# Patient Record
Sex: Male | Born: 1935 | Race: White | Hispanic: No | Smoking: Never smoker
Health system: Southern US, Community
[De-identification: ages and names within clinical notes are randomized; demographics above are authoritative.]

## PROBLEM LIST (undated history)

## (undated) DIAGNOSIS — F419 Anxiety disorder, unspecified: Secondary | ICD-10-CM

## (undated) DIAGNOSIS — K219 Gastro-esophageal reflux disease without esophagitis: Secondary | ICD-10-CM

## (undated) DIAGNOSIS — Z860100 Personal history of colon polyps, unspecified: Secondary | ICD-10-CM

## (undated) DIAGNOSIS — E079 Disorder of thyroid, unspecified: Secondary | ICD-10-CM

## (undated) DIAGNOSIS — L821 Other seborrheic keratosis: Secondary | ICD-10-CM

## (undated) DIAGNOSIS — H269 Unspecified cataract: Secondary | ICD-10-CM

## (undated) DIAGNOSIS — S329XXA Fracture of unspecified parts of lumbosacral spine and pelvis, initial encounter for closed fracture: Secondary | ICD-10-CM

## (undated) DIAGNOSIS — E039 Hypothyroidism, unspecified: Secondary | ICD-10-CM

## (undated) DIAGNOSIS — H9190 Unspecified hearing loss, unspecified ear: Secondary | ICD-10-CM

## (undated) DIAGNOSIS — E785 Hyperlipidemia, unspecified: Secondary | ICD-10-CM

## (undated) DIAGNOSIS — M1990 Unspecified osteoarthritis, unspecified site: Secondary | ICD-10-CM

## (undated) DIAGNOSIS — N400 Enlarged prostate without lower urinary tract symptoms: Secondary | ICD-10-CM

## (undated) DIAGNOSIS — H16139 Photokeratitis, unspecified eye: Secondary | ICD-10-CM

## (undated) HISTORY — DX: Photokeratitis, unspecified eye: H16.139

## (undated) HISTORY — DX: Hyperlipidemia, unspecified: E78.5

## (undated) HISTORY — DX: Personal history of colonic polyps: Z86.010

## (undated) HISTORY — DX: Disorder of thyroid, unspecified: E07.9

## (undated) HISTORY — PX: ANKLE SURGERY: SHX546

## (undated) HISTORY — PX: SHOULDER SURGERY: SHX246

## (undated) HISTORY — DX: Personal history of colon polyps, unspecified: Z86.0100

## (undated) HISTORY — DX: Unspecified cataract: H26.9

## (undated) HISTORY — DX: Other seborrheic keratosis: L82.1

## (undated) HISTORY — PX: ROTATOR CUFF REPAIR: SHX139

## (undated) HISTORY — PX: WRIST SURGERY: SHX841

## (undated) HISTORY — DX: Unspecified osteoarthritis, unspecified site: M19.90

## (undated) HISTORY — DX: Fracture of unspecified parts of lumbosacral spine and pelvis, initial encounter for closed fracture: S32.9XXA

## (undated) HISTORY — PX: BACK SURGERY: SHX140

## (undated) HISTORY — DX: Gastro-esophageal reflux disease without esophagitis: K21.9

## (undated) HISTORY — PX: CARPAL TUNNEL RELEASE: SHX101

---

## 2000-07-21 ENCOUNTER — Emergency Department (HOSPITAL_COMMUNITY): Admission: EM | Admit: 2000-07-21 | Discharge: 2000-07-22 | Payer: Self-pay | Admitting: Emergency Medicine

## 2000-07-22 ENCOUNTER — Encounter: Payer: Self-pay | Admitting: Emergency Medicine

## 2001-07-22 ENCOUNTER — Encounter: Payer: Self-pay | Admitting: Emergency Medicine

## 2001-07-22 ENCOUNTER — Emergency Department (HOSPITAL_COMMUNITY): Admission: EM | Admit: 2001-07-22 | Discharge: 2001-07-22 | Payer: Self-pay | Admitting: Emergency Medicine

## 2003-05-31 ENCOUNTER — Encounter: Admission: RE | Admit: 2003-05-31 | Discharge: 2003-06-08 | Payer: Self-pay | Admitting: Orthopedic Surgery

## 2003-10-10 ENCOUNTER — Ambulatory Visit (HOSPITAL_COMMUNITY): Admission: RE | Admit: 2003-10-10 | Discharge: 2003-10-10 | Payer: Self-pay | Admitting: Orthopedic Surgery

## 2003-10-10 ENCOUNTER — Ambulatory Visit (HOSPITAL_BASED_OUTPATIENT_CLINIC_OR_DEPARTMENT_OTHER): Admission: RE | Admit: 2003-10-10 | Discharge: 2003-10-10 | Payer: Self-pay | Admitting: Orthopedic Surgery

## 2003-10-13 ENCOUNTER — Ambulatory Visit (HOSPITAL_COMMUNITY): Admission: RE | Admit: 2003-10-13 | Discharge: 2003-10-13 | Payer: Self-pay | Admitting: Neurosurgery

## 2003-11-23 ENCOUNTER — Ambulatory Visit (HOSPITAL_BASED_OUTPATIENT_CLINIC_OR_DEPARTMENT_OTHER): Admission: RE | Admit: 2003-11-23 | Discharge: 2003-11-23 | Payer: Self-pay | Admitting: Orthopedic Surgery

## 2003-12-03 ENCOUNTER — Encounter: Admission: RE | Admit: 2003-12-03 | Discharge: 2004-02-20 | Payer: Self-pay | Admitting: Orthopedic Surgery

## 2004-06-10 ENCOUNTER — Encounter: Admission: RE | Admit: 2004-06-10 | Discharge: 2004-06-10 | Payer: Self-pay | Admitting: Neurosurgery

## 2004-06-26 ENCOUNTER — Encounter: Admission: RE | Admit: 2004-06-26 | Discharge: 2004-06-26 | Payer: Self-pay | Admitting: Neurosurgery

## 2004-07-09 ENCOUNTER — Encounter: Admission: RE | Admit: 2004-07-09 | Discharge: 2004-07-09 | Payer: Self-pay | Admitting: Neurosurgery

## 2004-09-21 ENCOUNTER — Ambulatory Visit (HOSPITAL_COMMUNITY): Admission: RE | Admit: 2004-09-21 | Discharge: 2004-09-21 | Payer: Self-pay | Admitting: Family Medicine

## 2004-10-22 ENCOUNTER — Ambulatory Visit: Payer: Self-pay | Admitting: Family Medicine

## 2004-11-04 ENCOUNTER — Encounter: Admission: RE | Admit: 2004-11-04 | Discharge: 2004-11-04 | Payer: Self-pay | Admitting: Orthopaedic Surgery

## 2004-11-04 ENCOUNTER — Ambulatory Visit (HOSPITAL_BASED_OUTPATIENT_CLINIC_OR_DEPARTMENT_OTHER): Admission: RE | Admit: 2004-11-04 | Discharge: 2004-11-04 | Payer: Self-pay | Admitting: Orthopaedic Surgery

## 2004-11-04 ENCOUNTER — Ambulatory Visit (HOSPITAL_COMMUNITY): Admission: RE | Admit: 2004-11-04 | Discharge: 2004-11-04 | Payer: Self-pay | Admitting: Orthopaedic Surgery

## 2004-11-17 ENCOUNTER — Encounter: Admission: RE | Admit: 2004-11-17 | Discharge: 2005-01-16 | Payer: Self-pay | Admitting: Orthopaedic Surgery

## 2005-01-22 ENCOUNTER — Ambulatory Visit: Payer: Self-pay | Admitting: Family Medicine

## 2005-01-23 ENCOUNTER — Ambulatory Visit (HOSPITAL_COMMUNITY): Admission: RE | Admit: 2005-01-23 | Discharge: 2005-01-23 | Payer: Self-pay | Admitting: Urology

## 2005-07-01 ENCOUNTER — Inpatient Hospital Stay (HOSPITAL_COMMUNITY): Admission: RE | Admit: 2005-07-01 | Discharge: 2005-07-02 | Payer: Self-pay | Admitting: Neurosurgery

## 2005-11-17 ENCOUNTER — Ambulatory Visit (HOSPITAL_COMMUNITY): Admission: RE | Admit: 2005-11-17 | Discharge: 2005-11-17 | Payer: Self-pay | Admitting: Orthopaedic Surgery

## 2005-11-17 ENCOUNTER — Ambulatory Visit (HOSPITAL_BASED_OUTPATIENT_CLINIC_OR_DEPARTMENT_OTHER): Admission: RE | Admit: 2005-11-17 | Discharge: 2005-11-18 | Payer: Self-pay | Admitting: Orthopaedic Surgery

## 2005-12-08 ENCOUNTER — Encounter: Admission: RE | Admit: 2005-12-08 | Discharge: 2006-01-06 | Payer: Self-pay | Admitting: Orthopaedic Surgery

## 2006-01-07 ENCOUNTER — Encounter: Admission: RE | Admit: 2006-01-07 | Discharge: 2006-02-04 | Payer: Self-pay | Admitting: Orthopaedic Surgery

## 2006-02-05 ENCOUNTER — Encounter: Admission: RE | Admit: 2006-02-05 | Discharge: 2006-02-26 | Payer: Self-pay | Admitting: Orthopaedic Surgery

## 2006-03-19 ENCOUNTER — Encounter: Admission: RE | Admit: 2006-03-19 | Discharge: 2006-03-19 | Payer: Self-pay | Admitting: Neurosurgery

## 2006-03-27 ENCOUNTER — Emergency Department (HOSPITAL_COMMUNITY): Admission: EM | Admit: 2006-03-27 | Discharge: 2006-03-27 | Payer: Self-pay | Admitting: Emergency Medicine

## 2006-04-01 ENCOUNTER — Encounter: Admission: RE | Admit: 2006-04-01 | Discharge: 2006-04-01 | Payer: Self-pay | Admitting: Neurosurgery

## 2006-04-06 ENCOUNTER — Ambulatory Visit (HOSPITAL_COMMUNITY): Admission: RE | Admit: 2006-04-06 | Discharge: 2006-04-07 | Payer: Self-pay | Admitting: Ophthalmology

## 2006-07-24 ENCOUNTER — Emergency Department (HOSPITAL_COMMUNITY): Admission: EM | Admit: 2006-07-24 | Discharge: 2006-07-24 | Payer: Self-pay | Admitting: Emergency Medicine

## 2006-10-21 ENCOUNTER — Emergency Department (HOSPITAL_COMMUNITY): Admission: EM | Admit: 2006-10-21 | Discharge: 2006-10-21 | Payer: Self-pay | Admitting: Emergency Medicine

## 2006-11-15 ENCOUNTER — Observation Stay (HOSPITAL_COMMUNITY): Admission: EM | Admit: 2006-11-15 | Discharge: 2006-11-16 | Payer: Self-pay | Admitting: Emergency Medicine

## 2006-11-15 ENCOUNTER — Ambulatory Visit: Payer: Self-pay | Admitting: Cardiology

## 2006-11-17 ENCOUNTER — Ambulatory Visit: Payer: Self-pay

## 2006-11-22 ENCOUNTER — Inpatient Hospital Stay (HOSPITAL_COMMUNITY): Admission: EM | Admit: 2006-11-22 | Discharge: 2006-11-24 | Payer: Self-pay | Admitting: Emergency Medicine

## 2006-11-22 ENCOUNTER — Ambulatory Visit: Payer: Self-pay | Admitting: Cardiology

## 2006-12-06 ENCOUNTER — Ambulatory Visit (HOSPITAL_COMMUNITY): Admission: RE | Admit: 2006-12-06 | Discharge: 2006-12-06 | Payer: Self-pay | Admitting: Neurosurgery

## 2007-01-13 ENCOUNTER — Ambulatory Visit (HOSPITAL_COMMUNITY): Admission: RE | Admit: 2007-01-13 | Discharge: 2007-01-13 | Payer: Self-pay | Admitting: Family Medicine

## 2007-01-27 ENCOUNTER — Ambulatory Visit (HOSPITAL_COMMUNITY): Admission: RE | Admit: 2007-01-27 | Discharge: 2007-01-27 | Payer: Self-pay | Admitting: Family Medicine

## 2007-01-31 ENCOUNTER — Encounter: Admission: RE | Admit: 2007-01-31 | Discharge: 2007-02-28 | Payer: Self-pay | Admitting: Neurosurgery

## 2007-02-09 ENCOUNTER — Ambulatory Visit: Payer: Self-pay | Admitting: Cardiology

## 2007-05-23 ENCOUNTER — Ambulatory Visit: Payer: Self-pay | Admitting: Cardiology

## 2007-06-06 ENCOUNTER — Ambulatory Visit: Payer: Self-pay

## 2007-06-06 ENCOUNTER — Encounter: Payer: Self-pay | Admitting: Cardiology

## 2007-08-01 ENCOUNTER — Ambulatory Visit (HOSPITAL_COMMUNITY): Admission: RE | Admit: 2007-08-01 | Discharge: 2007-08-01 | Payer: Self-pay | Admitting: Family Medicine

## 2007-08-15 ENCOUNTER — Ambulatory Visit: Payer: Self-pay | Admitting: Gastroenterology

## 2007-10-05 ENCOUNTER — Ambulatory Visit: Payer: Self-pay | Admitting: Gastroenterology

## 2008-01-26 ENCOUNTER — Encounter: Admission: RE | Admit: 2008-01-26 | Discharge: 2008-02-16 | Payer: Self-pay | Admitting: Orthopaedic Surgery

## 2008-12-06 DIAGNOSIS — Z8669 Personal history of other diseases of the nervous system and sense organs: Secondary | ICD-10-CM | POA: Insufficient documentation

## 2008-12-06 DIAGNOSIS — R109 Unspecified abdominal pain: Secondary | ICD-10-CM | POA: Insufficient documentation

## 2008-12-06 DIAGNOSIS — M1990 Unspecified osteoarthritis, unspecified site: Secondary | ICD-10-CM

## 2008-12-06 DIAGNOSIS — M549 Dorsalgia, unspecified: Secondary | ICD-10-CM | POA: Insufficient documentation

## 2008-12-06 HISTORY — DX: Personal history of other diseases of the nervous system and sense organs: Z86.69

## 2009-04-05 ENCOUNTER — Ambulatory Visit: Payer: Self-pay | Admitting: Vascular Surgery

## 2009-04-24 ENCOUNTER — Encounter: Admission: RE | Admit: 2009-04-24 | Discharge: 2009-04-24 | Payer: Self-pay | Admitting: Neurosurgery

## 2010-12-27 ENCOUNTER — Encounter: Payer: Self-pay | Admitting: Family Medicine

## 2011-03-19 ENCOUNTER — Encounter: Payer: Self-pay | Admitting: Physician Assistant

## 2011-03-19 DIAGNOSIS — N400 Enlarged prostate without lower urinary tract symptoms: Secondary | ICD-10-CM

## 2011-03-19 DIAGNOSIS — E785 Hyperlipidemia, unspecified: Secondary | ICD-10-CM | POA: Insufficient documentation

## 2011-03-19 DIAGNOSIS — K219 Gastro-esophageal reflux disease without esophagitis: Secondary | ICD-10-CM

## 2011-03-19 DIAGNOSIS — N319 Neuromuscular dysfunction of bladder, unspecified: Secondary | ICD-10-CM

## 2011-04-21 NOTE — Procedures (Signed)
LOWER EXTREMITY ARTERIAL EVALUATION-EXERCISE WORKSHEET   INDICATION:  Bilateral lower extremity claudication.   HISTORY:  Diabetes:  No.  Cardiac:  No.  Hypertension:  No.  Smoking:  No.  Previous Surgery:  No.   RESTING SYSTOLIC PRESSURES: (ABI)                                RIGHT       LEFT  Brachial:                     126         122  Thigh:  Anterior tibial at ankle:     148 (>1.0)  140 (>1.0)  Posterior tibial at ankle:    148         140  Peroneal at ankle:  DOPPLER WAVEFORM ANALYSIS:  Common femoral:  Popliteal:  Anterior tibial:              Triphasic   Triphasic  Posterior tibial:             Triphasic   Triphasic  Peroneal:   EXERCISE  Walking Time:  Five minutes  Speed:  Incline:  Ambulated In Hallway:  X   SYMPTOMS:  Bilateral calf tingling with 1 minute of exercise.  IMPRESSION:  1.  Resting ABI suggests no significant arterial occlusive  disease.  2.  Post exercise indices also were >1.0 bilaterally suggestive of no  significant lower extremity arterial occlusive disease.   POST-EXERCISE  RIGHT                      LEFT  128        156             immediately 164  152        134             2 min.      144             4 min.             6 min.             8 min.             10 min.       ___________________________________________  Quita Skye. Hart Rochester, M.D.   MC/MEDQ  D:  04/05/2009  T:  04/05/2009  Job:  [id number]

## 2011-04-21 NOTE — Assessment & Plan Note (Signed)
[facility]                            CARDIOLOGY OFFICE NOTE   JUBAL, RADEMAKER                     MRN:          [id number]  DATE:05/23/2007                            DOB:          [birth year]    PRIMARY CARE PHYSICIAN:  Dr. Molly Maduro Day.   REASON FOR PRESENTATION:  Evaluate patient with chest pain and mitral  regurgitation.   HISTORY OF PRESENT ILLNESS:  The patient presents for followup prior to  going back to [place].  He wanted to see me one last time prior to  going there for several months.  He has coronary disease,  nonobstructive, as described below.  He also has a vague history of  mitral regurgitation or other valvular abnormalities.  He has been  taking endocarditis prophylaxis for quite a while for this.  Currently,  he denies any symptoms.  He gets along very well.  He denies any chest  discomfort, neck discomfort, arm discomfort, activity-induced nausea,  vomiting, or excessive diaphoresis.  He has had no palpitations, no  presyncope or syncope.  He denies any PND or orthopnea.   PAST MEDICAL HISTORY:  1. Coronary artery disease, nonobstructive (catheterization, December      2007, LAD no obstructive disease, but 38%-48% proximal stenosis and      38% mid stenosis.  Circumflex had 20% stenosis.  The right coronary      artery 30% stenosis.  The EF was 55%-60%).  2. Dyslipidemia.  3. Peptic ulcer disease.  4. Degenerative joint disease.  5. Osteoarthritis.  6. Low back pain.  7. Rotator cuff repair.  8. Laminectomy.  9. Carpal tunnel surgery.   ALLERGIES:  None.   MEDICATIONS:  1. Aspirin 81 mg daily.  2. Gabapentin 600 mg b.i.d.  3. Vytorin 10/40 three times per week.  4. Protonix 40 mg daily.  5. Lisinopril 5 mg daily.  6. Celebrex 200 mg b.i.d.  7. Fish oil.  8. Flax seed oil.  9. Super B complex.   REVIEW OF SYSTEMS:  As stated in the HPI and otherwise negative for  other systems.   PHYSICAL  EXAMINATION:  Patient is in no distress.  Blood pressure 106/58, heart rate 80 and regular, weight 183 pounds.  HEENT:  Eyelids unremarkable, pupils are equal, round, and reactive to  light, fundi not visualized.  Oral mucosa unremarkable.  NECK:  No jugular venous distension at 45 degrees, carotid upstroke  brisk and symmetrical.  No bruit.  No thyromegaly.  LYMPHATICS:  No adenopathy.  LUNGS:  Clear to auscultation bilaterally.  BACK:  No costovertebral angle tenderness.  CHEST:  Unremarkable.  HEART:  PMI not displaced or sustained.  S1 and S2 are within normal  limits.  No S3, no S4.  No clicks, no rubs, no murmurs.  ABDOMEN:  Flat, positive bowel sounds, normal in frequency and pitch.  No bruits, no rebound, no guarding.  No midline pulsatile mass.  No  hepatomegaly, no splenomegaly.  SKIN:  No rashes, no nodules.  EXTREMITIES:  2+ pulses throughout, no edema.  No cyanosis, no clubbing.  NEURO:  Oriented  to person, place, and time.  Cranial nerves II-XII  grossly intact, motor grossly intact.   ASSESSMENT AND PLAN:  1. Coronary disease.  The patient is having no symptoms.  He will      continue with primary risk reduction.  2. Mitral regurgitation.  I do not hear any murmur.  He was to get an      echo, but this never happened after the last visit.  I am going to      arrange for this to happen to clarify whether he has this or not.      He would not need endocarditis prophylaxis based on current data      and guidelines.  3. Followup could be back in this clinic as needed, and based on the      results of the echo.     Rollene Rotunda, MD, [facility]  Electronically Signed    JH/MedQ  DD: 05/23/2007  DT: 05/24/2007  Job #: [id number]   cc:   Alfredia Client, MD

## 2011-04-21 NOTE — Assessment & Plan Note (Signed)
NAME:  Michael Mcclure, Michael Mcclure               CHART#:  [id number]   DATE:  08/15/2007                       DOB:  06-26-1932   PRIMARY PHYSICIAN:  Monica Becton, M.D.   REASON FOR VISIT:  Abdominal pain.   HISTORY OF PRESENT ILLNESS:  Michael Mcclure is a 77 year old male who has  been having pain in his right upper and right lower quadrant since the  beginning of the year.  He describes it as a burning sensation.  It is  not really related to eating.  It does not move anywhere.  He did have  some increase in pain with pizza and only pizza.  He has a bowel  movement once a day, and half the time he has to strain to push the  stool out.  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  He is active even in the wintertime.  He denies any blood in  his urine, and he attributes any discomfort with urination to his  pinched nerve in his back.  Sometimes, the pain keeps him awake, but it  is not a bad pain.  He denies it being knife-like, twisting, or  crampy.  He has had no nausea, vomiting, or blood in his stool.  Sometimes he is sitting around, and he feels the pain.  It can be worse  with movement.  He walks 1-1/2 to 2 miles daily.  The pain in always in  the same place.  He denies any diarrhea or loose stool.  He reports  having a colonoscopy at [facility] in [place], [place]  [place], and they told him everything was alright.   PAST MEDICAL HISTORY:  1. Back pain.  2. Arthritis.   PAST SURGICAL HISTORY:  1. Rotator cuff surgery.  2. Back surgery last year.  3. Carpal tunnel syndrome surgery x2.   ALLERGIES:  No known drug allergies.   MEDICATIONS:  1. Neurontin 600 mg twice daily.  2. Protonix 40 mg daily.  3. Tylenol Arthritis twice daily.  4. Aspirin 81 mg q.h.s.  5. Flax oil.  6. Fish oil.  7. Vytorin 10/40.   FAMILY HISTORY:  His mother had colon cancer after the age of 21  and  lived to be 64.   SOCIAL HISTORY:  He is separated for the last 8-9 years.  He is visiting  his son and grandchildren from [place].  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  He denies any tobacco or alcohol  use.   REVIEW OF SYSTEMS:  Per the HPI.  Otherwise, all systems are negative.   PHYSICAL EXAM:  Weight 185 pounds, height 5 feet 11 inches, temperature  97.7, blood pressure 120/68, pulse 72.  GENERAL:  He is in no apparent distress, alert and oriented x4.  HEENT:  Atraumatic, normocephalic, pupils equal and reactive to light.  Mouth no oral lesions.  Posterior pharynx without erythema or exudate.  NECK:  Full range of motion, no lymphadenopathy.  LUNGS:  Clear to auscultation bilaterally.  CARDIOVASCULAR:  Regular rhythm, no murmur, normal S1 and S2.  ABDOMEN:  Bowel sounds are present, soft, nondistended.  He has  tenderness to palpation in the right periumbilical region which  reproduces his complaint.  It is unchanged with Carnett's maneuvers.  He  has a visible bulge in his abdomen, which decreases with relaxation of  his Valsalva maneuvers.  The bulge is nontender.  EXTREMITIES:  Without cyanosis, clubbing, or edema.  NEURO:  He has no focal neurologic deficits.   RADIOGRAPHIC STUDIES:  1. Abdominal ultrasound shows a normal gallbladder without stones or      wall thickening.  Bile duct is 3 mm.  His pancreas is normal.  He      had no sonographic abnormalities identified in his right lower      quadrant to suggest a hernia.  I discussed with the reviewing      physician Dr. Ulyses Southward.  2. CT scan of the abdomen and pelvis with contrast.  It was negative      for any mass, adenopathy, bowel abnormalities, or abnormalities of      the appendix.  He did have atherosclerotic changes in a nondilated      abdominal aorta.   ASSESSMENT:  Michael Mcclure is a 77 year old male with chronic right  periumbilical abdominal pain associated with a  midline bulge likely  secondary to separation of his rectus sheath muscles due to a lifetime  history of manual labor.  The differential diagnosis includes abdominal  wall pain and a low likelihood of mesenteric ischemia.  He has a low  likelihood as well of having a biliary source for his periumbilical  pain.  I will also make recommendations to help him with his mild  constipation. Thanks for allowing me to see Michael Mcclure in  consultation.  My recommendations follow.   RECOMMENDATIONS:  1. He is given his instructions for his abdominal pain and hard bowel      movements in writing.  2. He is asked to add ibuprofen 1 twice a day x10 days.  3. He is asked to apply heat 3 times a day to his abdominal wall.  He      may use Tylenol Arthritis for additional pain if needed.  4. For his hard bowel movements, he is asked to drink at least 6-8      cups of coffee, water, or juice per day.  He should follow a high      fiber diet.  He is given a handout on a high fiber diet.      He should also add stool softeners 2 times a day to achieve 1 soft      bowel movement daily.  5. He has a return patient visit to see me in 1 month.       Kassie Mends, M.D.  Electronically Signed     SM/MEDQ  D:  08/15/2007  T:  08/16/2007  Job:  [id number]   cc:   Ernestina Penna, M.D.

## 2011-04-21 NOTE — Assessment & Plan Note (Signed)
NAME:  Michael Mcclure, Michael Mcclure               CHART#:  [id number]   DATE:  10/05/2007                       DOB:  13-May-1932   REFERRING PHYSICIAN:  Monica Becton.   PROBLEM LIST:  1. Chronic abdominal pain.  2. Back pain.  3. Arthritis.  4. Carpal tunnel surgery.   SUBJECTIVE:  Michael Mcclure is a 77 year old male who presents with  continued abdominal pain.  Michael Mcclure has had several episodes of burning in his  right periumbilical area, more up into the right upper quadrant.  Michael Mcclure  said the ibuprofen did not work.  It is a pinpoint area and the pain has  not moved anywhere.  Is a burning sensation.  It is not associated with  fever, nausea or vomiting.  Did have an episode of indigestion on  Saturday with pizza and spaghetti.  His bowel movements have become more  regular with FiberOne, prunes and raisins.  The pain is random in onset.  Sometimes it hurts more than others.  Sometimes Michael Mcclure does not have the  pain at all.  Michael Mcclure has not had any weight loss.  His appetite is good.  Eating does not really bring on the pain.  Walking around can start the  pain.  Michael Mcclure does several repetitive activities during the daytime such as  cutting grass, cutting out tree stumps and garden work.  Michael Mcclure has also  recently picked a half a bushel of beans.  Michael Mcclure notices it more when Michael Mcclure is  sitting than when Michael Mcclure is moving around.  Michael Mcclure states his bowel problem is  fixed but his pain is the same.   MEDICATIONS:  1. Neurontin 600 mg b.i.d.  2. Protonix 40 mg daily.  3. Tylenol twice a day.  4. Aspirin 81 mg at nighttime.  5. Flax seed oil.  6. Fish oil.  7. Vytorin 10/40 daily.   OBJECTIVE:  Weight 187 pounds (up 2 pounds since September 2008), height  5 foot 11, BMI 21.1 (slightly overweight), temperature 98.5, blood  pressure 136/76, pulse 60.  GENERAL:  Michael Mcclure is in no apparent distress, alert and oriented x4.  LUNGS:  Clear to auscultation bilaterally.  CARDIOVASCULAR:  Regular rhythm, no murmur, normal S1 and S2.  ABDOMEN:  Bowel sounds are present, soft, pinpoint tenderness in the  right upper quadrant just lateral to his umbilicus.  This is the pain  that initiated at visit.  Michael Mcclure has a midline bulge with raising his legs  off the exam table.  Bulge reduces once Michael Mcclure no longer Valsalva's.  Michael Mcclure has  no rebound or guarding.  The remaining abdominal exam is benign.  NEURO:  Michael Mcclure has no focal neurologic deficits.   ASSESSMENT:  Mr. Rodas is a 77 year old male who continues to  complain of persistent burning in his right upper quadrant.  Michael Mcclure had an  ileocolonoscopy in [place] in April of 2008 and lab evaluation to  include CBC and comprehensive metabolic panel which was absolutely  normal.  Michael Mcclure had an ultrasound in August 2008 which showed a normal  gallbladder and common bile duct.  Michael Mcclure had a CT scan in February 2008  performed with intravenous contrast for abdominal pain and again his  liver, spleen, gallbladder and pancreas were normal.  His colon was  normal.  The differential diagnosis for his abdominal pain includes  musculoskeletal abdominal wall pain, and a low likelihood of abdominal  pain secondary to a hernia in this location, or referred pain from disk  disease.  Thank you for allowing me to see Mr. Scribner in consultation.  My recommendations follow.   RECOMMENDATIONS:  1. Have given him a referral to physical therapy so that Michael Mcclure can have      iontophoresis with 0.40% dexamethasone.  If his abdominal pain      continues then could consider MRI of the spine to look for disk      disease.  2. A follow up appointment to see me in 8 weeks.       Kassie Mends, M.D.  Electronically Signed     SM/MEDQ  D:  10/05/2007  T:  10/05/2007  Job:  [id number]   cc:   Ernestina Penna, M.D.

## 2011-04-24 NOTE — H&P (Signed)
NAME:  Michael Mcclure, Michael Mcclure NO.:  [id number]   MEDICAL RECORD NO.:  [id number]          PATIENT TYPE:  OBV   LOCATION:  1823                         FACILITY:  [facility]   PHYSICIAN:  Lowella Bandy, MD      DATE OF BIRTH:  Aug 19, 1932   DATE OF ADMISSION:  11/15/2006  DATE OF DISCHARGE:                              HISTORY & PHYSICAL   PRIMARY CARE PHYSICIAN:  Magnus Sinning. Rice, M.D. locally; Dr. Kalman Jewels  (question spelling) in [place] at [phone].   PRIMARY CARDIOLOGIST:  Dr. Antony Blackbird in [place] at [phone].   CHIEF COMPLAINT:  Shortness of breath.   HISTORY OF PRESENT ILLNESS:  Michael Mcclure is a 77 year old male with  hyperlipidemia but no known history of obstructive coronary artery  disease who has been having periods of shortness of breath for the past  few months.  These episodes last just a few seconds at a time and occur  randomly, and are not related in any way that he can determine to  exertion.  They often happen while he is sitting watching television.  He also occasionally has sharp left-sided chest pain.  The chest pain is  not exertional and also lasts just a few seconds before resolving.  He  has been in the [place] area visiting his son, and he has had a few  of these episodes of shortness of breath while here. He called Dr.  Montey Hora and was instructed to present to the emergency department  for further evaluation by [facility].   PAST MEDICAL HISTORY:  1. Hyperlipidemia.  2. Peptic ulcer disease.  3. DJD.  4. Osteoarthritis.  5. Low back pain due to pinched nerve.  6. Status post right rotator cuff repair.  7. Status post laminectomy in July 2006.  8. Carpal tunnel syndrome.   PAST CARDIAC STUDIES:  Reports that he had cardiac catheterization in  [place], [place] approximately 15 years ago.  He had an  exercise stress echocardiogram in [place] earlier this year, he  believes in May or June  2007.   MEDICATIONS:  1. Aspirin 81 mg p.o. once a day.  2. Vitorin 10/40 once a day.  3. Gabapentin 600 mg b.i.d.  4. Clarinex as needed.  5. Protonix 40 mg p.o. once a day.  6. Lisinopril 5 mg p.o. once a day.  7. Sulindac 200 mg p.o. once a day.  8. Fish oil.  9. Nitroglycerin 0.4 mg sublingually p.r.n. prescribed by Dr.      Antony Blackbird, although no known history of obstructive coronary artery      disease.   ALLERGIES:  No known drug allergies.   SOCIAL HISTORY:  He lives in [place].  He is a retired Forensic scientist.  He denies any history of tobacco use.  He also denies alcohol or illicit  drug use.  He exercises frequently and walks up to a mile without  difficulty.   FAMILY HISTORY:  Mother died at age 98 from cancer.  Father died at age  55 from complications of congestive heart failure.  He had his  first MI  in his 53s.   REVIEW OF SYSTEMS:  Positive for hearing loss for which he wears hearing  aids. The patient also has dentures.  Positive for occasional cough,  dysuria, arthralgias, and back pain.  Otherwise, 10 systems reviewed and  negative other than as noted above in the HPI.   PHYSICAL EXAMINATION:  VITAL SIGNS: Blood pressure 117/63, pulse 80,  respirations 18, oxygen saturation 96% on room air. Temperature 97.5.  GENERAL:  The patient is well appearing, breathing in no apparent  distress.  HEENT:  Normocephalic and atraumatic.  Sclerae anicteric.  Oropharynx  clear.  Dentures in place.  NECK: Supple.  No masses appreciated.  No JVD or carotid bruits.  CARDIOVASCULAR:  Regular rate and rhythm.  Normal S1 and S2. No murmurs,  rubs, or gallops appreciated.  CHEST: Clear to auscultation bilaterally.  ABDOMEN: Soft, nontender, nondistended.  Normal bowel sounds.  EXTREMITIES:  Warm, no edema.  SKIN:  No rashes.  MUSCULOSKELETAL: No joint effusions or erythema.  NEUROLOGIC: Alert and oriented x3.  Cranial nerves grossly intact.  Moving all extremities well.    EKG, which I personally reviewed, shows sinus rhythm at 80 beats per  minute, normal EKG.   Laboratory values pending.   Chest x-ray pending.   ASSESSMENT AND PLAN:  [name] male with cardiac risk factors of  hyperlipidemia presenting with atypical chest pain and shortness of  breath, with a normal electrocardiogram. Cardiac enzymes are still  pending at this point.  Likelihood of acute coronary syndrome appears  low at this point.   1. We will admit him for observation to rule out myocardial      infarction. We will place him on 325 mg of aspirin and continue his      statin therapy. We will not heparinize him given the low      probability of acute coronary syndrome unless we see EKG changes or      elevation in cardiac enzymes.   1. We will attempt to obtain records from his prior stress test      earlier this year in [place] and, if possible, his prior      catheterization records as well.   1. If he rules out for MI, he likely could be discharged tomorrow      morning, either with outpatient followup here or back at home in      [place].      Lowella Bandy, MD  Electronically Signed     JJC/MEDQ  D:  11/15/2006  T:  11/15/2006  Job:  [id number]

## 2011-04-24 NOTE — Op Note (Signed)
NAME:  Michael Mcclure, Michael Mcclure              ACCOUNT NO.:  [id number]   MEDICAL RECORD NO.:  [id number]          PATIENT TYPE:  OIB   LOCATION:  5707                         FACILITY:  [facility]   PHYSICIAN:  Beulah Gandy. Ashley Royalty, M.D. DATE OF BIRTH:  1932/06/26   DATE OF PROCEDURE:  04/06/2006  DATE OF DISCHARGE:                                 OPERATIVE REPORT   PREOPERATIVE DIAGNOSIS:  Retinal break, vitreous hemorrhage, and posterior  retinal membranes.   POSTOPERATIVE DIAGNOSIS:  Retinal break, vitreous hemorrhage, and posterior  retinal membranes.   PROCEDURES:  Pars plana vitrectomy with membrane peel, gas fluid exchange,  retinal photocoagulation, left eye.   SURGEON:  Beulah Gandy. Ashley Royalty, M.D.   ASSISTANT:  Rosalie Doctor, MA   ANESTHESIA:  General.   DETAILS:  Usual prep and drape, 25 gauge trocars at 10, 2, and 4 o'clock.  Infusion port at 4 o'clock.  Provisc placed on the corneal surface.  The  pars plana vitrectomy was begun just behind the pseudophakos.  Blood was  encountered and this was carefully removed under low suction and rapid  cutting.  Blood was vacuumed from the retinal surface.  The vitrectomy was  carried out to the periphery where membranes were seen near the vitreous  base.  These were trimmed and peeled with the lighted vitreous pick and the  cutter. The retinal break was seen at 11 o'clock and the vitreous membranes  were attached to the edge of the break.  These membranes were freed with the  vitreous cutter and the pick.  Once this was accomplished, all blood was  again vacuumed from the surface of the retina.  The endolaser was positioned  in the eye, 394 burns were placed around the retinal break and around the  retinal periphery.  The power was 1000 milliwatts, 1000 microns each, and  0.1 seconds each.  The gas fluid exchange was carried out with room air.  The instruments were removed from the eye and wounds were held until tight.  Polymyxin and gentamicin were  irrigated into tenon's space.  Marcaine was  injected around the globe for postop pain.  TobraDex ophthalmic ointment was  placed as well as a patch and shield.  Closing pressure was 10 mm with a  Banker.  Complications none.  Duration 30 minutes.      Beulah Gandy. Ashley Royalty, M.D.  Electronically Signed     JDM/MEDQ  D:  04/06/2006  T:  04/06/2006  Job:  [id number]

## 2011-04-24 NOTE — Cardiovascular Report (Signed)
NAME:  Michael Mcclure, Michael Mcclure NO.:  [id number]   MEDICAL RECORD NO.:  [id number]          PATIENT TYPE:  INP   LOCATION:  3729                         FACILITY:  [facility]   PHYSICIAN:  Jonelle Sidle, MD DATE OF BIRTH:  [birth year]   DATE OF PROCEDURE:  DATE OF DISCHARGE:                            CARDIAC CATHETERIZATION   CARDIOLOGIST:  Dr. Shawnie Pons.   PRIMARY CARE PHYSICIAN:  Dr. Montey Hora.   INDICATIONS:  Mr. Aldaco is a 77 year old male with a history of  hyperlipidemia and recent admission to the hospital for observation,  having presented with chest discomfort.  He ruled out for myocardial  infarction and was scheduled for an outpatient followup Myoview,  which  was performed on the 12th of December.  This study report an ejection  fraction of 70% with a reversible inferior wall defect suggesting  ischemia.  In the interim, he re-presented to the hospital with  recurrent chest pain and is referred now for diagnostic cardiac  catheterization for clear definition of the coronary anatomy.  His  cardiac markers have been normal.  The potential risks and benefits were  explained to the patient in advance and informed consent was obtained.   PROCEDURES PERFORMED:  1. Left heart catheterization.  2. Selective coronary angiography.  3. Left ventriculography.   ACCESS AND EQUIPMENT:  The area about the right femoral artery was  anesthetized with 1% lidocaine and a 6-French sheath was placed in the  right femoral artery via the modified Seldinger technique.  Standard  preformed 6-French JL-4 and JR-4 catheters were used for selective  coronary angiography and an angled pigtail catheters was used for left  heart catheterization and left ventriculography.  A total of 100 mL of  Omnipaque were used.  All exchanges were made over a wire and the  patient tolerated the procedure well without immediate complications.   HEMODYNAMIC RESULTS:  Aorta 127/53 mmHg.   Left ventricle 125/17 mmHg.   ANGIOGRAPHIC FINDINGS:  1. Left main coronary artery is relatively long and gives rise to the      left anterior descending and circumflex coronary arteries.  There      are no significant flow-limiting stenoses noted.  2. The left anterior descending is a medium-caliber vessel with      essentially 1 large proximal bifurcating diagonal.  There are      luminal irregularities noted throughout the vessel, although no      obstructive stenoses are noted.  There is approximately 30% to 40%      stenosis in the proximal vessel and 30% diffuse stenosis in the mid-      vessel.  3. The circumflex coronary artery has a relatively small AV groove      branch with larger branching obtuse marginal.  There are minor      luminal irregularities including a 20% stenosis in the proximal      vessel.  4. The right coronary artery is a medium-caliber dominant vessel with      relatively small posterior descending branch.  There are 2 right  ventricular marginal branches also noted.  Fairly diffuse mild      plaquing is noted including a 30% proximal stenosis that is      eccentric and 20% stenoses in the mid and distal vessel.   LEFT VENTRICULOGRAPHY:  Performed in the RAO projection and revealed an  ejection fraction of approximately 55% to 60% with no significant mitral  regurgitation.   DIAGNOSES:  1. Mild coronary atherosclerosis as outlined above with no major flow-      limiting stenoses.  2. Left ventricular ejection fraction of approximately 55% to 60% with      no significant mitral regurgitation and a left ventricle end-      diastolic pressure of 17 mmHg.   DISCUSSION:  I reviewed the results with Dr. Riley Kill by phone as well as  with the patient and his son.  At this point, it would seem that the  patient's symptoms are not related to coronary insufficiency, based on  the present anatomy.  Our plan will be for a CT scan of the chest later  today to  exclude other possible intrathoracic causes, although I doubt  pulmonary embolus or dissection at this point.  The patient will also  ambulate and if he does well, potentially be discharged to have close  outpatient followup with Dr. Riley Kill.      Jonelle Sidle, MD     SGM/MEDQ  D:  11/23/2006  T:  11/23/2006  Job:  [id number]   cc:   Arturo Morton. Riley Kill, MD, [facility]  Magnus Sinning. Rice, M.D.

## 2011-04-24 NOTE — Op Note (Signed)
NAME:  Michael Mcclure, Michael Mcclure NO.:  [id number]   MEDICAL RECORD NO.:  [id number]          PATIENT TYPE:  INP   LOCATION:  2899                         FACILITY:  [facility]   PHYSICIAN:  Payton Doughty, M.D.      DATE OF BIRTH:  1932-06-21   DATE OF PROCEDURE:  07/01/2005  DATE OF DISCHARGE:                                 OPERATIVE REPORT   PREOPERATIVE DIAGNOSIS:  Left L4-5 synovial cyst.   POSTOPERATIVE DIAGNOSIS:  Left L4-5 spondylosis.   OPERATION PERFORMED:  Left L4-5 laminectomy, foraminotomy.   SURGEON:  Payton Doughty, M.D.   ANESTHESIA:  General.   COMPLICATIONS:  None.   INDICATIONS FOR PROCEDURE:  The patient is a 77 year old gentleman with  severe left leg pain.   DESCRIPTION OF PROCEDURE:  The patient was taken to the operating room,  smoothly anesthetized, intubated, and placed prone on the operating table.  Following shave, prep and drape in the usual sterile fashion, the skin was  infiltrated with 1% lidocaine, 1:400,000 epinephrine.  Localizing x-ray was  made and an incision was carried out over the L4 lamina on the left side.  The lamina was dissected free.  Second x-ray confirmed correctness of the  level.  Using the high speed drill, Kerrison and Leksell instrumentation,  hemisemilaminectomy was carried out to the top of the ligamentum flavum.  There was a large amount of redundant tissue extruded from the 4-5 joint  that was encumbering the 5 root as it traversed this area and extended out  into the neural foramen.  This was carefully dissected free from the dura  and removed.  It did not appear to be disk but rather, contiguous with the  joint space although there was no frank cyst identified.  Following  decompression of the 5 root, it was carefully explored all the way out the  neural foramen and found to be free.  The incision was irrigated and  hemostasis assured.  A Depo-Medrol soak fat was placed in the laminectomy  defect.  The fascia and  subcutaneous tissues were reapproximated with 0  Vicryl in interrupted fashion.  Subcuticular tissues were reapproximated  with 3-0 Vicryl in interrupted fashion.  Skin was closed with 3-0 nylon in  running locked fashion.  Betadine Telfa dressing was applied and made  occlusive with OpSite and the patient returned to recovery room in good  condition.       MWR/MEDQ  D:  07/01/2005  T:  07/01/2005  Job:  [id number]

## 2011-04-24 NOTE — Op Note (Signed)
   NAME:  Michael Mcclure, Michael Mcclure                          ACCOUNT NO.:  [id number]   MEDICAL RECORD NO.:  [id number]                   PATIENT TYPE:  AMB   LOCATION:  DSC                                  FACILITY:  [facility]   PHYSICIAN:  Artist Pais. Mina Marble, M.D.           DATE OF BIRTH:  1932-06-25   DATE OF PROCEDURE:  10/10/2003  DATE OF DISCHARGE:                                 OPERATIVE REPORT   PREOPERATIVE DIAGNOSIS:  Left thumb stenosing tenosynovitis.   POSTOPERATIVE DIAGNOSIS:  Left thumb stenosing tenosynovitis.   PROCEDURE:  Release of left thumb A-1 pulley.   SURGEON:  Artist Pais. Mina Marble, M.D.   ASSISTANT:  Aura Fey. Bobbe Medico.   ANESTHESIA:  General.   TOURNIQUET TIME:  Eleven minutes.   COMPLICATIONS:  No complications.   DRAINS:  No drains.   OPERATIVE REPORT:  The patient was taken to the operating room and after the  induction of adequate general anesthesia, the left upper extremity was  prepped and draped in the usual sterile fashion.  An Esmarch was used to  exsanguinate the limb.  The tourniquet was then inflated to 250 mmHg.  At  this point in time, a transverse incision was made in the MP flexion crease  of the left thumb, 2 cm in length.  Incision was taken down through the skin  and subcutaneous tissues.  Neurovascular bundles were identified and  retracted.  The A-1 pulley was cut with a 15 blade, thus freeing up the FPL  tendon. The FPL tendon was lysed of all adhesions.  The wound was then  thoroughly irrigated and loosely closed with 5-0 nylon in horizontal  mattress suture.  A sterile dressing of Xeroform, 4 x 4's and a compressive  Coban wrap were applied.  The patient tolerated the procedure well and went  to recovery room in stable fashion.                                               Artist Pais Mina Marble, M.D.    MAW/MEDQ  D:  10/10/2003  T:  10/10/2003  Job:  [id number]

## 2011-04-24 NOTE — Op Note (Signed)
NAME:  Michael Mcclure, Michael Mcclure                          ACCOUNT NO.:  [id number]   MEDICAL RECORD NO.:  [id number]                   PATIENT TYPE:  AMB   LOCATION:  DSC                                  FACILITY:  [facility]   PHYSICIAN:  Thera Flake., M.D.             DATE OF BIRTH:  [birth year]   DATE OF PROCEDURE:  11/23/2003  DATE OF DISCHARGE:  11/23/2003                                 OPERATIVE REPORT   PREOPERATIVE DIAGNOSES:  1. Partial rotator cuff tear of left shoulder.  2. Impingement.  3. Acromioclavicular arthritis.  4. Degenerative tear in anterior superior labrum.   POSTOPERATIVE DIAGNOSES:  1. Partial rotator cuff tear of left shoulder.  2. Impingement.  3. Acromioclavicular arthritis.  4. Degenerative tear in anterior superior  labrum.   OPERATION:  1. Arthroscopic acromioplasty.  2. Arthroscopic debridement torn labrum.  3. Arthroscopic debridement rotator cuff.  4. Arthroscopic distal clavicle excision.   SURGEON:  Dyke Brackett, M.D.   ASSISTANT:  Madilyn Fireman, P.A.-C.   INDICATIONS FOR PROCEDURE:  The patient is a 77 year old male with MRI  suggesting complete cuff  tear, thought to be amenable to  overnight  hospitalization.   DESCRIPTION OF PROCEDURE:  Exam under anesthesia showed no loss of motion  and no instability. He was arthroscoped through a posterolateral and  anterior portal. Systematic inspection of the glenohumeral joint showed  degenerative tearing of the anterior labrum without any instability. This  was aggressively debrided. There was no glenohumeral arthritis. The biceps  tendon anchor was intact. Extensive partial tear of the supraspinatus, again  no full thickness tear was seen. This was probably about a 50% tear of the  whole thickness of the supraspinatus over its entire diameter. In view of  the absence of the full thickness  tear, the shoulder was not opened.   There was moderate impingement in the subacromial space with bursitis.  Complete bursectomy was carried out both in the lateral and anterior  posterior  portals. The leading edge of the acromion was resected of about 8  to 9 mm of bone, tapering the resection line posteriorly. The distal  centimeter of the clavicle itself was severely arthritic and hypertrophied.  There was significant impingement from the [facility] joint which was debrided as  well and excised. The superior surface of the cuff did not show any full  thickness lesion. The resection line was checked both from the anterior and  lateral approach. A complete bursectomy was carried out.   The shoulder was drained free of fluid. The portals were closed with nylon  and then Marcaine was used in light of the fact that the patient had had a  preoperative block. Lightly compressive dressing and a sling were applied.  The patient was taken to the recovery room in stable condition.  Thera Flake., M.D.    WDC/MEDQ  D:  11/23/2003  T:  11/25/2003  Job:  [id number]

## 2011-04-24 NOTE — Discharge Summary (Signed)
NAME:  Michael Mcclure, Michael Mcclure              ACCOUNT NO.:  [id number]   MEDICAL RECORD NO.:  [id number]          PATIENT TYPE:  INP   LOCATION:  3729                         FACILITY:  [facility]   PHYSICIAN:  Arturo Morton. Michael Kill, MD, FACCDATE OF BIRTH:  [birth year]   DATE OF ADMISSION:  11/22/2006  DATE OF DISCHARGE:  11/24/2006                               DISCHARGE SUMMARY   PRIMARY CARE PHYSICIAN:  Dr. Montey Hora, in [place].   PRIMARY CARDIOLOGIST:  Dr. Riley Mcclure.  Primary cardiologist in [place] is a Dr. Antony Mcclure, phone number [phone].   PRINCIPAL DIAGNOSIS:  Chest pain.   SECONDARY DIAGNOSES:  1. Nonobstructive coronary artery disease.  2. Hyperlipidemia.  3. Peptic ulcer disease.  4. Degenerative joint disease.  5. Osteoarthritis.  6. Lower back pain secondary to pinched nerve.  7. Status post right rotator cuff repair.  8. Status post laminectomy in July of 2006.  9. Carpal tunnel syndrome, status post surgical repair.   ALLERGIES:  NO KNOWN DRUG ALLERGIES.   PROCEDURES:  Left heart cardiac catheterization and CT of the chest to  rule out PE and dissection.   HISTORY OF PRESENT ILLNESS:  [age]-year-old white male with no  prior history of coronary artery disease.  He was recently admitted to  [facility] from December 10 to November 16, 2006 with  complaints of shortness of breath and chest pain.  He was subsequently  ruled out for MI and was discharged home and followed up with an  exercise Myoview in the office on November 17, 2006 revealing inferior  ischemia with an EF of 70%.  He was suppose to see Dr. Riley Mcclure on the  afternoon of December 17; however, that morning awoke with 2-3/10 left  chest aching without associated symptoms, which resolved spontaneously  and then later in the morning had recurrent exertional chest discomfort  while walking with associated jaw pain, tongue thickness and shortness  of breath, eventually relieved with sublingual  nitroglycerin.  Because  of his recurrent symptoms, he presented to the ER for evaluation.  In  the ED, his EKG was without any acute changes and he was admitted for  further evaluation.   HOSPITAL COURSE:  Mr. [name] ruled out for MI and was scheduled for a  left heart cardiac catheterization in light of his symptoms, along with  abnormal Myoview.  Cardiac catheterization was carried out on November 23, 2006, revealing diffuse nonobstructive coronary disease throughout  both the left and right coronary tree with an EF of 55%.  He continued  to complain of mild discomfort and, therefore, a CT of the chest was  ordered and revealed no evidence of aortic dissection or pulmonary  embolism.  He has been ambulating this afternoon without recurrent pain.  He is being discharged home today in satisfactory condition.   DISCHARGE LABS:  Hemoglobin 14.1, hematocrit 40.7, WBC 5.2, platelets  189, sodium 139, potassium 4.4, chloride 107, CO2 29, BUN 22, creatinine  1.3, glucose 104, total bilirubin 0.8, alkaline phosphatase 58, AST 24,  ALT 19, total protein 5.8, albumin 3.4, calcium 9.3.  CK  104, MB 1.6,  troponin-I 0.01.   DISPOSITION:  The patient is being discharged home today in good  condition.   FOLLOWUP PLANS AND APPOINTMENTS:  He is asked to follow up with Dr.  Rosalyn Charters PA on December 14, 2006 at 10:15 a.m.  He is asked to follow up  with Dr. Dimple Mcclure as previously scheduled.   DISCHARGE MEDICATIONS:  1. Aspirin 81 mg daily.  2. Gabapentin 600 mg b.i.d.  3. Vytorin 10/40 mg daily.  4. Clarinex 10 mg daily.  5. Protonix 40 mg daily.  6. Lisinopril 5 mg daily.  7. Sulindac 200 mg daily.   OUTSTANDING LAB STUDIES:  None.   DURATION DISCHARGE ENCOUNTER:  Forty-five minutes including physician  time.      Michael Mcclure, ANP      Arturo Morton. Michael Kill, MD, [facility]  Electronically Signed    CB/MEDQ  D:  11/24/2006  T:  11/25/2006  Job:  [id number]   cc:   Magnus Sinning. Rice, M.D.

## 2011-04-24 NOTE — Op Note (Signed)
NAME:  Michael Mcclure, Michael Mcclure              ACCOUNT NO.:  [id number]   MEDICAL RECORD NO.:  [id number]          PATIENT TYPE:  AMB   LOCATION:  DSC                          FACILITY:  [facility]   PHYSICIAN:  Claude Manges. Whitfield, M.D.DATE OF BIRTH:  Apr 25, 1932   DATE OF PROCEDURE:  11/04/2004  DATE OF DISCHARGE:                                 OPERATIVE REPORT   PREOPERATIVE DIAGNOSES:  1.  Retracted rotator cuff tear, right shoulder, with impingement.  2.  Degenerative joint disease, acromioclavicular joint.   POSTOPERATIVE DIAGNOSES:  1.  Retracted rotator cuff tear, right shoulder, with impingement.  2.  Degenerative joint disease, acromioclavicular joint.  3.  Partial tearing of the anterior glenoid labrum.   PROCEDURES:  1.  Arthroscopic debridement, right shoulder.  2.  Arthroscopic subacromial decompression.  3.  Arthroscopic distal clavicle resection.  4.  Mini-open rotator cuff tear repair.   SURGEON:  Claude Manges. Cleophas Dunker, M.D.   ANESTHESIA:  General orotracheal with supplemental interscalene nerve block.   COMPLICATIONS:  None.   HISTORY:  [name] gentleman who has had a previous left shoulder  arthroscopy in December 2004 and has had some continued discomfort, but he  has developed more and more pain in his right shoulder to the point of  compromise.  He has had an MRI scan of the right shoulder revealing a large  complete rotator cuff tear, specifically of the supraspinatus tendon.  There  is a type 2 acromion with hypertrophy of the [facility] joint and spurring on the  anterior and inferior margins of the clavicle, exposing him to impingement.  As he is having more and more difficulty with the right shoulder, he is to  have an arthroscopic evaluation and open rotator cuff tear repair.   PROCEDURE:  With the patient comfortable on the operating table and under  general orotracheal anesthesia with a supplemental interscalene nerve block,  the patient was placed in a semisitting  position with the shoulder frame.  The right shoulder was then prepped with Duraprep from the base of the neck  circumferentially below the elbow, and sterile draping was performed.   A marking pen was used to outline the acromion, the [facility] joint, and the  coracoid.  At a point a fingerbreadth posterior and medial to the posterior  angle of the acromion, a small stab wound was made.  The arthroscope was  then easily placed into the shoulder joint.  Diagnostic arthroscopy revealed  intact biceps tendon, humeral head, and the glenoid.  There were no loose  bodies.  There was some fraying of the anterior glenoid labrum without a  distinct tear.  This was then debrided with the Cuda shaver through a second  portal established anteriorly with the cannula.  There was an obvious  rotator cuff tear of the supraspinatus with retraction.  The edges were  rounded.  I could easily visualize the subacromial space through the  shoulder joint.  After debridement of the anterior glenoid labrum, the  arthroscope was then placed in the subacromial space posteriorly and the  cannula in the subacromial space anteriorly.  An  arthroscopic subacromial  decompression was performed with the Cuda shaver and the 6 mm bur.  There  was obvious overhang.  We had an excellent decompression.  There was obvious  degenerative change at the [facility] joint with inferior spurring and with  impingement on the cuff.  A distal clavicle resection was performed with the  6 mm bur, and we had a very nice resection.   The instruments were then removed.  A mini-open rotator cuff tear repair was  then performed.  About an inch incision was made along the anterolateral  aspect of the shoulder, beginning at the acromion, extending distally.  Via  sharp dissection, incision was carried down to the subcutaneous tissue,  gross bleeders were Bovie coagulated.  A self-retaining retractor was  inserted.  The fibers of the deltoid fascia were  identified, incised along  the lines of the fascia, and via blunt dissection the deltoid muscle was  separated.  Subacromial fluid was evacuated.  The arm was then externally  rotated.  The supraspinatus tendon was obviously torn in a V fashion with  rounded edges.  The biceps tendon was intact.  There was some spurring along  the humeral head.  These were removed so that I had nice rounded taper of  the humeral head and a good bleeding bone surface.  The edges of the rotator  cuff were sharply debrided and then repaired form the V starting at the V  portion, then posteriorly along to the anterior portion using 0 Ethibond  suture.  One single Mitek anchor was then inserted to secure the repair.  As  I placed the arm through a full range of motion, there was no impingement.  A nice repair without pressure on the supraspinatus or infraspinatus  tendons.   The joint was then copiously irrigated with saline solution.  The deltoid  fascia closed with a running 0 Vicryl, the subcu with a running 2-0 Vicryl,  skin closed with skin clips.  A sterile bulky dressing was applied, followed  by a sling.   PLAN:  Recovery care.  Discharge in the morning.  Percocet for pain.  Office  one week.      Cindee Lame   PWW/MEDQ  D:  11/04/2004  T:  11/04/2004  Job:  [id number]

## 2011-04-24 NOTE — Assessment & Plan Note (Signed)
[facility]                            CARDIOLOGY OFFICE NOTE   CLIDE, REMMERS                     MRN:          [id number]  DATE:02/09/2007                            DOB:          11-30-1932    REFERRING PHYSICIAN:  Alfredia Client, MD   REASON FOR PRESENTATION:  Patient with chest pain.   HISTORY OF PRESENT ILLNESS:  Patient was hospitalized on December 17th  with chest pain felt to be unstable angina.  However, he had a cardiac  catheterization, which demonstrated left main normal.  The LAD had no  obstructive disease.  There was 30% to 40% proximal stenosis and 30% mid  stenosis.  The circumflex had little irregularities with a 20% proximal  stenosis.  The right coronary artery had 30% proximal stenosis.  The EF  was 55% to 60%.  The patient returns for followup.  He has had some  fleeting chest discomfort.  He has also had some occasional aching under  his left breast.  He has had none of the chest discomfort that was  described on admission.  In particular, there has been no substernal  chest pressure, neck or arm discomfort.  He has no palpitations,  presyncope or syncope.  He denies any PND or orthopnea.   PAST MEDICAL HISTORY:  1. Hyperlipidemia.  2. Peptic ulcer disease.  3. Degenerative joint disease.  4. Osteoarthritis.  5. Low back pain.  6. Rotator cuff repair.  7. Laminectomy.  8. Carpal tunnel syndrome, status post surgery.   ALLERGIES:  NONE.   MEDICATIONS:  1. Aspirin 81 mg daily.  2. Gabapentin.  3. Vytorin 10/40 three times a week.  4. Protonix 40 mg daily.  5. Lisinopril 5 mg daily.  6. Celebrex 200 mg b.i.d.  7. Fish oil 1000 mg b.i.d.  8. Flax seed oil.  9. Super B complex.   REVIEW OF SYSTEMS:  As stated in the HPI, and otherwise negative for  other systems.   PHYSICAL EXAMINATION:  The patient is in no distress.  Blood pressure 108/64.  Heart rate 72 and regular.  Weight 191 pounds.  Body mass index  26.  HEENT:  Eyelids unremarkable.  Pupils equal, round and reactive to  light.  Fundi not visualized.  Oral mucosa unremarkable.  NECK:  No jugular venous distention.  Wave form within normal limits.  Carotid upstroke brisk and symmetric.  No bruits.  No thyromegaly.  LYMPHATICS:  No cervical, axillary or inguinal adenopathy.  LUNGS:  Clear to auscultation bilaterally.  BACK:  No costovertebral angle tenderness.  CHEST:  Unremarkable.  HEART:  PMI not displaced or sustained.  S1 and S2 within normal limits.  No S3.  No S4.  No clicks.  No rubs.  No murmurs.  ABDOMEN:  Flat.  Positive bowel sounds.  Normal in frequency and pitch.  No bruits. No rebound.  No guarding.  No midline pulsatile mass.  No  organomegaly.  SKIN:  No rashes.  No nodules.  EXTREMITIES:  Two plus pulses throughout.  No cyanosis.  No clubbing.  No edema.  NEUROLOGIC:  Oriented  to person, place and time.  Cranial nerves 2  through 12 are grossly intact.  Motor grossly intact.   EKG:  Sinus rhythm.  Rate 72.  Axis within normal limits.  Intervals  within normal limits.  No acute ST wave changes.   ASSESSMENT AND PLAN:  1. Chest discomfort.  The patient is no longer having the chest      discomfort that sounded like angina.  He has some atypical fleeting      discomfort.  No further cardiovascular testing is suggested at this      point.  2. Mitral insufficiency.  The patient reports a history of mitral      insufficiency and 2 other valve problems.  He does have a card that      suggests endocarditis prophylaxis.  I do not appreciate any      murmurs.  There was no mitral regurgitation on his CAT.  He is      going to get an echocardiogram to determine whether he does indeed      have any valve abnormalities.  It is unlikely that he will need      endocarditis prophylaxis based on new guidelines.  3. Followup.  He can come back to this clinic as needed.     Rollene Rotunda, MD, [facility]  Electronically  Signed    JH/MedQ  DD: 02/09/2007  DT: 02/09/2007  Job #: [id number]

## 2011-04-24 NOTE — H&P (Signed)
NAME:  Michael Mcclure, Michael Mcclure NO.:  [id number]   MEDICAL RECORD NO.:  [id number]          PATIENT TYPE:  INP   LOCATION:  NA                           FACILITY:  [facility]   PHYSICIAN:  Payton Doughty, M.D.      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   BODY OF TEXT:  A very nice 77 year old right-handed white gentleman who had  been seen for numerous years for median neuropathy after carpal tunnel  release by another doctor. He had been doing well. He has had some back pain  with occasional pain into his left hip. He actually had epidural steroids a  year ago and did well. However, he has had marked increase in his left back  and hip pain and MRI demonstrates a synovial cyst on the left side at L4-5  and he is admitted for laminectomy and resection of this disk.   PAST MEDICAL HISTORY:  Very benign.   MEDICATIONS:  He takes Protonix 40 mg daily, Neurontin 600 mg b.i.d.,  Ecotrin once a day, Celebrex b.i.d., and Vytorin.   PAST SURGICAL HISTORY:  Carpal tunnel surgery.   SOCIAL HISTORY:  He does not smoke and does not drink. 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   FAMILY HISTORY:  Mother is in fair health, age 3, with cancer and heart  disease. Father died of COPD and black lung at 29 years of age.   REVIEW OF SYSTEMS:  Remarkable for swelling of his hands and arthritis.   PHYSICAL EXAMINATION:  HEENT: Within normal limits.  NECK: He has good range of motion of his neck.  CHEST: Clear.  CARDIAC: Regular rate and rhythm.  ABDOMEN: Nontender with no hepatosplenomegaly.  EXTREMITIES: No clubbing, cyanosis, or edema.  GU: Deferred.  PERIPHERAL PULSES: Good.  NEUROLOGIC: He is awake, alert, and oriented. His cranial nerves are intact.  Motor exam shows 5/5 strength throughout the upper extremities. Strength of  the  adductor pollicus on the right was 4/5. His lower extremity strength is  full, but he has very positive straight leg raise on the left and can barely  put his left foot on the floor to walk. Reflexes are 1 at the right knee,  absent at the left, 1 at the ankles.   MRI results have been reviewed above.   CLINICAL IMPRESSION:  Left L4-5 radiculopathy related to synovial cyst.   PLAN:  Laminectomy for cyst resection. The risks and benefits of this  approach  have been discussed with him and he wishes to proceed.       MWR/MEDQ  D:  07/01/2005  T:  07/01/2005  Job:  [id number]

## 2011-04-24 NOTE — Op Note (Signed)
NAME:  DAMAREE, SARGENT              ACCOUNT NO.:  [id number]   MEDICAL RECORD NO.:  [id number]          PATIENT TYPE:  AMB   LOCATION:  DSC                          FACILITY:  [facility]   PHYSICIAN:  Claude Manges. Whitfield, M.D.DATE OF BIRTH:  March 06, 1932   DATE OF PROCEDURE:  11/17/2005  DATE OF DISCHARGE:                                 OPERATIVE REPORT   PREOPERATIVE DIAGNOSIS:  Recurrent rotator cuff tear, right shoulder.   POSTOPERATIVE DIAGNOSIS:  Recurrent rotator cuff tear, right shoulder.   OPERATION/PROCEDURE:  Repair of rotator cuff tear, right shoulder, with  supplemental Restore patch.   SURGEON:  Claude Manges. Cleophas Dunker, M.D.   ASSISTANT:  Legrand Pitts. Duffy, P.A.-C.   ANESTHESIA:  General oral tracheal and supplemental interscalene nerve  block.   COMPLICATIONS:  None.   HISTORY:  [name] gentleman is just over a year status post evaluation  in his right shoulder arthroscopically with a subacromial decompression,  distal clavicle resection and a mini open rotator cuff tear.  He did well  until this past summer when he was doing some work around the house until  something happened to my shoulder.  The pain slowly resolved and he was  able to raise his arm over his head but over the past or so has had  increasing pain.  He has had a repeat MRI scan revealing a recurrent  supraspinatus tendon tear with about 3-4 cm of retraction and atrophy and  some subacromial subdeltoid fluid.  He wishes to proceed with exploration.   DESCRIPTION OF PROCEDURE:  With the patient comfortable on the operating  table and under general orotracheal anesthesia with an excellent  interscalene nerve block, the patient was placed in semi-sitting position  with his shoulder frame.  The right shoulder was then prepped  circumferentially from the base of the neck to below the elbow with  DuraPrep.  Sterile draping was performed.  The previous longitudinal  incision at the junction of the anterolateral  aspect of the shoulder was  utilized and by sharp dissection carried into the subcutaneous tissue.  Gross bleeders were Bovie coagulated.  Deltoid fascia was identified and  incised on the length of the 1-1/2 inch skin incision.  Via blunt  dissection, the deltoid fibers were elevated from the subacromial space.  A  self-retaining retractor was inserted.  There was minimal subbursal and  subacromial fluid.  There was recurrent bursa formation.  This was carefully  excised with the Bovie.  The rotator cuff was identified and there was  obvious recurrent tear in a V fashion with the apex located anteriorly and  opened up at the humeral head.  The old suture was identified and removed.  There was some delamination of the cuff tear.  The edges were sharply  debrided.  Starting at the apex, the rotator cuff tear was repaired with 0  Ethibond suture.  A single Mitek anchor was inserted to stabilize the edge  of the humeral head after debriding bone.  Because of the recurrent nature  and because of the atrophy, I elected to use a DePuy Restore patch.  The  patch was reconstituted in saline for approximately 15 minutes.  It was  fashioned to correspond to the rectangular nature of the tear and with a  good border on either side, it was sutured in place under tension with 0  Ethibond suture and a very nice repair. There was no evidence of an  impingement.  The wound was then copiously irrigated with saline solution.  The deltoid fascia closed with a running 0 Vicryl, the subcutaneous tissue  with 2-0 Vicryl.  Skin closed with skin clips.  Sterile bulky dressing was  applied.  Marcaine was injected.  Sling was applied and the patient returned  to the post anesthesia recovery room in satisfactory condition.   He will spend the night in recovery care and discharged in the morning on  Percocet for pain.  We will check him in the office in a week to 10 days.      Claude Manges. Cleophas Dunker, M.D.   Electronically Signed     PWW/MEDQ  D:  11/17/2005  T:  11/18/2005  Job:  [id number]

## 2011-04-24 NOTE — Discharge Summary (Signed)
NAME:  Michael Mcclure, Michael Mcclure              ACCOUNT NO.:  [id number]   MEDICAL RECORD NO.:  [id number]          PATIENT TYPE:  OBV   LOCATION:  2034                         FACILITY:  [facility]   PHYSICIAN:  Arturo Morton. Riley Kill, MD, FACCDATE OF BIRTH:  [birth year]   DATE OF ADMISSION:  11/15/2006  DATE OF DISCHARGE:  11/16/2006                               DISCHARGE SUMMARY   PROCEDURES:  None.   </DISCHARGE DIAGNOSIS>   Chest pain.   SECONDARY DIAGNOSIS:  1. Degenerative joint disease.  2. Hyperlipidemia with a total cholesterol of 179, triglycerides 151,      HDL 46, LDL 103 this admission.  3. History of a pinched nerve.  4. Remote history of chest pain with a cardiac catheterization 15      years ago.  5. More recent history of chest pain with a stress echo in June 2006.  6. Status post rotator cuff repair of carpal tunnel surgery,      laminectomy and left thumb surgery.  7. Status post cystogram at [facility]  8. Family history of coronary artery disease in his father.   TIME AT DISCHARGE:  Thirty-three minutes.   HOSPITAL COURSE:  Michael Mcclure is a 77 year old male with no previous  history of coronary artery disease.  He went to see Dr. Montey Hora  because of episodes of left-sided chest pain and shortness of breath.  The shortness of breath was brief, lasting only a few seconds.  Dr. Dimple Casey  treated him for this with an unknown medication which he says did not  help very much.  He also complains of some left-sided grabbing chest  pain that also lasted only a brief period of time.  The symptoms occur  at rest and with exertion.  They are not related to food intake.  He had  some of these episodes and Dr. Dimple Casey felt he needed further evaluation.  He was admitted overnight.   His cardiac enzymes were normal.  He had a chest x-ray which showed no  acute disease.  He was continued on his home medications including a  proton pump inhibitor.   On November 16, 2006, Michael Mcclure was ambulating without chest pain or  shortness of breath.  His oxygen saturation was 99% on room air.  His  repeat EKG is pending but if this is unchanged he is tentatively  considered stable for discharge on November 16, 2006 with outpatient  stress testing arranged.   DISCHARGE INSTRUCTIONS:  1. His activity level is to be increased gradually.  2. He is to stick to a low-fat diet.  3. He is to follow up with a stress test at the [facility] office on      November 17, 2006 at 1:30 p.m.  4. He is to follow up with Dr. Antony Blackbird and with Dr. Dimple Casey and with Dr.      Riley Kill on a p.r.n. basis.   DISCHARGE MEDICATIONS:  1. Coated aspirin 81 mg daily  2. Gabapentin 600 mg b.i.d.  3. Vytorin 10/40 daily.  4. Clarinex daily.  5. Protonix 40 mg a day.  6. Lisinopril 5 mg a day.  7. Sulindac 200 mg daily.  8. Fish oil as prior to admission.  9. Nitroglycerin sublingual p.r.n.      Theodore Demark, PA-C      Arturo Morton. Riley Kill, MD, [facility]  Electronically Signed    RB/MEDQ  D:  11/16/2006  T:  11/16/2006  Job:  [id number]   cc:   Everette Rank, MD  Magnus Sinning. Rice, M.D.

## 2011-04-24 NOTE — H&P (Signed)
NAME:  Michael, Mcclure              ACCOUNT NO.:  [id number]   MEDICAL RECORD NO.:  [id number]          PATIENT TYPE:  INP   LOCATION:  3729                         FACILITY:  [facility]   PHYSICIAN:  Madolyn Frieze. Jens Som, MD, FACCDATE OF BIRTH:  1932-01-09   DATE OF ADMISSION:  11/22/2006  DATE OF DISCHARGE:                              HISTORY & PHYSICAL   PRIMARY CARDIOLOGIST:  Dr. Shawnie Pons   CARDIOLOGIST IN [place]:  Michael Dr. Antony Blackbird, phone number [phone]   PRIMARY CARE PHYSICIAN LOCALLY:  Dr. Montey Hora in [place]   PATIENT PROFILE:  Michael Mcclure with no prior history of CAD  who presents with exertional angina.   PROBLEM LIST:  1. Unstable angina.      Michael.     November 17, 2006, exercise Myoview:  Exercised x5 minutes,       maximum heart rate 131 beats per minute, EF was 70%, inferior       reversible ischemia was noted.      b.     Cardiac catheterization in [place] approximately 15       years ago reportedly normal per patient.      c.     Stress echocardiogram in June of 2007 in [place]       reportedly normal per patient.  2. Hyperlipidemia.  3. Peptic ulcer disease.  4. DJD.  5. Osteoarthritis.  6. Low back pain secondary to pinched nerve.  7. Status post right rotator cuff repair.  8. Status post laminectomy in July of 2006.  9. Carpal tunnel syndrome status post surgery.   HISTORY OF PRESENT ILLNESS:  Michael Mcclure with no prior  history of CAD.  He was recently admitted December 10 to December 11  with shortness of breath and atypical chest pain, he ruled out and was  discharged home.  He followed up in our office with an exercise Myoview  on December 12 and walked for five minutes with nuclear imaging  revealing reversible inferior ischemia with an EF of 70%.  He was  supposed to follow up with Dr. Riley Kill this afternoon.  This morning  after waking up, he developed 2-to-3/10 left chest aching without  associated  symptoms lasting less than 15 minutes and resolved  spontaneously.  Later in the morning, he had recurrent exertional chest  pain while walking in [place] and again early this afternoon while  walking around his neighborhood.  It was worse while walking around his  neighborhood, described as Michael 5/10 left chest tightness and pressure with  radiation to the jaw and sensation that his tongue was thick.  This was  associated with shortness of breath.  He took one sublingual  nitroglycerin prior to returning home without relief and then once  returning home, he laid down and took Michael second sublingual nitroglycerin  with relief of symptoms approximately 30 minutes after that.  At that  point, he called 911 and upon EMS arrival he was more or less pain free.  He was taken here to [facility] ED where he complains of Michael  1/10 left  chest ache, but otherwise is comfortable.  He denies any PND, orthopnea,  dizziness, syncope, edema or early satiety.   ALLERGIES:  No known drug allergies.   HOME MEDICATIONS:  1. Aspirin 81 mg q.d.  2. Gabapentin 600 mg b.i.d.  3. Vytorin 10/40 mg q.d.  4. Clarinex q.d.  5. Protonix 20 mg q.d.  6. Lisinopril 5 mg q.d.  7. Sulindac 200 mg q.d.  8. Fish oil q.d.  9. Nitroglycerin p.r.n.   FAMILY HISTORY:  Mother died of cancer at age 93, father died of  complications of CHF at age 74.  His father had his first MI in his  22's.   SOCIAL HISTORY:  Lives in [name] with his son, daughter-in-law and two  grandchildren approximately nine months out of the year and at his home  in [place] two to three months out of the year.  He is Michael retired  Forensic scientist.  He is separated from his wife.  He denies any tobacco,  alcohol or drug use.  He walks 1 to 1 1/2 miles on Michael fairly regular  basis and just walked 2 miles on Saturday.   REVIEW OF SYSTEMS:  Positive for chest pain, shortness of breath as  described in the HPI.  All other systems reviewed and are negative.    PHYSICAL EXAMINATION:  VITAL SIGNS:  Temperature 97.0, heart rate 70,  respirations 20, blood pressure 107/57, pulse ox 99% on two liters.  GENERAL:  Pleasant white Mcclure in no acute distress, awake, alert and  oriented x3.  HEENT:  Atraumatic, normocephalic.  NEURO:  Grossly intact, nonfocal.  SKIN:  Warm and dry with discoloration and slight excoriation noted of  the right earlobe.  Patient says this has been evaluated by Dr. Dimple Casey and  he is pending outpatient consult.  NECK:  No bruits or JVD.  LUNGS:  Respirations regular, unlabored, clear to auscultation.  CARDIAC:  Regular S1-S2, no S3-S4 murmurs.  ABDOMEN:  Round, soft, nontender, nondistended, bowel sounds present x4.  EXTREMITIES:  Warm, dry and pink, no clubbing, cyanosis or edema.  Dorsalis pedis, posterior tibial pulses 2+ and equal bilaterally.  No  femoral bruits noted.   Chest x-ray is pending.   EKG shows sinus rhythm with Michael rate of 70 and no acute ST-T changes.   Lab work is pending.   ASSESSMENT/PLAN:  1. Unstable angina:  His exertional symptoms this morning and      afternoon concerning for angina.  He had an abnormal Myoview on      December 12 with inferior reversible defect and normal LV function.      Plan to admit, cycle enzymes and add heparin and NitroPaste.      Continue aspirin, statin and ACE inhibitor.  Will add low-dose beta-      blocker as blood pressure tolerates. Catheterization in the Michael.m.  2. Hyperlipidemia:  LDL 103 earlier this month.  Continue Vytorin.  3. Hypertension:  Stable on ACE inhibitor.  4. Peptic ulcer disease:  Continue proton pump inhibitor.  5. Skin lesion of the right earlobe:  This will be followed by      dermatology as an outpatient as planned by Dr. Montey Hora.      Nicolasa Ducking, ANP      Madolyn Frieze. Jens Som, MD, [facility]  Electronically Signed    CB/MEDQ  D:  11/22/2006  T:  11/23/2006  Job:  [id number]

## 2012-02-24 ENCOUNTER — Ambulatory Visit: Payer: Medicare Other | Attending: Physician Assistant | Admitting: Physical Therapy

## 2012-02-24 DIAGNOSIS — R5381 Other malaise: Secondary | ICD-10-CM | POA: Insufficient documentation

## 2012-02-24 DIAGNOSIS — M2560 Stiffness of unspecified joint, not elsewhere classified: Secondary | ICD-10-CM | POA: Insufficient documentation

## 2012-02-24 DIAGNOSIS — IMO0001 Reserved for inherently not codable concepts without codable children: Secondary | ICD-10-CM | POA: Insufficient documentation

## 2012-02-29 ENCOUNTER — Ambulatory Visit: Payer: Medicare Other | Admitting: Physical Therapy

## 2012-03-02 ENCOUNTER — Ambulatory Visit: Payer: Medicare Other | Admitting: Physical Therapy

## 2012-03-07 ENCOUNTER — Ambulatory Visit: Payer: Medicare Other | Attending: Physician Assistant | Admitting: Physical Therapy

## 2012-03-07 DIAGNOSIS — R5381 Other malaise: Secondary | ICD-10-CM | POA: Insufficient documentation

## 2012-03-07 DIAGNOSIS — M2560 Stiffness of unspecified joint, not elsewhere classified: Secondary | ICD-10-CM | POA: Insufficient documentation

## 2012-03-07 DIAGNOSIS — IMO0001 Reserved for inherently not codable concepts without codable children: Secondary | ICD-10-CM | POA: Insufficient documentation

## 2012-03-09 ENCOUNTER — Ambulatory Visit: Payer: Medicare Other | Admitting: Physical Therapy

## 2012-03-11 ENCOUNTER — Ambulatory Visit: Payer: Medicare Other | Admitting: Physical Therapy

## 2012-03-14 ENCOUNTER — Ambulatory Visit: Payer: Medicare Other | Admitting: Physical Therapy

## 2012-03-16 ENCOUNTER — Encounter: Payer: Self-pay | Admitting: Internal Medicine

## 2012-03-16 ENCOUNTER — Ambulatory Visit: Payer: Medicare Other | Admitting: Physical Therapy

## 2012-03-18 ENCOUNTER — Ambulatory Visit: Payer: Medicare Other | Admitting: Physical Therapy

## 2012-03-21 ENCOUNTER — Ambulatory Visit: Payer: Medicare Other | Admitting: Physical Therapy

## 2012-03-23 ENCOUNTER — Ambulatory Visit: Payer: Medicare Other | Admitting: Physical Therapy

## 2012-03-25 ENCOUNTER — Ambulatory Visit: Payer: Medicare Other | Admitting: *Deleted

## 2012-03-28 ENCOUNTER — Ambulatory Visit: Payer: Medicare Other | Admitting: Physical Therapy

## 2012-03-30 ENCOUNTER — Ambulatory Visit: Payer: Medicare Other | Admitting: Physical Therapy

## 2012-04-01 ENCOUNTER — Encounter: Payer: PRIVATE HEALTH INSURANCE | Admitting: Physical Therapy

## 2012-04-04 ENCOUNTER — Encounter: Payer: Self-pay | Admitting: Internal Medicine

## 2012-04-05 ENCOUNTER — Ambulatory Visit (INDEPENDENT_AMBULATORY_CARE_PROVIDER_SITE_OTHER): Payer: Medicare Other | Admitting: Internal Medicine

## 2012-04-05 ENCOUNTER — Encounter: Payer: Self-pay | Admitting: Internal Medicine

## 2012-04-05 VITALS — BP 110/60 | HR 80 | Ht 71.0 in | Wt 187.0 lb

## 2012-04-05 DIAGNOSIS — K644 Residual hemorrhoidal skin tags: Secondary | ICD-10-CM

## 2012-04-05 DIAGNOSIS — Z86010 Personal history of colonic polyps: Secondary | ICD-10-CM

## 2012-04-05 DIAGNOSIS — K219 Gastro-esophageal reflux disease without esophagitis: Secondary | ICD-10-CM

## 2012-04-05 MED ORDER — PEG-KCL-NACL-NASULF-NA ASC-C 100 G PO SOLR: 1.0000 | Freq: Once | ORAL | Status: DC

## 2012-04-05 MED ORDER — HYDROCORTISONE ACE-PRAMOXINE 2.5-1 % RE CREA: TOPICAL_CREAM | Freq: Three times a day (TID) | RECTAL | Status: DC

## 2012-04-05 NOTE — Patient Instructions (Signed)
You have been scheduled for a colonoscopy/Endoscopy with propofol. Please follow written instructions given to you at your visit today.  Please pick up your prep kit at the pharmacy within the next 1-3 days.  We have sent the following medications to your pharmacy for you to pick up at your convenience: [email or web address]

## 2012-04-05 NOTE — Progress Notes (Addendum)
Subjective:    Patient ID: Michael Mcclure, male    DOB: [birth year], 78 y.o.   MRN: [id number]  HPI Michael Mcclure is a 78 yo male with PMH of adenomatous colon polyps with last colonoscopy in 2011, recommended repeat interval 2 years, GERD, back pain, hyperlipidemia, BPH who seen in consultation at the request of Dr. Christell Constant for evaluation of surveillance colonoscopy. The patient reports that overall he is doing rare well. He's had some issues with hemorrhoids over the last several weeks. He reports occasional constipation and the need to strain. He does occasionally see red blood with wiping. He has some associated perianal itching and burning. He has been using over-the-counter Preparation H with some response, though this has been in complete. He's had no diarrhea. No melena. No abdominal pain. Appetite is good and weight is stable. He does have a long-standing history of gastroesophageal reflux disease, and he is currently taking pantoprazole 40 mg daily. He has good response and good GERD controlled with Protonix. No fevers or chills.  Review of Systems As per history of present illness, otherwise neg  Patient Active Problem List  Diagnoses  . ARTHRITIS  . BACK PAIN  . ABDOMINAL PAIN, CHRONIC  . CARPAL TUNNEL SYNDROME, HX OF  . GERD (gastroesophageal reflux disease)  . Hyperlipidemia  . Neurogenic bladder  . BPH (benign prostatic hypertrophy)   Past Surgical History  Procedure Date  . Back surgery   . Wrist surgery   . Shoulder surgery     x 2    Current Outpatient Prescriptions  Medication Sig Dispense Refill  . aspirin 81 MG tablet Take 81 mg by mouth daily.      . celecoxib (CELEBREX) 100 MG capsule Take 200 mg by mouth daily.       . clopidogrel (PLAVIX) 75 MG tablet Take 75 mg by mouth daily.      Marland Kitchen ezetimibe-simvastatin (VYTORIN) 10-40 MG per tablet Take 1 tablet by mouth at bedtime.      . gabapentin (NEURONTIN) 600 MG tablet Take 600 mg by mouth 2 (two) times daily.         Marland Kitchen levothyroxine (SYNTHROID, LEVOTHROID) 25 MCG tablet Take 25 mcg by mouth daily.        . pantoprazole (PROTONIX) 40 MG tablet Take 40 mg by mouth daily.        . hydrocortisone-pramoxine (ANALPRAM-HC) 2.5-1 % rectal cream Place rectally 3 (three) times daily.  30 g  0  . peg 3350 powder (MOVIPREP) 100 G SOLR Take 1 kit (100 g total) by mouth once.  1 kit  0   No Known Allergies  Family History  Problem Relation Age of Onset  . Colon cancer Mother   . Colon polyps Mother     ?  . Diabetes Daughter   . Heart disease Father    History  Substance Use Topics  . Smoking status: Never Smoker   . Smokeless tobacco: Never Used  . Alcohol Use: No     Objective:   Physical Exam BP 110/60  Pulse 80  Ht 5\' 11"  (1.803 m)  Wt 187 lb (84.823 kg)  BMI 26.08 kg/m2 Constitutional: Well-developed and well-nourished. No distress. HEENT: Normocephalic and atraumatic. Oropharynx is clear and moist. No oropharyngeal exudate. Conjunctivae are normal. Pupils are equal round and reactive to light. No scleral icterus. Neck: Neck supple. Trachea midline. Cardiovascular: Normal rate, regular rhythm and intact distal pulses. No M/R/G Pulmonary/chest: Effort normal and breath sounds normal. No wheezing,  rales or rhonchi. Abdominal: Soft, nontender, nondistended. Bowel sounds active throughout. There are no masses palpable. No hepatosplenomegaly. Extremities: no clubbing, cyanosis, or edema Lymphadenopathy: No cervical adenopathy noted. Neurological: Alert and oriented to person place and time. Skin: Skin is warm and dry. No rashes noted. Psychiatric: Normal mood and affect. Behavior is normal.     Assessment & Plan:  Michael Mcclure is a 78 yo male with PMH of adenomatous colon polyps with last colonoscopy in 2011, recommended repeat interval 2 years, GERD, back pain, hyperlipidemia, BPH who seen in consultation at the request of Dr. Christell Constant for evaluation of surveillance colonoscopy.   1. Hx of  adenomatous colon polyps -- I do not have a copy of the actual colonoscopy report or pathology, but I do have a letter from his gastroenterologist and [place] stating the recommendation to repeat the test around June of 2013. Given his history of prior colon polyps, and the recommendation of his prior GI M.D., we will proceed with colonoscopy. We discussed the risks and benefits and he is agreeable to proceed.  2. GERD -- patient has long-standing gastroesophageal reflux disease. He reports a very remote upper endoscopy. EGD will be performed at the same time as his screening colonoscopy given his long-standing history of GERD and rule out Barrett's esophagus. He is worried about his long-standing reflux disease, and is agreeable to the upper endoscopy.  3. External hemorrhoids -- the patient's symptoms are consistent with external hemorrhoids. I will prescribe Analpram twice a day for one week. We can better evaluate these at colonoscopy.  Addendum: Colonoscopy report received from [facility] --Date 06/27/2010, M.D. Dr. Hilda Lias, MD Exam to the cecum with suboptimal prep. 2 diminutive polyps in the descending and sigmoid. Internal hemorrhoids. Pathology = hyperplastic polyps  --Given the suboptimal prep we'll proceed with colonoscopy as discussed in clinic.

## 2012-04-19 ENCOUNTER — Other Ambulatory Visit: Payer: Self-pay | Admitting: Internal Medicine

## 2012-04-19 ENCOUNTER — Other Ambulatory Visit: Payer: Self-pay | Admitting: Gastroenterology

## 2012-04-19 DIAGNOSIS — K219 Gastro-esophageal reflux disease without esophagitis: Secondary | ICD-10-CM

## 2012-04-19 DIAGNOSIS — Z86010 Personal history of colonic polyps: Secondary | ICD-10-CM

## 2012-04-19 MED ORDER — HYDROCORTISONE ACE-PRAMOXINE 2.5-1 % RE CREA: TOPICAL_CREAM | Freq: Three times a day (TID) | RECTAL | Status: DC

## 2012-04-25 ENCOUNTER — Other Ambulatory Visit: Payer: Self-pay | Admitting: Gastroenterology

## 2012-04-25 ENCOUNTER — Telehealth: Payer: Self-pay | Admitting: Internal Medicine

## 2012-04-25 ENCOUNTER — Telehealth: Payer: Self-pay | Admitting: Gastroenterology

## 2012-04-25 NOTE — Telephone Encounter (Signed)
completed

## 2012-04-25 NOTE — Telephone Encounter (Signed)
Called Dr. Kathi Der office regarding Pt's plavix. Dr. Christell Constant said for the pt to stay on Plavix until Wednesday 04/27/2012 do not take wednesdays or Thursdays dose.  I called the pt and left a voicemail stating his plavix schedule and to call us back to make sure he understood.

## 2012-04-25 NOTE — Telephone Encounter (Signed)
Sent letter to Dr. Christell Constant asking about holding pt's Plavix.  Pt stated he has been holding his Plavix since Satruday.

## 2012-04-25 NOTE — Telephone Encounter (Signed)
Sent anticoagulation letter to Dr. Christell Constant.

## 2012-04-28 ENCOUNTER — Ambulatory Visit (AMBULATORY_SURGERY_CENTER): Payer: Medicare Other | Admitting: Internal Medicine

## 2012-04-28 ENCOUNTER — Encounter: Payer: Self-pay | Admitting: Internal Medicine

## 2012-04-28 VITALS — BP 147/76 | HR 66 | Temp 97.7°F | Resp 24 | Ht 71.0 in | Wt 187.0 lb

## 2012-04-28 DIAGNOSIS — Z1211 Encounter for screening for malignant neoplasm of colon: Secondary | ICD-10-CM

## 2012-04-28 DIAGNOSIS — Z860100 Personal history of colon polyps, unspecified: Secondary | ICD-10-CM

## 2012-04-28 DIAGNOSIS — K219 Gastro-esophageal reflux disease without esophagitis: Secondary | ICD-10-CM

## 2012-04-28 MED ORDER — SODIUM CHLORIDE 0.9 % IV SOLN: 500.0000 mL | INTRAVENOUS | Status: DC

## 2012-04-28 NOTE — Op Note (Signed)
[facility] [address] [place], [place]  [address]  ENDOSCOPY PROCEDURE REPORT  PATIENT:  Michael, Mcclure  MR#:  [id number] BIRTHDATE:  October 30, 1932, 79 yrs. old  GENDER:  male ENDOSCOPIST:  Carie Caddy. Zuriel Yeaman, MD Referred by:  Wardell Heath, N.P. PROCEDURE DATE:  04/28/2012 PROCEDURE:  EGD, diagnostic 43235 ASA CLASS:  Class III INDICATIONS:  GERD MEDICATIONS:   MAC sedation, administered by CRNA, propofol (Diprivan) 100 mg IV TOPICAL ANESTHETIC:  Cetacaine Spray  DESCRIPTION OF PROCEDURE:   After the risks benefits and alternatives of the procedure were thoroughly explained, informed consent was obtained.  The LB [id number]-H180 [id number] endoscope was introduced through the mouth and advanced to the second portion of the duodenum, without limitations.  The instrument was slowly withdrawn as the mucosa was fully examined. <<PROCEDUREIMAGES>>  The esophagus and gastroesophageal junction were completely normal in appearance.  A small hiatal hernia was found.  A single erosion was found in the fundus without bleeding.  A < 5 mm sessile polyp, likely fundic gland in nature, was found in the proximal body of the stomach.  Normal distal stomach.  The duodenal bulb was normal in appearance, as was the postbulbar duodenum.    Retroflexed views revealed findings as previously described.    The scope was then withdrawn from the patient and the procedure completed.  COMPLICATIONS:  None  ENDOSCOPIC IMPRESSION: 1) Normal esophagus 2) Small hiatal hernia 3) Small erosion in the fundus 4) Small sessile polyp in the proximal body of the stomach, likely fundic gland (benign-appearing) 5) Normal examined duodenum  RECOMMENDATIONS: 1) Continue current medications  Tobi Leinweber M. Rhea Belton, MD  CC:  The Patient Michael Pierini, NP  n. Rosalie DoctorCarie Caddy. Josanne Boerema at 04/28/2012 01:58 PM  Lynnell Grain, [phone]

## 2012-04-28 NOTE — Progress Notes (Signed)
Patient did not experience any of the following events: a burn prior to discharge; a fall within the facility; wrong site/side/patient/procedure/implant event; or a hospital transfer or hospital admission upon discharge from the facility. (G8907) Patient did not have preoperative order for IV antibiotic SSI prophylaxis. (G8918)  

## 2012-04-28 NOTE — Patient Instructions (Signed)
Discharge instructions given with verbal understanding. Handouts on diverticulosis,hemorrhoids and a hiatal hernia given. Resume previous medications.YOU HAD AN ENDOSCOPIC PROCEDURE TODAY AT THE [facility]: Refer to the procedure report that was given to you for any specific questions about what was found during the examination.  If the procedure report does not answer your questions, please call your gastroenterologist to clarify.  If you requested that your care partner not be given the details of your procedure findings, then the procedure report has been included in a sealed envelope for you to review at your convenience later.  YOU SHOULD EXPECT: Some feelings of bloating in the abdomen. Passage of more gas than usual.  Walking can help get rid of the air that was put into your GI tract during the procedure and reduce the bloating. If you had a lower endoscopy (such as a colonoscopy or flexible sigmoidoscopy) you may notice spotting of blood in your stool or on the toilet paper. If you underwent a bowel prep for your procedure, then you may not have a normal bowel movement for a few days.  DIET: Your first meal following the procedure should be a light meal and then it is ok to progress to your normal diet.  A half-sandwich or bowl of soup is an example of a good first meal.  Heavy or fried foods are harder to digest and may make you feel nauseous or bloated.  Likewise meals heavy in dairy and vegetables can cause extra gas to form and this can also increase the bloating.  Drink plenty of fluids but you should avoid alcoholic beverages for 24 hours.  ACTIVITY: Your care partner should take you home directly after the procedure.  You should plan to take it easy, moving slowly for the rest of the day.  You can resume normal activity the day after the procedure however you should NOT DRIVE or use heavy machinery for 24 hours (because of the sedation medicines used during the test).     SYMPTOMS TO REPORT IMMEDIATELY: A gastroenterologist can be reached at any hour.  During normal business hours, 8:30 AM to 5:00 PM Monday through Friday, call [phone].  After hours and on weekends, please call the GI answering service at [phone] who will take a message and have the physician on call contact you.   Following lower endoscopy (colonoscopy or flexible sigmoidoscopy):  Excessive amounts of blood in the stool  Significant tenderness or worsening of abdominal pains  Swelling of the abdomen that is new, acute  Fever of 100F or higher  Following upper endoscopy (EGD)  Vomiting of blood or coffee ground material  New chest pain or pain under the shoulder blades  Painful or persistently difficult swallowing  New shortness of breath  Fever of 100F or higher  Black, tarry-looking stools  FOLLOW UP: If any biopsies were taken you will be contacted by phone or by letter within the next 1-3 weeks.  Call your gastroenterologist if you have not heard about the biopsies in 3 weeks.  Our staff will call the home number listed on your records the next business day following your procedure to check on you and address any questions or concerns that you may have at that time regarding the information given to you following your procedure. This is a courtesy call and so if there is no answer at the home number and we have not heard from you through the emergency physician on call, we will assume that  you have returned to your regular daily activities without incident.  SIGNATURES/CONFIDENTIALITY: You and/or your care partner have signed paperwork which will be entered into your electronic medical record.  These signatures attest to the fact that that the information above on your After Visit Summary has been reviewed and is understood.  Full responsibility of the confidentiality of this discharge information lies with you and/or your care-partner.

## 2012-04-28 NOTE — Op Note (Signed)
[facility] [address] [place], [place]  [address]  COLONOSCOPY PROCEDURE REPORT  PATIENT:  Michael Mcclure, Michael Mcclure  MR#:  [id number] BIRTHDATE:  Oct 10, 1932, 79 yrs. old  GENDER:  male ENDOSCOPIST:  Carie Caddy. Abrar Bilton, MD REF. BY:  Wardell Heath, N.P. PROCEDURE DATE:  04/28/2012 PROCEDURE:  Colonoscopy 02725 ASA CLASS:  Class III INDICATIONS:  surveillance and high-risk screening, history of adenomatous polyps, last colonoscopy 2011 with sub-optimal prep MEDICATIONS:   MAC sedation, administered by CRNA, propofol (Diprivan) 60 mg IV  DESCRIPTION OF PROCEDURE:   After the risks benefits and alternatives of the procedure were thoroughly explained, informed consent was obtained.  Digital rectal exam was performed and revealed no rectal masses.   The LB [id number] endoscope was introduced through the anus and advanced to the terminal ileum which was intubated for a short distance, without limitations. The quality of the prep was Suprep good.  The instrument was then slowly withdrawn as the colon was fully examined. <<PROCEDUREIMAGES>>  FINDINGS:  The terminal ileum appeared normal.  Mild diverticulosis was found in the sigmoid colon.  This was otherwise a normal examination of the colon.   Retroflexed views in the rectum revealed internal hemorrhoids.  The scope was then withdrawn from the cecum and the procedure completed.  COMPLICATIONS:  None  ENDOSCOPIC IMPRESSION: 1) Normal terminal ileum 2) Mild diverticulosis in the sigmoid colon 3) Otherwise normal examination 4) Internal hemorrhoids  RECOMMENDATIONS: 1) High fiber diet. 2) Given your age, you will not need another colonoscopy for colon cancer screening or polyp surveillance. These types of tests usually stop around the age 41.  Carie Caddy. Rhea Belton, MD  CC:  The Patient Bennie Pierini, NP  n. Rosalie DoctorCarie Caddy. Mandi Mattioli at 04/28/2012 02:06 PM  Lynnell Grain, [phone]

## 2012-04-29 ENCOUNTER — Telehealth: Payer: Self-pay | Admitting: *Deleted

## 2012-04-29 NOTE — Telephone Encounter (Signed)
  Follow up Call-  Call back number 04/28/2012  Post procedure Call Back phone  # [phone]  Permission to leave phone message Yes     Patient questions:  Do you have a fever, pain , or abdominal swelling? no Pain Score  0 *  Have you tolerated food without any problems? yes  Have you been able to return to your normal activities? yes  Do you have any questions about your discharge instructions: Diet   no Medications  no Follow up visit  no  Do you have questions or concerns about your Care? no  Actions: * If pain score is 4 or above: No action needed, pain <4.

## 2012-06-06 ENCOUNTER — Other Ambulatory Visit: Payer: Self-pay | Admitting: Internal Medicine

## 2012-12-29 NOTE — Telephone Encounter (Deleted)
Disregard opened on accident

## 2013-03-09 ENCOUNTER — Telehealth: Payer: Self-pay | Admitting: Physician Assistant

## 2013-03-09 NOTE — Telephone Encounter (Signed)
Pt scheduled with [facility] for med refills.

## 2013-03-10 ENCOUNTER — Telehealth: Payer: Self-pay | Admitting: Physician Assistant

## 2013-03-10 ENCOUNTER — Other Ambulatory Visit: Payer: Self-pay | Admitting: Physician Assistant

## 2013-03-10 ENCOUNTER — Encounter: Payer: Self-pay | Admitting: Physician Assistant

## 2013-03-10 ENCOUNTER — Ambulatory Visit (INDEPENDENT_AMBULATORY_CARE_PROVIDER_SITE_OTHER): Payer: Medicare Other | Admitting: Physician Assistant

## 2013-03-10 VITALS — BP 146/69 | HR 76 | Temp 97.2°F | Ht 71.0 in | Wt 191.4 lb

## 2013-03-10 DIAGNOSIS — K219 Gastro-esophageal reflux disease without esophagitis: Secondary | ICD-10-CM

## 2013-03-10 DIAGNOSIS — E785 Hyperlipidemia, unspecified: Secondary | ICD-10-CM

## 2013-03-10 DIAGNOSIS — E039 Hypothyroidism, unspecified: Secondary | ICD-10-CM

## 2013-03-10 DIAGNOSIS — I10 Essential (primary) hypertension: Secondary | ICD-10-CM

## 2013-03-10 DIAGNOSIS — N400 Enlarged prostate without lower urinary tract symptoms: Secondary | ICD-10-CM

## 2013-03-10 DIAGNOSIS — N184 Chronic kidney disease, stage 4 (severe): Secondary | ICD-10-CM

## 2013-03-10 MED ORDER — CLOPIDOGREL BISULFATE 75 MG PO TABS: 75.0000 mg | ORAL_TABLET | Freq: Every day | ORAL | Status: DC

## 2013-03-10 MED ORDER — PANTOPRAZOLE SODIUM 40 MG PO TBEC: 40.0000 mg | DELAYED_RELEASE_TABLET | Freq: Every day | ORAL | Status: DC

## 2013-03-10 MED ORDER — EZETIMIBE-SIMVASTATIN 10-40 MG PO TABS: 1.0000 | ORAL_TABLET | Freq: Every day | ORAL | Status: DC

## 2013-03-10 MED ORDER — CELECOXIB 100 MG PO CAPS: 200.0000 mg | ORAL_CAPSULE | Freq: Every day | ORAL | Status: DC

## 2013-03-10 MED ORDER — LEVOTHYROXINE SODIUM 25 MCG PO TABS: 25.0000 ug | ORAL_TABLET | Freq: Every day | ORAL | Status: DC

## 2013-03-10 MED ORDER — GABAPENTIN 600 MG PO TABS: 600.0000 mg | ORAL_TABLET | Freq: Two times a day (BID) | ORAL | Status: DC

## 2013-03-10 NOTE — Progress Notes (Signed)
  Subjective:    Patient ID: Michael Mcclure, male    DOB: [birth year], 79 y.o.   MRN: [id number]  HPI Recheck of chronic health issues; meds to be refilled   Review of Systems  HENT: Positive for ear pain, neck stiffness and tinnitus.   Genitourinary: Positive for decreased urine volume and difficulty urinating.  All other systems reviewed and are negative.       Objective:   Physical Exam  Vitals reviewed. Constitutional: He is oriented to person, place, and time. He appears well-developed and well-nourished.  HENT:  Head: Normocephalic and atraumatic.  Right Ear: External ear normal.  Left Ear: External ear normal.  Nose: Nose normal.  Mouth/Throat: Oropharynx is clear and moist.  Dentures Left TM retracted; decreased hearing left ear  Eyes: Conjunctivae and EOM are normal.  Neck: Normal range of motion. Neck supple. No tracheal deviation present. No thyromegaly present.  No carotid bruits noted  Cardiovascular: Normal rate, regular rhythm, normal heart sounds and intact distal pulses.   No murmur heard. Pulmonary/Chest:  Decreased breath sounds (never smoked)  Abdominal: Soft. Bowel sounds are normal.  Musculoskeletal: He exhibits tenderness. He exhibits no edema.  Right trapezial muscle tightness, decreased right rotation c-spine  Neurological: He is alert and oriented to person, place, and time.  Skin: Skin is warm and dry.  Psychiatric: He has a normal mood and affect. His behavior is normal. Judgment and thought content normal.          Assessment & Plan:  Other and unspecified hyperlipidemia  HTN (hypertension)  Chronic kidney disease (CKD), stage IV (severe)  GERD (gastroesophageal reflux disease)  BPH (benign prostatic hyperplasia)  Unspecified hypothyroidism  Meds ordered this encounter  Medications  . celecoxib (CELEBREX) 100 MG capsule    Sig: Take 2 capsules (200 mg total) by mouth daily.    Dispense:  30 capsule    Refill:  11    Order  Specific Question:  Supervising Provider    Answer:  Ernestina Penna [[id number]]  . clopidogrel (PLAVIX) 75 MG tablet    Sig: Take 1 tablet (75 mg total) by mouth daily.    Dispense:  30 tablet    Refill:  11    Order Specific Question:  Supervising Provider    Answer:  Ernestina Penna [[id number]]  . ezetimibe-simvastatin (VYTORIN) 10-40 MG per tablet    Sig: Take 1 tablet by mouth at bedtime.    Dispense:  30 tablet    Refill:  11    Order Specific Question:  Supervising Provider    Answer:  Ernestina Penna [[id number]]  . gabapentin (NEURONTIN) 600 MG tablet    Sig: Take 1 tablet (600 mg total) by mouth 2 (two) times daily.    Dispense:  60 tablet    Refill:  11    Order Specific Question:  Supervising Provider    Answer:  Ernestina Penna [[id number]]  . pantoprazole (PROTONIX) 40 MG tablet    Sig: Take 1 tablet (40 mg total) by mouth daily.    Dispense:  30 tablet    Refill:  11    Order Specific Question:  Supervising Provider    Answer:  Ernestina Penna [[id number]]   Patient has eaten today - labs are set up for patient to return for fasting labs

## 2013-03-10 NOTE — Telephone Encounter (Signed)
PT MAY CALL BACK AT HIS CONVINCE.

## 2013-03-10 NOTE — Telephone Encounter (Signed)
Spoke with Michael Mcclure and Michael Mcclure is sending in script for med and has samples of Celebrex 200 mg (#15) at window for pt to pick up.  Pt notified and discussed the Celebrex should be 200 mg once daily or 100 mg twice daily.  But pt is not to take more than 200 mg per day.

## 2013-03-10 NOTE — Telephone Encounter (Signed)
rx filled

## 2013-03-13 ENCOUNTER — Other Ambulatory Visit (INDEPENDENT_AMBULATORY_CARE_PROVIDER_SITE_OTHER): Payer: Medicare Other

## 2013-03-13 DIAGNOSIS — I10 Essential (primary) hypertension: Secondary | ICD-10-CM

## 2013-03-13 DIAGNOSIS — N184 Chronic kidney disease, stage 4 (severe): Secondary | ICD-10-CM

## 2013-03-13 DIAGNOSIS — E785 Hyperlipidemia, unspecified: Secondary | ICD-10-CM

## 2013-03-13 LAB — COMPREHENSIVE METABOLIC PANEL
ALT: 17 U/L (ref 0–53)
AST: 24 U/L (ref 0–37)
Alkaline Phosphatase: 49 U/L (ref 39–117)
Sodium: 142 mEq/L (ref 135–145)
Total Bilirubin: 0.7 mg/dL (ref 0.3–1.2)
Total Protein: 6.5 g/dL (ref 6.0–8.3)

## 2013-03-13 LAB — POCT CBC
Granulocyte percent: 63.9 %G (ref 37–80)
Lymph, poc: 1.6 (ref 0.6–3.4)
MCHC: 33.2 g/dL (ref 31.8–35.4)
MPV: 6.7 fL (ref 0–99.8)
POC Granulocyte: 3.3 (ref 2–6.9)
POC LYMPH PERCENT: 30.5 %L (ref 10–50)
RDW, POC: 13.2 %

## 2013-03-14 LAB — NMR LIPOPROFILE WITH LIPIDS
Cholesterol, Total: 142 mg/dL (ref <200)
HDL Particle Number: 33.6 umol/L (ref >=30.5)
LDL (calc): 79 mg/dL (ref <100)
LP-IR Score: 49 — ABNORMAL HIGH (ref <=45)
Large HDL-P: 4.1 umol/L — ABNORMAL LOW (ref >=4.8)
Large VLDL-P: 1.3 nmol/L (ref <=2.7)
Small LDL Particle Number: 425 nmol/L (ref <=527)

## 2013-03-15 NOTE — Progress Notes (Signed)
Patient came in for labs only.

## 2013-04-19 ENCOUNTER — Telehealth: Payer: Self-pay | Admitting: Family Medicine

## 2013-04-19 NOTE — Telephone Encounter (Signed)
appt given  

## 2013-04-20 ENCOUNTER — Ambulatory Visit (INDEPENDENT_AMBULATORY_CARE_PROVIDER_SITE_OTHER): Payer: Medicare Other | Admitting: Nurse Practitioner

## 2013-04-20 ENCOUNTER — Encounter: Payer: Self-pay | Admitting: Nurse Practitioner

## 2013-04-20 VITALS — BP 138/88 | HR 66 | Temp 97.6°F | Ht 71.0 in | Wt 188.5 lb

## 2013-04-20 DIAGNOSIS — Z125 Encounter for screening for malignant neoplasm of prostate: Secondary | ICD-10-CM

## 2013-04-20 DIAGNOSIS — F329 Major depressive disorder, single episode, unspecified: Secondary | ICD-10-CM

## 2013-04-20 DIAGNOSIS — R339 Retention of urine, unspecified: Secondary | ICD-10-CM

## 2013-04-20 MED ORDER — CITALOPRAM HYDROBROMIDE 20 MG PO TABS: 20.0000 mg | ORAL_TABLET | Freq: Every day | ORAL | Status: DC

## 2013-04-20 NOTE — Progress Notes (Signed)
  Subjective:    Patient ID: Michael Mcclure, male    DOB: 12-May-1932, 79 y.o.   MRN: [id number]  HPI patient in c/o Depression- Says he doesn't feel like doing things anymore- Doesn't want to talk to others, just wants to be left alone. Patient actually lives in [place] and comes and stays with his son here for several months at a time. Getting ready to go back to [facility] first part of June and son is concerned about his depression.  * Patient neds PSA repeated- Patient c/o urinary retention.  Review of Systems  All other systems reviewed and are negative.       Objective:   Physical Exam  Constitutional: He is oriented to person, place, and time. He appears well-developed and well-nourished.  Cardiovascular: Normal rate and normal heart sounds.   Pulmonary/Chest: Effort normal and breath sounds normal.  Neurological: He is alert and oriented to person, place, and time.  Skin: Skin is warm.  Psychiatric: His behavior is normal. Judgment and thought content normal. He exhibits a depressed mood.   BP 145/71  Pulse 66  Temp(Src) 97.6 F (36.4 C) (Oral)  Ht 5\' 11"  (1.803 m)  Wt 188 lb 8 oz (85.503 kg)  BMI 26.3 kg/m2        Assessment & Plan:  1. Depression Stress management Exercise Side effects of meds discussed - citalopram (CELEXA) 20 MG tablet; Take 1 tablet (20 mg total) by mouth daily.  Dispense: 30 tablet; Refill: 3   Mary-Margaret Daphine Deutscher, FNP

## 2013-04-20 NOTE — Patient Instructions (Signed)

## 2013-04-26 ENCOUNTER — Telehealth: Payer: Self-pay | Admitting: *Deleted

## 2013-04-26 MED ORDER — CELECOXIB 200 MG PO CAPS: 200.0000 mg | ORAL_CAPSULE | Freq: Two times a day (BID) | ORAL | Status: DC

## 2013-04-26 NOTE — Telephone Encounter (Signed)
[facility] had entered celebrex rx wrong it was in for celebrex 100 mg bid It should be 200 mg bid- I changed it in epic but please send or "ok" the new/correct at [facility]

## 2013-04-26 NOTE — Telephone Encounter (Signed)
RX sent to pharmacy  

## 2013-04-26 NOTE — Telephone Encounter (Signed)
Pt aware of rx at [facility]

## 2013-05-17 ENCOUNTER — Telehealth: Payer: Self-pay | Admitting: *Deleted

## 2013-05-17 ENCOUNTER — Telehealth: Payer: Self-pay | Admitting: Nurse Practitioner

## 2013-05-17 MED ORDER — DICLOFENAC EPOLAMINE 1.3 % TD PTCH: 1.0000 | MEDICATED_PATCH | Freq: Two times a day (BID) | TRANSDERMAL | Status: DC

## 2013-05-17 MED ORDER — METHOCARBAMOL 750 MG PO TABS: 750.0000 mg | ORAL_TABLET | Freq: Four times a day (QID) | ORAL | Status: DC

## 2013-05-17 NOTE — Telephone Encounter (Signed)
Wants a refill on flector patches and something andrew gave him for cramps unsure of name.

## 2013-05-17 NOTE — Telephone Encounter (Signed)
Need chart I do not know what andrew gave him

## 2013-05-17 NOTE — Telephone Encounter (Signed)
Chart on desk

## 2013-05-17 NOTE — Telephone Encounter (Signed)
reordered

## 2013-05-17 NOTE — Telephone Encounter (Signed)
Pt aware we sent rx to  pharmacy

## 2013-05-17 NOTE — Telephone Encounter (Signed)
Sent in rx for flector patches an drobaxin

## 2013-05-18 NOTE — Telephone Encounter (Signed)
DETAILED MESSAGE LEFT THAT MEDS WERE SENT IN YEST ELECTRONICALLY

## 2013-06-05 ENCOUNTER — Ambulatory Visit: Payer: Medicare Other | Admitting: Nurse Practitioner

## 2014-01-05 ENCOUNTER — Telehealth: Payer: Self-pay | Admitting: *Deleted

## 2014-01-05 MED ORDER — TRIAMCINOLONE 0.1 % CREAM:EUCERIN CREAM 1:1: 1.0000 "application " | TOPICAL_CREAM | Freq: Two times a day (BID) | CUTANEOUS | Status: DC | PRN

## 2014-01-05 NOTE — Telephone Encounter (Signed)
Received fax from pharmacy in [place] requesting refill on triamcinolone cream 0.1% apply to affected area twice a day. If approved [facility] [place] at [phone]

## 2014-01-05 NOTE — Telephone Encounter (Signed)
Rx sent electronically.  

## 2014-02-14 ENCOUNTER — Ambulatory Visit (INDEPENDENT_AMBULATORY_CARE_PROVIDER_SITE_OTHER): Payer: PRIVATE HEALTH INSURANCE

## 2014-02-14 ENCOUNTER — Ambulatory Visit (INDEPENDENT_AMBULATORY_CARE_PROVIDER_SITE_OTHER): Payer: PRIVATE HEALTH INSURANCE | Admitting: Family Medicine

## 2014-02-14 VITALS — BP 122/74 | HR 72 | Temp 96.5°F | Ht 71.0 in | Wt 190.0 lb

## 2014-02-14 DIAGNOSIS — R52 Pain, unspecified: Secondary | ICD-10-CM

## 2014-02-14 DIAGNOSIS — R109 Unspecified abdominal pain: Secondary | ICD-10-CM

## 2014-02-14 DIAGNOSIS — M419 Scoliosis, unspecified: Secondary | ICD-10-CM

## 2014-02-14 DIAGNOSIS — G629 Polyneuropathy, unspecified: Secondary | ICD-10-CM | POA: Insufficient documentation

## 2014-02-14 DIAGNOSIS — M4120 Other idiopathic scoliosis, site unspecified: Secondary | ICD-10-CM

## 2014-02-14 DIAGNOSIS — M25552 Pain in left hip: Secondary | ICD-10-CM

## 2014-02-14 DIAGNOSIS — Q762 Congenital spondylolisthesis: Secondary | ICD-10-CM

## 2014-02-14 DIAGNOSIS — M25559 Pain in unspecified hip: Secondary | ICD-10-CM

## 2014-02-14 DIAGNOSIS — R1032 Left lower quadrant pain: Secondary | ICD-10-CM

## 2014-02-14 DIAGNOSIS — M4310 Spondylolisthesis, site unspecified: Secondary | ICD-10-CM | POA: Insufficient documentation

## 2014-02-14 DIAGNOSIS — G589 Mononeuropathy, unspecified: Secondary | ICD-10-CM

## 2014-02-14 NOTE — Patient Instructions (Addendum)
Continue Celebrex one twice daily after breakfast and supper for the time being Use warm wet compresses to hip and groin area Do range of motion exercises Try to keep from falling again We will await the results of the radiologist interpretation of the films and may consider giving you to see an orthopedic surgeon for possible injection If problems continue we will ask Dr.Roy to check your back also

## 2014-02-14 NOTE — Progress Notes (Signed)
Subjective:    Patient ID: Michael Mcclure, male    DOB: May 16, 1932, 80 y.o.   MRN: [id number]  HPI Patient here today for left hip pain. He fell approximately 1 month ago on ice. The patient backward murmur on his left hip then his back and he has been hurting ever since. The pain is worse when he arises from a sitting position and when he walks. It also hurts not only in the left lateral hip but also in the left groin. He has been taking Celebrex twice daily and this has not relieved his discomfort.      Patient Active Problem List   Diagnosis Date Noted  . GERD (gastroesophageal reflux disease) 03/19/2011  . Hyperlipidemia 03/19/2011  . Neurogenic bladder 03/19/2011  . BPH (benign prostatic hypertrophy) 03/19/2011  . ARTHRITIS 12/06/2008  . BACK PAIN 12/06/2008  . ABDOMINAL PAIN, CHRONIC 12/06/2008  . CARPAL TUNNEL SYNDROME, HX OF 12/06/2008   Outpatient Encounter Prescriptions as of 02/14/2014  Medication Sig  . celecoxib (CELEBREX) 200 MG capsule Take 1 capsule (200 mg total) by mouth 2 (two) times daily.  . clopidogrel (PLAVIX) 75 MG tablet Take 1 tablet (75 mg total) by mouth daily.  . diclofenac (FLECTOR) 1.3 % PTCH Place 1 patch onto the skin 2 (two) times daily.  Marland Kitchen ezetimibe-simvastatin (VYTORIN) 10-40 MG per tablet Take 1 tablet by mouth at bedtime.  . gabapentin (NEURONTIN) 600 MG tablet Take 1 tablet (600 mg total) by mouth 2 (two) times daily.  . hydrocortisone-pramoxine (ANALPRAM-HC) 2.5-1 % rectal cream USE AS DIRECTED 3 TIMES A DAY RECTALLY  . levothyroxine (SYNTHROID, LEVOTHROID) 25 MCG tablet Take 1 tablet (25 mcg total) by mouth daily.  . pantoprazole (PROTONIX) 40 MG tablet Take 1 tablet (40 mg total) by mouth daily.  . Triamcinolone Acetonide (TRIAMCINOLONE 0.1 % CREAM : EUCERIN) CREA Apply 1 application topically 2 (two) times daily as needed.  Marland Kitchen aspirin 81 MG tablet Take 81 mg by mouth daily.  . [DISCONTINUED] citalopram (CELEXA) 20 MG tablet Take 1 tablet  (20 mg total) by mouth daily.  . [DISCONTINUED] methocarbamol (ROBAXIN-750) 750 MG tablet Take 1 tablet (750 mg total) by mouth 4 (four) times daily.    Review of Systems  Constitutional: Negative.   HENT: Negative.   Eyes: Negative.   Respiratory: Negative.   Cardiovascular: Negative.   Gastrointestinal: Negative.   Endocrine: Negative.   Genitourinary: Negative.   Musculoskeletal: Positive for arthralgias (left hip pain).  Skin: Negative.   Allergic/Immunologic: Negative.   Neurological: Negative.   Hematological: Negative.   Psychiatric/Behavioral: Negative.        Objective:   Physical Exam  Nursing note and vitals reviewed. Constitutional: He appears well-developed and well-nourished. No distress.  For his age  HENT:  Head: Normocephalic.  Eyes: Conjunctivae and EOM are normal. Pupils are equal, round, and reactive to light. Right eye exhibits no discharge. Left eye exhibits no discharge. No scleral icterus.  Neck: Normal range of motion.  Genitourinary:  Both testicles were normal and there was no inguinal hernia palpated. There were no groin masses  Musculoskeletal: Normal range of motion. He exhibits tenderness. He exhibits no edema.  Leg raising and hip abduction are good bilaterally except there is increased upper medial thigh pain on the left with leg raising and abduction of the left hip. There is tenderness to palpation in the left lateral hip. There are no lumps or masses.  Neurological: He is alert. He has normal reflexes.  Skin: Skin is warm and dry. No rash noted.  Psychiatric: He has a normal mood and affect. His behavior is normal. Judgment and thought content normal.   BP 122/74  Pulse 72  Temp(Src) 96.5 F (35.8 C) (Oral)  Ht 5\' 11"  (1.803 m)  Wt 190 lb (86.183 kg)  BMI 26.51 kg/m2  [facility] reading (PRIMARY) by  Dr. Moore-degenerative changes left hip                                                                    LS spine-- scoliosis,  degenerative changes and anterior listhesis                                      Assessment & Plan:  1. Pain - DG Hip Complete Left - DG Lumbar Spine 2-3 Views; Future  2. Left hip pain  3. Left groin pain  4. Neuropathy  5. Lumbar scoliosis  6. Anterolisthesis  Patient Instructions  Continue Celebrex one twice daily after breakfast and supper for the time being Use warm wet compresses to hip and groin area Do range of motion exercises Try to keep from falling again We will await the results of the radiologist interpretation of the films and may consider giving you to see an orthopedic surgeon for possible injection If problems continue we will ask Dr.Roy to check your back also   Nyra Capeson W. Moore MD

## 2014-02-20 ENCOUNTER — Encounter: Payer: PRIVATE HEALTH INSURANCE | Admitting: Family Medicine

## 2014-02-20 ENCOUNTER — Other Ambulatory Visit: Payer: Self-pay | Admitting: *Deleted

## 2014-02-20 DIAGNOSIS — S32599A Other specified fracture of unspecified pubis, initial encounter for closed fracture: Secondary | ICD-10-CM

## 2014-02-20 NOTE — Progress Notes (Signed)
xrays discussed with pt today

## 2014-02-20 NOTE — Progress Notes (Signed)
Patient ID: Michael Mcclure, male   DOB: 18-Jun-1932, 80 y.o.   MRN: [id number] Error in charting. Not a full visit.

## 2014-03-01 ENCOUNTER — Telehealth: Payer: Self-pay | Admitting: Family Medicine

## 2014-03-01 ENCOUNTER — Encounter: Payer: Self-pay | Admitting: *Deleted

## 2014-03-01 NOTE — Telephone Encounter (Signed)
Patient wanted to know what activities he can participate in with his hip/back pain. He normally walks 2 miles a day. He hasn't seen ortho yet. Advised him to participate as tolerated. If something hurts then stop.

## 2014-03-02 ENCOUNTER — Encounter: Payer: Self-pay | Admitting: Physician Assistant

## 2014-03-02 ENCOUNTER — Ambulatory Visit (INDEPENDENT_AMBULATORY_CARE_PROVIDER_SITE_OTHER): Payer: PRIVATE HEALTH INSURANCE | Admitting: Physician Assistant

## 2014-03-02 VITALS — BP 143/73 | HR 76 | Temp 97.3°F | Ht 71.0 in | Wt 190.0 lb

## 2014-03-02 DIAGNOSIS — Z760 Encounter for issue of repeat prescription: Secondary | ICD-10-CM

## 2014-03-02 DIAGNOSIS — E039 Hypothyroidism, unspecified: Secondary | ICD-10-CM

## 2014-03-02 DIAGNOSIS — M549 Dorsalgia, unspecified: Secondary | ICD-10-CM

## 2014-03-02 LAB — POCT URINALYSIS DIPSTICK
BILIRUBIN UA: NEGATIVE
Blood, UA: NEGATIVE
Glucose, UA: NEGATIVE
Ketones, UA: NEGATIVE
Leukocytes, UA: NEGATIVE
Nitrite, UA: NEGATIVE
Protein, UA: NEGATIVE
Spec Grav, UA: 1.01
UROBILINOGEN UA: NEGATIVE
pH, UA: 6.5

## 2014-03-02 LAB — POCT UA - MICROSCOPIC ONLY
BACTERIA, U MICROSCOPIC: NEGATIVE
CASTS, UR, LPF, POC: NEGATIVE
Crystals, Ur, HPF, POC: NEGATIVE
Mucus, UA: NEGATIVE
YEAST UA: NEGATIVE

## 2014-03-02 MED ORDER — CLOPIDOGREL BISULFATE 75 MG PO TABS: 75.0000 mg | ORAL_TABLET | Freq: Every day | ORAL | Status: DC

## 2014-03-02 MED ORDER — PANTOPRAZOLE SODIUM 40 MG PO TBEC: 40.0000 mg | DELAYED_RELEASE_TABLET | Freq: Every day | ORAL | Status: DC

## 2014-03-02 MED ORDER — DICLOFENAC EPOLAMINE 1.3 % TD PTCH: 1.0000 | MEDICATED_PATCH | Freq: Two times a day (BID) | TRANSDERMAL | Status: DC

## 2014-03-02 MED ORDER — TRIAMCINOLONE 0.1 % CREAM:EUCERIN CREAM 1:1: 1.0000 "application " | TOPICAL_CREAM | Freq: Two times a day (BID) | CUTANEOUS | Status: DC | PRN

## 2014-03-02 MED ORDER — EZETIMIBE-SIMVASTATIN 10-40 MG PO TABS: 1.0000 | ORAL_TABLET | Freq: Every day | ORAL | Status: DC

## 2014-03-02 MED ORDER — LEVOTHYROXINE SODIUM 25 MCG PO TABS: 25.0000 ug | ORAL_TABLET | Freq: Every day | ORAL | Status: DC

## 2014-03-02 MED ORDER — CELECOXIB 200 MG PO CAPS: 200.0000 mg | ORAL_CAPSULE | Freq: Two times a day (BID) | ORAL | Status: DC

## 2014-03-02 MED ORDER — GABAPENTIN 600 MG PO TABS: 600.0000 mg | ORAL_TABLET | Freq: Two times a day (BID) | ORAL | Status: DC

## 2014-03-02 NOTE — Patient Instructions (Signed)
Follow up in 1-2 months with your full time provider for full physical and labs

## 2014-03-12 ENCOUNTER — Other Ambulatory Visit (INDEPENDENT_AMBULATORY_CARE_PROVIDER_SITE_OTHER): Payer: PRIVATE HEALTH INSURANCE

## 2014-03-12 DIAGNOSIS — E559 Vitamin D deficiency, unspecified: Secondary | ICD-10-CM

## 2014-03-12 DIAGNOSIS — I10 Essential (primary) hypertension: Secondary | ICD-10-CM

## 2014-03-12 DIAGNOSIS — E039 Hypothyroidism, unspecified: Secondary | ICD-10-CM

## 2014-03-12 NOTE — Progress Notes (Signed)
Patient here for labs only  Per tiffany gann

## 2014-03-13 LAB — CBC WITH DIFFERENTIAL
BASOS ABS: 0 10*3/uL (ref 0.0–0.2)
Basos: 1 %
Eos: 5 %
Eosinophils Absolute: 0.2 10*3/uL (ref 0.0–0.4)
HEMATOCRIT: 41.2 % (ref 37.5–51.0)
Hemoglobin: 14.1 g/dL (ref 12.6–17.7)
IMMATURE GRANS (ABS): 0 10*3/uL (ref 0.0–0.1)
IMMATURE GRANULOCYTES: 0 %
LYMPHS: 29 %
Lymphocytes Absolute: 1.3 10*3/uL (ref 0.7–3.1)
MCH: 30.5 pg (ref 26.6–33.0)
MCHC: 34.2 g/dL (ref 31.5–35.7)
MCV: 89 fL (ref 79–97)
MONOCYTES: 8 %
Monocytes Absolute: 0.4 10*3/uL (ref 0.1–0.9)
NEUTROS ABS: 2.4 10*3/uL (ref 1.4–7.0)
NEUTROS PCT: 57 %
Platelets: 160 10*3/uL (ref 150–379)
RBC: 4.63 x10E6/uL (ref 4.14–5.80)
RDW: 12.8 % (ref 12.3–15.4)
WBC: 4.3 10*3/uL (ref 3.4–10.8)

## 2014-03-13 LAB — CMP14+EGFR
ALBUMIN: 4.4 g/dL (ref 3.5–4.8)
ALT: 16 IU/L (ref 0–44)
AST: 21 IU/L (ref 0–40)
Albumin/Globulin Ratio: 2.2 (ref 1.1–2.5)
Alkaline Phosphatase: 62 IU/L (ref 39–117)
BUN/Creatinine Ratio: 17 (ref 10–22)
BUN: 20 mg/dL (ref 8–27)
CALCIUM: 9.2 mg/dL (ref 8.6–10.2)
CHLORIDE: 106 mmol/L (ref 97–108)
CO2: 21 mmol/L (ref 18–29)
Creatinine, Ser: 1.15 mg/dL (ref 0.76–1.27)
GFR calc non Af Amer: 60 mL/min/{1.73_m2} (ref >59)
GFR, EST AFRICAN AMERICAN: 70 mL/min/{1.73_m2} (ref >59)
GLUCOSE: 97 mg/dL (ref 65–99)
Globulin, Total: 2 g/dL (ref 1.5–4.5)
POTASSIUM: 4.3 mmol/L (ref 3.5–5.2)
Sodium: 143 mmol/L (ref 134–144)
Total Bilirubin: 0.7 mg/dL (ref 0.0–1.2)
Total Protein: 6.4 g/dL (ref 6.0–8.5)

## 2014-03-13 LAB — VITAMIN D 25 HYDROXY (VIT D DEFICIENCY, FRACTURES): VIT D 25 HYDROXY: 35.1 ng/mL (ref 30.0–100.0)

## 2014-03-13 LAB — LIPID PANEL
CHOLESTEROL TOTAL: 145 mg/dL (ref 100–199)
Chol/HDL Ratio: 3.5 ratio units (ref 0.0–5.0)
HDL: 42 mg/dL (ref >39)
LDL CALC: 82 mg/dL (ref 0–99)
TRIGLYCERIDES: 103 mg/dL (ref 0–149)
VLDL CHOLESTEROL CAL: 21 mg/dL (ref 5–40)

## 2014-03-13 LAB — THYROID PANEL WITH TSH
Free Thyroxine Index: 1.9 (ref 1.2–4.9)
T3 Uptake Ratio: 35 % (ref 24–39)
T4, Total: 5.3 ug/dL (ref 4.5–12.0)
TSH: 3.66 u[IU]/mL (ref 0.450–4.500)

## 2014-03-19 NOTE — Progress Notes (Signed)
   Subjective:    Patient ID: Michael Mcclure, male    DOB: Dec 10, 1931, 80 y.o.   MRN: [id number]  HPI 80 y/o male with many comorbidities  Including GERD, Neuropathy, Arthritis, back pain, chronic abdominal pain, hyperlipidemia, hypothyroidism and hx of MI presents today for refills on all of his medications.     Review of Systems  Constitutional: Negative.   HENT: Negative.   Eyes: Negative.   Respiratory: Negative.   Cardiovascular: Negative.   Gastrointestinal: Positive for abdominal pain.  Genitourinary: Positive for frequency. Negative for hematuria, flank pain and enuresis.  Musculoskeletal: Positive for arthralgias, back pain, joint swelling and myalgias.       Objective:   Physical Exam  Nursing note and vitals reviewed. Constitutional: He is oriented to person, place, and time. He appears well-developed and well-nourished. No distress.  Cardiovascular: Normal rate, regular rhythm and normal heart sounds.  Exam reveals no gallop and no friction rub.   No murmur heard. Pulmonary/Chest: Effort normal and breath sounds normal. No respiratory distress. He has no wheezes.  Abdominal: Soft. Bowel sounds are normal. He exhibits no distension. There is no tenderness. There is no rebound.  Neurological: He is alert and oriented to person, place, and time.  Skin: He is not diaphoretic.  Psychiatric: He has a normal mood and affect. His behavior is normal. Judgment and thought content normal.          Assessment & Plan:  1. Medication refills were given with no refills. I have advised him that he needs to rtc to see his PCP within the next month for a full laboratory workup and wellness physical for re-evaluation of comorbidities. He agrees to do so. No further refills should be given without a full work up.

## 2014-03-23 ENCOUNTER — Encounter: Payer: Self-pay | Admitting: Family Medicine

## 2014-03-23 ENCOUNTER — Ambulatory Visit (INDEPENDENT_AMBULATORY_CARE_PROVIDER_SITE_OTHER): Payer: PRIVATE HEALTH INSURANCE | Admitting: Family Medicine

## 2014-03-23 ENCOUNTER — Ambulatory Visit (INDEPENDENT_AMBULATORY_CARE_PROVIDER_SITE_OTHER): Payer: PRIVATE HEALTH INSURANCE

## 2014-03-23 VITALS — BP 131/68 | HR 76 | Temp 97.1°F | Ht 71.0 in | Wt 190.0 lb

## 2014-03-23 DIAGNOSIS — N451 Epididymitis: Secondary | ICD-10-CM

## 2014-03-23 DIAGNOSIS — M25559 Pain in unspecified hip: Secondary | ICD-10-CM

## 2014-03-23 DIAGNOSIS — N453 Epididymo-orchitis: Secondary | ICD-10-CM

## 2014-03-23 DIAGNOSIS — M25552 Pain in left hip: Secondary | ICD-10-CM

## 2014-03-23 LAB — POCT UA - MICROSCOPIC ONLY
Bacteria, U Microscopic: NEGATIVE
Casts, Ur, LPF, POC: NEGATIVE
Crystals, Ur, HPF, POC: NEGATIVE
Mucus, UA: NEGATIVE
RBC, urine, microscopic: NEGATIVE
WBC, Ur, HPF, POC: NEGATIVE
Yeast, UA: NEGATIVE

## 2014-03-23 LAB — POCT URINALYSIS DIPSTICK
Bilirubin, UA: NEGATIVE
Blood, UA: NEGATIVE
Glucose, UA: NEGATIVE
Ketones, UA: NEGATIVE
Leukocytes, UA: NEGATIVE
Nitrite, UA: NEGATIVE
Protein, UA: NEGATIVE
Spec Grav, UA: 1.01
Urobilinogen, UA: NEGATIVE
pH, UA: 6

## 2014-03-23 MED ORDER — HYDROCODONE-ACETAMINOPHEN 5-325 MG PO TABS: 1.0000 | ORAL_TABLET | Freq: Four times a day (QID) | ORAL | Status: DC | PRN

## 2014-03-23 MED ORDER — CIPROFLOXACIN HCL 500 MG PO TABS: 500.0000 mg | ORAL_TABLET | Freq: Two times a day (BID) | ORAL | Status: DC

## 2014-03-23 NOTE — Progress Notes (Signed)
   Subjective:    Patient ID: Michael Mcclure, male    DOB: 15-Feb-1932, 80 y.o.   MRN: [id number]  HPI This 80 y.o. male presents for evaluation of left testicular discomfort and pain.  He states He has occasional pain in his left testicle that radiate to his left inguinal region.  He has  A left pubic ramus fracture and is going to see orthopedics.  [facility] and has been Having some left hip pain.  He states the pain in his testicle is different that the left hip pain. The left testicle pain has been present for the last few days.   Review of Systems Clo left testicular pain   No chest pain, SOB, HA, dizziness, vision change, N/V, diarrhea, constipation, dysuria, urinary urgency or frequency, myalgias, arthralgias or rash.  Objective:   Physical Exam  Vital signs noted  Well developed well nourished male.  HEENT - Head atraumatic Normocephalic                Eyes - PERRLA, Conjuctiva - clear Sclera- Clear EOMI                Ears - EAC's Wnl TM's Wnl Gross Hearing WNL                Throat - oropharanx wnl Respiratory - Lungs CTA bilateral Cardiac - RRR S1 and S2 without murmur GU - Tenderness left testicle and swelling left teste, no inguinal hernia      Assessment & Plan:  Left hip pain - Plan: DG Hip Complete Left  Epididymitis - Plan: ciprofloxacin (CIPRO) 500 MG tablet, HYDROcodone-acetaminophen (NORCO) 5-325 MG per tablet, POCT UA - Microscopic Only, POCT urinalysis dipstick, Urine culture Follow up prn and if having severe intractable pain then go to ED  Deatra CanterWilliam J Oxford FNP

## 2014-03-24 LAB — URINE CULTURE: Organism ID, Bacteria: NO GROWTH

## 2014-03-28 ENCOUNTER — Telehealth: Payer: Self-pay | Admitting: Family Medicine

## 2014-03-28 DIAGNOSIS — S32599A Other specified fracture of unspecified pubis, initial encounter for closed fracture: Secondary | ICD-10-CM

## 2014-03-28 NOTE — Telephone Encounter (Signed)
Continues to have pelvic pain and has seen little improvement. Previous office note states that he is seeing ortho for this but he is not. He is seeing neurosurgery for his back. He was asked to report back to Dr Christell ConstantMoore about his pain level and improvement.  He was unable to take the pain medication [facility]. It was too strong and he threw it away.

## 2014-03-28 NOTE — Telephone Encounter (Signed)
Please get an appointment for him to see the orthopedic surgeon as soon as possible

## 2014-03-28 NOTE — Telephone Encounter (Signed)
Referral placed and patient aware 

## 2014-04-06 ENCOUNTER — Other Ambulatory Visit: Payer: Self-pay | Admitting: Family Medicine

## 2014-04-09 NOTE — Telephone Encounter (Signed)
Saw Michael Dawleyiffany Gann for this before

## 2014-04-16 ENCOUNTER — Other Ambulatory Visit: Payer: Self-pay | Admitting: Family Medicine

## 2014-04-16 ENCOUNTER — Telehealth: Payer: Self-pay | Admitting: Family Medicine

## 2014-04-16 DIAGNOSIS — S329XXA Fracture of unspecified parts of lumbosacral spine and pelvis, initial encounter for closed fracture: Secondary | ICD-10-CM

## 2014-04-16 NOTE — Telephone Encounter (Signed)
Ortho referral put in urgent for pelvic pain and hx of recent pelvic fracture.

## 2014-04-26 ENCOUNTER — Telehealth: Payer: Self-pay | Admitting: Family Medicine

## 2014-04-26 ENCOUNTER — Encounter: Payer: Self-pay | Admitting: Family Medicine

## 2014-04-26 ENCOUNTER — Ambulatory Visit (INDEPENDENT_AMBULATORY_CARE_PROVIDER_SITE_OTHER): Payer: PRIVATE HEALTH INSURANCE | Admitting: Family Medicine

## 2014-04-26 VITALS — BP 145/73 | HR 73 | Temp 96.9°F | Wt 189.6 lb

## 2014-04-26 DIAGNOSIS — E039 Hypothyroidism, unspecified: Secondary | ICD-10-CM

## 2014-04-26 DIAGNOSIS — L0291 Cutaneous abscess, unspecified: Secondary | ICD-10-CM

## 2014-04-26 DIAGNOSIS — K219 Gastro-esophageal reflux disease without esophagitis: Secondary | ICD-10-CM

## 2014-04-26 DIAGNOSIS — I6529 Occlusion and stenosis of unspecified carotid artery: Secondary | ICD-10-CM

## 2014-04-26 DIAGNOSIS — F411 Generalized anxiety disorder: Secondary | ICD-10-CM

## 2014-04-26 DIAGNOSIS — M129 Arthropathy, unspecified: Secondary | ICD-10-CM

## 2014-04-26 DIAGNOSIS — R21 Rash and other nonspecific skin eruption: Secondary | ICD-10-CM

## 2014-04-26 DIAGNOSIS — M1990 Unspecified osteoarthritis, unspecified site: Secondary | ICD-10-CM

## 2014-04-26 DIAGNOSIS — L0390 Cellulitis, unspecified: Secondary | ICD-10-CM

## 2014-04-26 MED ORDER — PANTOPRAZOLE SODIUM 40 MG PO TBEC: 40.0000 mg | DELAYED_RELEASE_TABLET | Freq: Every day | ORAL | Status: DC

## 2014-04-26 MED ORDER — CITALOPRAM HYDROBROMIDE 20 MG PO TABS: 20.0000 mg | ORAL_TABLET | Freq: Every day | ORAL | Status: DC

## 2014-04-26 MED ORDER — CLOPIDOGREL BISULFATE 75 MG PO TABS: 75.0000 mg | ORAL_TABLET | Freq: Every day | ORAL | Status: DC

## 2014-04-26 MED ORDER — LEVOTHYROXINE SODIUM 25 MCG PO TABS: 25.0000 ug | ORAL_TABLET | Freq: Every day | ORAL | Status: DC

## 2014-04-26 MED ORDER — CELECOXIB 200 MG PO CAPS: 200.0000 mg | ORAL_CAPSULE | Freq: Two times a day (BID) | ORAL | Status: DC

## 2014-04-26 MED ORDER — DOXYCYCLINE HYCLATE 100 MG PO TABS: 100.0000 mg | ORAL_TABLET | Freq: Two times a day (BID) | ORAL | Status: DC

## 2014-04-26 MED ORDER — METHYLPREDNISOLONE ACETATE 80 MG/ML IJ SUSP
80.0000 mg | Freq: Once | INTRAMUSCULAR | Status: AC
  Administered 2014-04-26: 80 mg via INTRAMUSCULAR

## 2014-04-26 MED ORDER — GABAPENTIN 600 MG PO TABS: 600.0000 mg | ORAL_TABLET | Freq: Two times a day (BID) | ORAL | Status: DC

## 2014-04-26 NOTE — Progress Notes (Signed)
   Subjective:    Patient ID: Michael Mcclure, male    DOB: 1932-04-23, 80 y.o.   MRN: [id number]  HPI  This 80 y.o. male presents for evaluation of rash and right groin with tick bite that is not healing. He has been having anxiety problems.  Review of Systems    No chest pain, SOB, HA, dizziness, vision change, N/V, diarrhea, constipation, dysuria, urinary urgency or frequency, myalgias, arthralgias or rash.  Objective:   Physical Exam Vital signs noted  Well developed well nourished male.  HEENT - Head atraumatic Normocephalic                Eyes - PERRLA, Conjuctiva - clear Sclera- Clear EOMI                Ears - EAC's Wnl TM's Wnl Gross Hearing WNL                Nose - Nares patent                 Throat - oropharanx wnl Respiratory - Lungs CTA bilateral Cardiac - RRR S1 and S2 without murmur GI - Abdomen soft Nontender and bowel sounds active x 4 Extremities - No edema. Neuro - Grossly intact. Skin - macular papular rash on legs and right groin with small nonhealing ulcer from tick bite.     Assessment & Plan:  Unspecified hypothyroidism - Plan: levothyroxine (SYNTHROID, LEVOTHROID) 25 MCG tablet  Cellulitis - Plan: doxycycline (VIBRA-TABS) 100 MG tablet  Anxiety state, unspecified - Plan: citalopram (CELEXA) 20 MG tablet  Carotid artery stenosis - Plan: clopidogrel (PLAVIX) 75 MG tablet  GERD (gastroesophageal reflux disease) - Plan: pantoprazole (PROTONIX) 40 MG tablet  Arthritis - Plan: celecoxib (CELEBREX) 200 MG capsule, gabapentin (NEURONTIN) 600 MG tablet  Rash and nonspecific skin eruption - Plan: methylPREDNISolone acetate (DEPO-MEDROL) injection 80 mg  Deatra CanterWilliam J Oxford FNP

## 2014-05-07 ENCOUNTER — Other Ambulatory Visit: Payer: Self-pay | Admitting: Family Medicine

## 2014-05-07 MED ORDER — EZETIMIBE-SIMVASTATIN 10-40 MG PO TABS: 1.0000 | ORAL_TABLET | Freq: Every day | ORAL | Status: DC

## 2014-05-07 NOTE — Telephone Encounter (Signed)
Sent vytorin to [facility]

## 2014-05-30 ENCOUNTER — Encounter: Payer: Self-pay | Admitting: Family Medicine

## 2014-05-30 ENCOUNTER — Other Ambulatory Visit: Payer: Self-pay | Admitting: *Deleted

## 2014-05-30 ENCOUNTER — Ambulatory Visit (INDEPENDENT_AMBULATORY_CARE_PROVIDER_SITE_OTHER): Payer: PRIVATE HEALTH INSURANCE | Admitting: Family Medicine

## 2014-05-30 ENCOUNTER — Telehealth: Payer: Self-pay | Admitting: Family Medicine

## 2014-05-30 VITALS — BP 118/67 | HR 78 | Temp 97.6°F | Ht 71.0 in | Wt 182.2 lb

## 2014-05-30 DIAGNOSIS — R21 Rash and other nonspecific skin eruption: Secondary | ICD-10-CM

## 2014-05-30 DIAGNOSIS — M25519 Pain in unspecified shoulder: Secondary | ICD-10-CM

## 2014-05-30 MED ORDER — DICLOFENAC EPOLAMINE 1.3 % TD PTCH: 1.0000 | MEDICATED_PATCH | Freq: Two times a day (BID) | TRANSDERMAL | Status: DC

## 2014-05-30 MED ORDER — TRIAMCINOLONE 0.1 % CREAM:EUCERIN CREAM 1:1: 1.0000 "application " | TOPICAL_CREAM | Freq: Two times a day (BID) | CUTANEOUS | Status: DC | PRN

## 2014-05-30 NOTE — Progress Notes (Signed)
   Subjective:    Patient ID: Michael Mcclure, male    DOB: Jul 23, 1932, 80 y.o.   MRN: [id number]  HPI This 80 y.o. male presents for evaluation of follow up on anxiety.  He was placed on celexa for depression a month ago and is feeling better.  He has chronic shoulder pain and is taking flector patches and needs refills.  He has rash on his legs and uses rx hydrocortisone cream and needs refills. He sees Dr. Weldon Inchestoole for chronic pain and Dr. Channing Muttersoy neurosurgery for DDD of the lumbar spine.   Review of Systems C/o shoulder discomfort and rash No chest pain, SOB, HA, dizziness, vision change, N/V, diarrhea, constipation, dysuria, urinary urgency or frequency.     Objective:   Physical Exam  Vital signs noted  Well developed well nourished male.  HEENT - Head atraumatic Normocephalic                Eyes - PERRLA, Conjuctiva - clear Sclera- Clear EOMI                Ears - EAC's Wnl TM's Wnl Gross Hearing WNL                Throat - oropharanx wnl Respiratory - Lungs CTA bilateral Cardiac - RRR S1 and S2 without murmur GI - Abdomen soft Nontender and bowel sounds active x 4 MS - TTP bilateral shoulders     Assessment & Plan:  Rash and nonspecific skin eruption - Plan: Triamcinolone Acetonide (TRIAMCINOLONE 0.1 % CREAM : EUCERIN) CREA, diclofenac (FLECTOR) 1.3 % PTCH  Pain in joint, shoulder region, unspecified laterality - Plan: Triamcinolone Acetonide (TRIAMCINOLONE 0.1 % CREAM : EUCERIN) CREA, diclofenac (FLECTOR) 1.3 % PTCH  Depression - Controlled and continue celexa and follow up in 6 months  Deatra CanterWilliam J Oxford FNP

## 2014-05-30 NOTE — Telephone Encounter (Signed)
Needed quantity for triamcinolone cream and ratio of triamcinolone to Eucerin. Advised 60g tube and 1:1 ratio.

## 2014-06-02 ENCOUNTER — Other Ambulatory Visit: Payer: Self-pay | Admitting: *Deleted

## 2014-06-02 NOTE — Telephone Encounter (Signed)
Patient wants to know if we can send in rx for triamcinolone cream 0.1% instead of the compound.

## 2014-06-04 MED ORDER — TRIAMCINOLONE ACETONIDE 0.1 % EX CREA: TOPICAL_CREAM | Freq: Two times a day (BID) | CUTANEOUS | Status: DC

## 2014-06-13 ENCOUNTER — Encounter: Payer: Self-pay | Admitting: Family Medicine

## 2014-06-13 ENCOUNTER — Ambulatory Visit (INDEPENDENT_AMBULATORY_CARE_PROVIDER_SITE_OTHER): Payer: PRIVATE HEALTH INSURANCE | Admitting: Family Medicine

## 2014-06-13 ENCOUNTER — Ambulatory Visit (INDEPENDENT_AMBULATORY_CARE_PROVIDER_SITE_OTHER): Payer: PRIVATE HEALTH INSURANCE

## 2014-06-13 VITALS — BP 118/66 | HR 71 | Temp 96.3°F | Ht 71.0 in | Wt 184.4 lb

## 2014-06-13 DIAGNOSIS — M542 Cervicalgia: Secondary | ICD-10-CM

## 2014-06-13 DIAGNOSIS — L989 Disorder of the skin and subcutaneous tissue, unspecified: Secondary | ICD-10-CM

## 2014-06-13 MED ORDER — METHOCARBAMOL 750 MG PO TABS: 750.0000 mg | ORAL_TABLET | Freq: Four times a day (QID) | ORAL | Status: DC

## 2014-06-13 NOTE — Progress Notes (Signed)
   Subjective:    Patient ID: Michael Mcclure, male    DOB: July 29, 1932, 80 y.o.   MRN: [id number]  HPI    Review of Systems     Objective:   Physical Exam   Cervical Spine xray - DDD of cervical spine Prelimnary reading by Angeline SlimWilliam Makaiya Geerdes,FNP    Assessment & Plan:

## 2014-06-13 NOTE — Progress Notes (Signed)
   Subjective:    Patient ID: Michael Mcclure, male    DOB: 09-Apr-1932, 80 y.o.   MRN: [id number]  HPI  C/o left cervicalgia and discomfort in his neck for 2 days. C/o skin lesion on nose that is not healing and has been there for a month.  He has hx of skin cancer.  He has been bruising in his legs and his arms.  He takes plavix for carotid artery stenosis.  Review of Systems C/o neck discomfort, bruising, and skin lesion on nose No chest pain, SOB, HA, dizziness, vision change, N/V, diarrhea, constipation or rash.     Objective:   Physical Exam  Vital signs noted  Well developed well nourished male.  HEENT - Head atraumatic Normocephalic                Eyes - PERRLA, Conjuctiva - clear Sclera- Clear EOMI                Ears - EAC's Wnl TM's Wnl Gross Hearing WNL                Nose - Nares patent skin lesion on nose                Throat - oropharanx wnl Respiratory - Lungs CTA bilateral Cardiac - RRR S1 and S2 without murmur GI - Abdomen soft Nontender and bowel sounds active x 4 Extremities - No edema. Neuro - Grossly intact. Skin - Skin lesion erythematous on nose tip.  Bruising left thigh     Assessment & Plan:  Cervicalgia - Plan: methocarbamol (ROBAXIN-750) 750 MG tablet, DG Cervical Spine Complete  Skin lesion of face - Plan: Ambulatory referral to Dermatology  Bruising - Continue plavix and reassured  Deatra CanterWilliam J Jasiah Elsen FNP

## 2014-06-26 ENCOUNTER — Other Ambulatory Visit: Payer: Self-pay | Admitting: Family Medicine

## 2014-08-27 ENCOUNTER — Ambulatory Visit (INDEPENDENT_AMBULATORY_CARE_PROVIDER_SITE_OTHER): Payer: PRIVATE HEALTH INSURANCE | Admitting: Family Medicine

## 2014-08-27 ENCOUNTER — Encounter: Payer: Self-pay | Admitting: Family Medicine

## 2014-08-27 VITALS — BP 138/63 | HR 60 | Temp 97.4°F | Ht 71.0 in | Wt 186.0 lb

## 2014-08-27 DIAGNOSIS — M1990 Unspecified osteoarthritis, unspecified site: Secondary | ICD-10-CM

## 2014-08-27 DIAGNOSIS — L259 Unspecified contact dermatitis, unspecified cause: Secondary | ICD-10-CM

## 2014-08-27 DIAGNOSIS — M129 Arthropathy, unspecified: Secondary | ICD-10-CM

## 2014-08-27 DIAGNOSIS — L989 Disorder of the skin and subcutaneous tissue, unspecified: Secondary | ICD-10-CM

## 2014-08-27 DIAGNOSIS — L309 Dermatitis, unspecified: Secondary | ICD-10-CM

## 2014-08-27 MED ORDER — METHYLPREDNISOLONE ACETATE 80 MG/ML IJ SUSP
80.0000 mg | Freq: Once | INTRAMUSCULAR | Status: AC
  Administered 2014-08-27: 80 mg via INTRAMUSCULAR

## 2014-08-27 MED ORDER — CELECOXIB 200 MG PO CAPS: 200.0000 mg | ORAL_CAPSULE | Freq: Two times a day (BID) | ORAL | Status: DC

## 2014-08-27 NOTE — Progress Notes (Signed)
   Subjective:    Patient ID: ADIS STURGILL, male    DOB: 1932/04/10, 80 y.o.   MRN: [id number]  HPI This 80 y.o. male presents for evaluation of poison oak dermatitis left leg.  He has been having A skin lesion on his right index finger that is tender.  He wants referral to dermatologist.  He Wants refill on celebrex..   Review of Systems No chest pain, SOB, HA, dizziness, vision change, N/V, diarrhea, constipation, dysuria, urinary urgency or frequency, myalgias, arthralgias or rash.     Objective:   Physical Exam Vital signs noted  Well developed well nourished male.  HEENT - Head atraumatic Normocephalic Respiratory - Lungs CTA bilateral Cardiac - RRR S1 and S2 without murmur GI - Abdomen soft Nontender and bowel sounds active x 4 Extremities - No edema. Neuro - Grossly intact. Skin - Erythematous macular papular rash left leg, knee, and thigh.   Tan circular skin lesion right index finger.      Assessment & Plan:  Skin lesion - Plan: Ambulatory referral to Dermatology  Dermatitis - Plan: methylPREDNISolone acetate (DEPO-MEDROL) injection 80 mg  Arthritis - Plan: celecoxib (CELEBREX) 200 MG capsule po bid  Deatra Canter FNP

## 2014-09-20 ENCOUNTER — Telehealth: Payer: Self-pay | Admitting: Family Medicine

## 2014-09-20 ENCOUNTER — Other Ambulatory Visit: Payer: PRIVATE HEALTH INSURANCE

## 2014-09-20 ENCOUNTER — Ambulatory Visit (INDEPENDENT_AMBULATORY_CARE_PROVIDER_SITE_OTHER): Payer: PRIVATE HEALTH INSURANCE | Admitting: Family Medicine

## 2014-09-20 ENCOUNTER — Other Ambulatory Visit: Payer: Self-pay | Admitting: Family Medicine

## 2014-09-20 VITALS — BP 131/72 | HR 65 | Temp 97.2°F | Ht 71.0 in | Wt 185.6 lb

## 2014-09-20 DIAGNOSIS — E785 Hyperlipidemia, unspecified: Secondary | ICD-10-CM

## 2014-09-20 DIAGNOSIS — R21 Rash and other nonspecific skin eruption: Secondary | ICD-10-CM

## 2014-09-20 DIAGNOSIS — K640 First degree hemorrhoids: Secondary | ICD-10-CM

## 2014-09-20 DIAGNOSIS — M25519 Pain in unspecified shoulder: Secondary | ICD-10-CM

## 2014-09-20 DIAGNOSIS — E039 Hypothyroidism, unspecified: Secondary | ICD-10-CM | POA: Insufficient documentation

## 2014-09-20 LAB — POCT CBC
Granulocyte percent: 73 %G (ref 37–80)
HCT, POC: 51 % (ref 43.5–53.7)
Hemoglobin: 16.6 g/dL (ref 14.1–18.1)
Lymph, poc: 1.1 (ref 0.6–3.4)
MCH, POC: 30 pg (ref 27–31.2)
MCHC: 32.6 g/dL (ref 31.8–35.4)
MCV: 91.9 fL (ref 80–97)
MPV: 7.4 fL (ref 0–99.8)
POC Granulocyte: 3.1 (ref 2–6.9)
POC LYMPH PERCENT: 25.2 %L (ref 10–50)
Platelet Count, POC: 110 10*3/uL — AB (ref 142–424)
RBC: 5.6 M/uL (ref 4.69–6.13)
RDW, POC: 13.4 %
WBC: 4.3 10*3/uL — AB (ref 4.6–10.2)

## 2014-09-20 MED ORDER — TRIAMCINOLONE 0.1 % CREAM:EUCERIN CREAM 1:1: 1.0000 "application " | TOPICAL_CREAM | Freq: Two times a day (BID) | CUTANEOUS | Status: DC | PRN

## 2014-09-20 MED ORDER — HYDROCORTISONE 2.5 % RE CREA: 1.0000 "application " | TOPICAL_CREAM | Freq: Two times a day (BID) | RECTAL | Status: DC

## 2014-09-20 MED ORDER — TRIAMCINOLONE ACETONIDE 0.1 % EX CREA: TOPICAL_CREAM | Freq: Two times a day (BID) | CUTANEOUS | Status: DC

## 2014-09-20 NOTE — Progress Notes (Signed)
   Subjective:    Patient ID: BRYNDON CUMBIE, male    DOB: 1932/06/15, 80 y.o.   MRN: [id number]  HPI C/o routine follow up. He has hx of  Hypothyroidism, GERD, depression, arthritis, and hyperlipidemia.    Review of Systems    No chest pain, SOB, HA, dizziness, vision change, N/V, diarrhea, constipation, dysuria, urinary urgency or frequency, myalgias, arthralgias or rash.  Objective:   Physical Exam Vital signs noted  Well developed well nourished male.  HEENT - Head atraumatic Normocephalic Respiratory - Lungs CTA bilateral Cardiac - RRR S1 and S2 without murmur GI - Abdomen soft Nontender and bowel sounds active x 4 Extremities - No edema. Neuro - Grossly intact.       Assessment & Plan:  Rash and nonspecific skin eruption - Plan: Triamcinolone Acetonide (TRIAMCINOLONE 0.1 % CREAM : EUCERIN) CREA   First degree hemorrhoids - Plan: hydrocortisone (ANUSOL-HC) 2.5 % rectal cream  Hyperlipidemia - Plan: POCT CBC, CMP14+EGFR, Lipid panel  Hypothyroidism, unspecified hypothyroidism type - Plan: TSH   Lysbeth Penner FNP

## 2014-09-20 NOTE — Progress Notes (Signed)
Lab only 

## 2014-09-20 NOTE — Telephone Encounter (Signed)
Sent cream to pharmacy

## 2014-09-21 LAB — CMP14+EGFR
ALT: 18 IU/L (ref 0–44)
AST: 23 IU/L (ref 0–40)
Albumin/Globulin Ratio: 1.9 (ref 1.1–2.5)
Albumin: 4.4 g/dL (ref 3.5–4.7)
Alkaline Phosphatase: 59 IU/L (ref 39–117)
BUN/Creatinine Ratio: 16 (ref 10–22)
BUN: 23 mg/dL (ref 8–27)
CO2: 24 mmol/L (ref 18–29)
Calcium: 9.2 mg/dL (ref 8.6–10.2)
Chloride: 100 mmol/L (ref 97–108)
Creatinine, Ser: 1.4 mg/dL — ABNORMAL HIGH (ref 0.76–1.27)
GFR calc Af Amer: 54 mL/min/{1.73_m2} — ABNORMAL LOW (ref >59)
GFR calc non Af Amer: 47 mL/min/{1.73_m2} — ABNORMAL LOW (ref >59)
Globulin, Total: 2.3 g/dL (ref 1.5–4.5)
Glucose: 85 mg/dL (ref 65–99)
Potassium: 4.4 mmol/L (ref 3.5–5.2)
Sodium: 140 mmol/L (ref 134–144)
Total Bilirubin: 0.8 mg/dL (ref 0.0–1.2)
Total Protein: 6.7 g/dL (ref 6.0–8.5)

## 2014-09-21 LAB — LIPID PANEL
Chol/HDL Ratio: 2.3 ratio units (ref 0.0–5.0)
Cholesterol, Total: 153 mg/dL (ref 100–199)
HDL: 68 mg/dL (ref >39)
LDL Calculated: 72 mg/dL (ref 0–99)
Triglycerides: 64 mg/dL (ref 0–149)
VLDL Cholesterol Cal: 13 mg/dL (ref 5–40)

## 2014-09-21 LAB — TSH: TSH: 2.79 u[IU]/mL (ref 0.450–4.500)

## 2014-09-28 NOTE — Progress Notes (Signed)
Pt aware of lab results and to recheck platelets at next visit. Pt will make appt in January.

## 2014-11-23 ENCOUNTER — Other Ambulatory Visit: Payer: Self-pay | Admitting: Family Medicine

## 2014-11-26 NOTE — Telephone Encounter (Signed)
Last seen 03/2014 

## 2014-12-23 ENCOUNTER — Other Ambulatory Visit: Payer: Self-pay | Admitting: Family Medicine

## 2015-01-08 ENCOUNTER — Telehealth: Payer: Self-pay | Admitting: Family Medicine

## 2015-01-08 NOTE — Telephone Encounter (Signed)
Appointment given for Thursday with Oxford.

## 2015-01-10 ENCOUNTER — Encounter: Payer: Self-pay | Admitting: Family Medicine

## 2015-01-10 ENCOUNTER — Ambulatory Visit (INDEPENDENT_AMBULATORY_CARE_PROVIDER_SITE_OTHER): Payer: PRIVATE HEALTH INSURANCE | Admitting: Family Medicine

## 2015-01-10 VITALS — BP 152/71 | HR 73 | Temp 97.4°F | Ht 71.0 in | Wt 189.6 lb

## 2015-01-10 DIAGNOSIS — R21 Rash and other nonspecific skin eruption: Secondary | ICD-10-CM

## 2015-01-10 DIAGNOSIS — L600 Ingrowing nail: Secondary | ICD-10-CM

## 2015-01-10 MED ORDER — TRIAMCINOLONE 0.1 % CREAM:EUCERIN CREAM 1:1: 1.0000 "application " | TOPICAL_CREAM | Freq: Two times a day (BID) | CUTANEOUS | Status: DC | PRN

## 2015-01-10 MED ORDER — DOXYCYCLINE HYCLATE 100 MG PO TABS
100.0000 mg | ORAL_TABLET | Freq: Two times a day (BID) | ORAL | Status: DC
Start: 2015-01-10 — End: 2015-04-24

## 2015-01-10 NOTE — Progress Notes (Signed)
   Subjective:    Patient ID: Michael Mcclure, male    DOB: 10/27/1932, 81 y.o.   MRN: [id number]  HPI Patient is here for follow up toenail removal  Review of Systems  Constitutional: Negative for fever.  HENT: Negative for ear pain.   Eyes: Negative for discharge.  Respiratory: Negative for cough.   Cardiovascular: Negative for chest pain.  Gastrointestinal: Negative for abdominal distention.  Endocrine: Negative for polyuria.  Genitourinary: Negative for difficulty urinating.  Musculoskeletal: Negative for gait problem and neck pain.  Skin: Negative for color change and rash.  Neurological: Negative for speech difficulty and headaches.  Psychiatric/Behavioral: Negative for agitation.       Objective:    BP 152/71 mmHg  Pulse 73  Temp(Src) 97.4 F (36.3 C) (Oral)  Ht 5\' 11"  (1.803 m)  Wt 189 lb 9.6 oz (86.002 kg)  BMI 26.46 kg/m2 Physical Exam  Constitutional: He is oriented to person, place, and time. He appears well-developed and well-nourished.  HENT:  Head: Normocephalic and atraumatic.  Mouth/Throat: Oropharynx is clear and moist.  Eyes: Pupils are equal, round, and reactive to light.  Neck: Normal range of motion. Neck supple.  Cardiovascular: Normal rate and regular rhythm.   No murmur heard. Pulmonary/Chest: Effort normal and breath sounds normal.  Abdominal: Soft. Bowel sounds are normal. There is no tenderness.  Neurological: He is alert and oriented to person, place, and time.  Skin: Skin is warm and dry.  Psychiatric: He has a normal mood and affect.          Assessment & Plan:     ICD-9-CM ICD-10-CM   1. Rash and nonspecific skin eruption 782.1 R21 Triamcinolone Acetonide (TRIAMCINOLONE 0.1 % CREAM : EUCERIN) CREA  2. Ingrown toenail 703.0 L60.0 doxycycline (VIBRA-TABS) 100 MG tablet     No Follow-up on file.  Deatra CanterWilliam J Oxford FNP

## 2015-01-31 ENCOUNTER — Other Ambulatory Visit: Payer: Self-pay | Admitting: Family Medicine

## 2015-04-22 ENCOUNTER — Telehealth: Payer: Self-pay | Admitting: Nurse Practitioner

## 2015-04-22 NOTE — Telephone Encounter (Signed)
Appt scheduled with Lynwood Dawleyiffany Gann, PA-C on 5/18 at 4:00 Patient aware

## 2015-04-24 ENCOUNTER — Ambulatory Visit (INDEPENDENT_AMBULATORY_CARE_PROVIDER_SITE_OTHER): Payer: PRIVATE HEALTH INSURANCE | Admitting: Physician Assistant

## 2015-04-24 ENCOUNTER — Encounter: Payer: Self-pay | Admitting: Physician Assistant

## 2015-04-24 VITALS — BP 142/67 | HR 72 | Temp 97.1°F | Ht 71.0 in | Wt 182.0 lb

## 2015-04-24 DIAGNOSIS — F411 Generalized anxiety disorder: Secondary | ICD-10-CM

## 2015-04-24 DIAGNOSIS — G629 Polyneuropathy, unspecified: Secondary | ICD-10-CM | POA: Diagnosis not present

## 2015-04-24 DIAGNOSIS — L089 Local infection of the skin and subcutaneous tissue, unspecified: Secondary | ICD-10-CM

## 2015-04-24 DIAGNOSIS — M1990 Unspecified osteoarthritis, unspecified site: Secondary | ICD-10-CM

## 2015-04-24 DIAGNOSIS — E038 Other specified hypothyroidism: Secondary | ICD-10-CM

## 2015-04-24 DIAGNOSIS — K219 Gastro-esophageal reflux disease without esophagitis: Secondary | ICD-10-CM | POA: Diagnosis not present

## 2015-04-24 DIAGNOSIS — E785 Hyperlipidemia, unspecified: Secondary | ICD-10-CM | POA: Diagnosis not present

## 2015-04-24 DIAGNOSIS — L309 Dermatitis, unspecified: Secondary | ICD-10-CM | POA: Diagnosis not present

## 2015-04-24 DIAGNOSIS — N400 Enlarged prostate without lower urinary tract symptoms: Secondary | ICD-10-CM | POA: Diagnosis not present

## 2015-04-24 MED ORDER — LEVOTHYROXINE SODIUM 25 MCG PO TABS: 25.0000 ug | ORAL_TABLET | Freq: Every day | ORAL | Status: DC

## 2015-04-24 MED ORDER — CLOPIDOGREL BISULFATE 75 MG PO TABS: 75.0000 mg | ORAL_TABLET | Freq: Every day | ORAL | Status: DC

## 2015-04-24 MED ORDER — GABAPENTIN 600 MG PO TABS: 600.0000 mg | ORAL_TABLET | Freq: Two times a day (BID) | ORAL | Status: DC

## 2015-04-24 MED ORDER — PANTOPRAZOLE SODIUM 40 MG PO TBEC: 40.0000 mg | DELAYED_RELEASE_TABLET | Freq: Every day | ORAL | Status: DC

## 2015-04-24 MED ORDER — TRIAMCINOLONE ACETONIDE 0.1 % EX CREA: 1.0000 "application " | TOPICAL_CREAM | Freq: Two times a day (BID) | CUTANEOUS | Status: DC

## 2015-04-24 MED ORDER — EZETIMIBE-SIMVASTATIN 10-40 MG PO TABS: 1.0000 | ORAL_TABLET | Freq: Every day | ORAL | Status: DC

## 2015-04-24 MED ORDER — CITALOPRAM HYDROBROMIDE 20 MG PO TABS: 20.0000 mg | ORAL_TABLET | Freq: Every day | ORAL | Status: DC

## 2015-04-24 MED ORDER — DICLOFENAC EPOLAMINE 1.3 % TD PTCH: MEDICATED_PATCH | TRANSDERMAL | Status: DC

## 2015-04-24 NOTE — Progress Notes (Signed)
   Subjective:    Patient ID: Michael Mcclure, male    DOB: June 19, 1932, 81 y.o.   MRN: [id number]  HPI 81 y/o male presents with c/o rash on bilateral thighs, come and go x several years. Has used TAC with relief. Uses Dove soap and [employer] brand lotion.   He also needs refills on his medication and recent labs.     Review of Systems  Constitutional: Negative.   HENT: Negative.   Skin: Positive for rash (bilateral thighs ).       Itch, burning lesions on bilateral thighs        Objective:   Physical Exam  Constitutional: He is oriented to person, place, and time. He appears well-developed and well-nourished.  Back brace on due to recent back surgery Ambulates with cane   Neurological: He is alert and oriented to person, place, and time.  Psychiatric: He has a normal mood and affect. His behavior is normal. Judgment and thought content normal.  Nursing note and vitals reviewed.         Assessment & Plan:  1. Anxiety state  - citalopram (CELEXA) 20 MG tablet; Take 1 tablet (20 mg total) by mouth daily.  Dispense: 30 tablet; Refill: 5  2. Arthritis  - gabapentin (NEURONTIN) 600 MG tablet; Take 1 tablet (600 mg total) by mouth 2 (two) times daily.  Dispense: 60 tablet; Refill: 5 - POCT CBC; Future  3. Gastroesophageal reflux disease, esophagitis presence not specified  - pantoprazole (PROTONIX) 40 MG tablet; Take 1 tablet (40 mg total) by mouth daily.  Dispense: 30 tablet; Refill: 5 - CMP14+EGFR; Future  4. Other specified hypothyroidism  - levothyroxine (SYNTHROID, LEVOTHROID) 25 MCG tablet; Take 1 tablet (25 mcg total) by mouth daily.  Dispense: 30 tablet; Refill: 5 - TSH; Future  5. Neuropathy   6. BPH (benign prostatic hypertrophy)  - PSA, total and free; Future  7. Hyperlipidemia  - Lipid panel; Future  8. Skin infection  - Aerobic culture  9. Eczema - TAC.1% BID to AA - Dove soap - Aveeno eczema lotion    Continue all meds Labs  pending Health Maintenance reviewed Diet and exercise encouraged RTO 6 months   Tarik Teixeira A. Benjamin Stain PA-C

## 2015-04-24 NOTE — Patient Instructions (Signed)
Use triamcinolone twice daily for lesions on legs as needed Continue to use Dove soap and Aveeno lotion  Can take Benadryl or Zyrtec 10mg  daily for itch

## 2015-04-25 ENCOUNTER — Other Ambulatory Visit (INDEPENDENT_AMBULATORY_CARE_PROVIDER_SITE_OTHER): Payer: PRIVATE HEALTH INSURANCE

## 2015-04-25 DIAGNOSIS — N400 Enlarged prostate without lower urinary tract symptoms: Secondary | ICD-10-CM | POA: Diagnosis not present

## 2015-04-25 DIAGNOSIS — E038 Other specified hypothyroidism: Secondary | ICD-10-CM

## 2015-04-25 DIAGNOSIS — M1990 Unspecified osteoarthritis, unspecified site: Secondary | ICD-10-CM | POA: Diagnosis not present

## 2015-04-25 DIAGNOSIS — E785 Hyperlipidemia, unspecified: Secondary | ICD-10-CM

## 2015-04-25 DIAGNOSIS — K219 Gastro-esophageal reflux disease without esophagitis: Secondary | ICD-10-CM | POA: Diagnosis not present

## 2015-04-25 LAB — POCT CBC
GRANULOCYTE PERCENT: 59.3 % (ref 37–80)
HCT, POC: 38 % — AB (ref 43.5–53.7)
Hemoglobin: 12.5 g/dL — AB (ref 14.1–18.1)
LYMPH, POC: 1.3 (ref 0.6–3.4)
MCH, POC: 29.1 pg (ref 27–31.2)
MCHC: 32.8 g/dL (ref 31.8–35.4)
MCV: 88.7 fL (ref 80–97)
MPV: 6.6 fL (ref 0–99.8)
POC GRANULOCYTE: 2.5 (ref 2–6.9)
POC LYMPH PERCENT: 31.7 %L (ref 10–50)
Platelet Count, POC: 182 10*3/uL (ref 142–424)
RBC: 4.28 M/uL — AB (ref 4.69–6.13)
RDW, POC: 13.5 %
WBC: 4.2 10*3/uL — AB (ref 4.6–10.2)

## 2015-04-25 NOTE — Progress Notes (Signed)
Lab only 

## 2015-04-26 LAB — PSA, TOTAL AND FREE
PROSTATE SPECIFIC AG, SERUM: 0.7 ng/mL (ref 0.0–4.0)
PSA, Free Pct: 40 %
PSA, Free: 0.28 ng/mL

## 2015-04-26 LAB — CMP14+EGFR
A/G RATIO: 2.1 (ref 1.1–2.5)
ALBUMIN: 4.4 g/dL (ref 3.5–4.7)
ALT: 17 IU/L (ref 0–44)
AST: 25 IU/L (ref 0–40)
Alkaline Phosphatase: 66 IU/L (ref 39–117)
BUN/Creatinine Ratio: 18 (ref 10–22)
BUN: 20 mg/dL (ref 8–27)
Bilirubin Total: 0.6 mg/dL (ref 0.0–1.2)
CALCIUM: 9.6 mg/dL (ref 8.6–10.2)
CO2: 26 mmol/L (ref 18–29)
CREATININE: 1.12 mg/dL (ref 0.76–1.27)
Chloride: 103 mmol/L (ref 97–108)
GFR, EST AFRICAN AMERICAN: 71 mL/min/{1.73_m2} (ref >59)
GFR, EST NON AFRICAN AMERICAN: 62 mL/min/{1.73_m2} (ref >59)
GLOBULIN, TOTAL: 2.1 g/dL (ref 1.5–4.5)
GLUCOSE: 97 mg/dL (ref 65–99)
POTASSIUM: 4.5 mmol/L (ref 3.5–5.2)
Sodium: 141 mmol/L (ref 134–144)
Total Protein: 6.5 g/dL (ref 6.0–8.5)

## 2015-04-26 LAB — LIPID PANEL
CHOLESTEROL TOTAL: 148 mg/dL (ref 100–199)
Chol/HDL Ratio: 2.8 ratio units (ref 0.0–5.0)
HDL: 52 mg/dL (ref >39)
LDL Calculated: 79 mg/dL (ref 0–99)
Triglycerides: 86 mg/dL (ref 0–149)
VLDL Cholesterol Cal: 17 mg/dL (ref 5–40)

## 2015-04-26 LAB — AEROBIC CULTURE

## 2015-04-26 LAB — TSH: TSH: 4.16 u[IU]/mL (ref 0.450–4.500)

## 2015-04-28 ENCOUNTER — Other Ambulatory Visit: Payer: Self-pay | Admitting: Physician Assistant

## 2015-04-28 DIAGNOSIS — D509 Iron deficiency anemia, unspecified: Secondary | ICD-10-CM

## 2015-04-28 MED ORDER — FERROUS SULFATE 325 (65 FE) MG PO TABS: 325.0000 mg | ORAL_TABLET | Freq: Every day | ORAL | Status: DC

## 2015-07-31 ENCOUNTER — Ambulatory Visit (INDEPENDENT_AMBULATORY_CARE_PROVIDER_SITE_OTHER): Payer: PRIVATE HEALTH INSURANCE | Admitting: Physician Assistant

## 2015-07-31 ENCOUNTER — Encounter: Payer: Self-pay | Admitting: Physician Assistant

## 2015-07-31 ENCOUNTER — Ambulatory Visit: Payer: PRIVATE HEALTH INSURANCE | Admitting: Physician Assistant

## 2015-07-31 VITALS — BP 149/76 | HR 70 | Temp 98.1°F | Ht 71.0 in | Wt 182.0 lb

## 2015-07-31 DIAGNOSIS — L570 Actinic keratosis: Secondary | ICD-10-CM

## 2015-08-01 ENCOUNTER — Encounter: Payer: Self-pay | Admitting: Physician Assistant

## 2015-08-01 NOTE — Progress Notes (Signed)
Patient ID: Michael Mcclure, male   DOB: Aug 17, 1932, 81 y.o.   MRN: [id number]   81 y/o male presents for concern of scaling lesions on BUE for several months. He c/o occasional itch and burning of the lesions when he is out in the sun. He has no h/o skin cancer. Has not seen a Dermatologist in the past.   A/P: Hyperkeratotic scaling lesion on BUE indicative of actinic keratosis. I discussed treating the areas with cryosurgery and if lesions recurred we would biopsy the lesions to r/o BCC/SCC. 8 of the hyperkeratotic lesions were treated with cryosurgery on BUE.   F/U if lesions recur after treatment with cryosurgery.   Ruthann Angulo A. Chauncey Reading PA-C

## 2015-09-03 ENCOUNTER — Ambulatory Visit: Payer: PRIVATE HEALTH INSURANCE | Admitting: Physician Assistant

## 2015-09-05 ENCOUNTER — Ambulatory Visit (INDEPENDENT_AMBULATORY_CARE_PROVIDER_SITE_OTHER): Payer: PRIVATE HEALTH INSURANCE | Admitting: *Deleted

## 2015-09-05 DIAGNOSIS — Z23 Encounter for immunization: Secondary | ICD-10-CM | POA: Diagnosis not present

## 2015-09-24 ENCOUNTER — Other Ambulatory Visit: Payer: Self-pay

## 2015-09-24 ENCOUNTER — Ambulatory Visit (INDEPENDENT_AMBULATORY_CARE_PROVIDER_SITE_OTHER): Payer: PRIVATE HEALTH INSURANCE | Admitting: Family Medicine

## 2015-09-24 ENCOUNTER — Encounter: Payer: Self-pay | Admitting: Family Medicine

## 2015-09-24 VITALS — BP 129/67 | HR 78 | Temp 97.9°F | Ht 71.0 in | Wt 186.2 lb

## 2015-09-24 DIAGNOSIS — B354 Tinea corporis: Secondary | ICD-10-CM | POA: Diagnosis not present

## 2015-09-24 DIAGNOSIS — F411 Generalized anxiety disorder: Secondary | ICD-10-CM

## 2015-09-24 DIAGNOSIS — M1990 Unspecified osteoarthritis, unspecified site: Secondary | ICD-10-CM

## 2015-09-24 MED ORDER — OXICONAZOLE NITRATE 1 % EX CREA: TOPICAL_CREAM | CUTANEOUS | Status: DC

## 2015-09-24 MED ORDER — CELECOXIB 200 MG PO CAPS: 200.0000 mg | ORAL_CAPSULE | Freq: Two times a day (BID) | ORAL | Status: DC

## 2015-09-24 NOTE — Progress Notes (Signed)
   HPI  Patient presents today reviewed she had rash.  Patient states he's had a chronic intermittent red rash just under his underwear line on his lower abdomen. He states it's been there for about one year. He denies any weeping or pain from the lesion. It so itchy that Advil bothers him and is not able to wear that. He has tried neosporin without imporvement  He denies fever, chills, sweats  PMH: Smoking status noted ROS: Per HPI  Objective: BP 129/67 mmHg  Pulse 78  Temp(Src) 97.9 F (36.6 C) (Oral)  Ht 5\' 11"  (1.803 m)  Wt 186 lb 3.2 oz (84.46 kg)  BMI 25.98 kg/m2 Gen: NAD, alert, cooperative with exam HEENT: NCAT CV: RRR, good S1/S2, no murmur Resp: CTABL, no wheezes, non-labored Ext: No edema, warm Neuro: Alert and oriented, No gross deficits Skin: For distinct lesions, the one largest is just left of midline just under his underwear line measures approximately 3 cm x 4 cm, to the right at 1 cm roughly circular, to the left further there are 2 smaller lesions each about 2 cm in diameter, they're slightly erythematous, slightly scaly no papules, fluctuance, induration, or weeping Not directly related to button back of jeans  Assessment and plan:  # Tinea corporis Treat with oxiconazole Follow-up in 2 weeks if not improved, consider oral Lamisil versus topical steroids if not improved.   Meds ordered this encounter  Medications  . oxiconazole (OXISTAT) 1 % CREA    Sig: Apply to affected area twice daily    Dispense:  60 each    Refill:  0    Murtis SinkSam Bradshaw, MD [facility] 09/24/2015, 11:17 AM

## 2015-09-24 NOTE — Patient Instructions (Signed)
Great to meet you!  Come back in 2 weeks if this is not improving and we can try another treatment.    Body Ringworm Ringworm (tinea corporis) is a fungal infection of the skin on the body. This infection is not caused by worms, but is actually caused by a fungus. Fungus normally lives on the top of your skin and can be useful. However, in the case of ringworms, the fungus grows out of control and causes a skin infection. It can involve any area of skin on the body and can spread easily from one person to another (contagious). Ringworm is a common problem for children, but it can affect adults as well. Ringworm is also often found in athletes, especially wrestlers who share equipment and mats.  CAUSES  Ringworm of the body is caused by a fungus called dermatophyte. It can spread by:  Touchingother people who are infected.  Touchinginfected pets.  Touching or sharingobjects that have been in contact with the infected person or pet (hats, combs, towels, clothing, sports equipment). SYMPTOMS   Itchy, raised red spots and bumps on the skin.  Ring-shaped rash.  Redness near the border of the rash with a clear center.  Dry and scaly skin on or around the rash. Not every person develops a ring-shaped rash. Some develop only the red, scaly patches. DIAGNOSIS  Most often, ringworm can be diagnosed by performing a skin exam. Your caregiver may choose to take a skin scraping from the affected area. The sample will be examined under the microscope to see if the fungus is present.  TREATMENT  Body ringworm may be treated with a topical antifungal cream or ointment. Sometimes, an antifungal shampoo that can be used on your body is prescribed. You may be prescribed antifungal medicines to take by mouth if your ringworm is severe, keeps coming back, or lasts a long time.  HOME CARE INSTRUCTIONS   Only take over-the-counter or prescription medicines as directed by your caregiver.  Wash the  infected area and dry it completely before applying yourcream or ointment.  When using antifungal shampoo to treat the ringworm, leave the shampoo on the body for 3-5 minutes before rinsing.   Wear loose clothing to stop clothes from rubbing and irritating the rash.  Wash or change your bed sheets every night while you have the rash.  Have your pet treated by your veterinarian if it has the same infection. To prevent ringworm:   Practice good hygiene.  Wear sandals or shoes in public places and showers.  Do not share personal items with others.  Avoid touching red patches of skin on other people.  Avoid touching pets that have bald spots or wash your hands after doing so. SEEK MEDICAL CARE IF:   Your rash continues to spread after 7 days of treatment.  Your rash is not gone in 4 weeks.  The area around your rash becomes red, warm, tender, and swollen.   This information is not intended to replace advice given to you by your health care provider. Make sure you discuss any questions you have with your health care provider.   Document Released: 11/20/2000 Document Revised: 08/17/2012 Document Reviewed: 06/06/2012 Elsevier Interactive Patient Education [employer] Elsevier Inc.

## 2015-10-03 ENCOUNTER — Other Ambulatory Visit: Payer: Self-pay

## 2015-10-03 MED ORDER — OXICONAZOLE NITRATE 1 % EX CREA: TOPICAL_CREAM | CUTANEOUS | Status: DC

## 2015-10-10 ENCOUNTER — Encounter: Payer: Self-pay | Admitting: Family Medicine

## 2015-10-10 DIAGNOSIS — H903 Sensorineural hearing loss, bilateral: Secondary | ICD-10-CM | POA: Insufficient documentation

## 2015-10-19 ENCOUNTER — Other Ambulatory Visit: Payer: Self-pay | Admitting: Physician Assistant

## 2015-11-07 ENCOUNTER — Other Ambulatory Visit: Payer: Self-pay | Admitting: Family Medicine

## 2015-11-07 ENCOUNTER — Telehealth: Payer: Self-pay

## 2015-11-07 DIAGNOSIS — K640 First degree hemorrhoids: Secondary | ICD-10-CM

## 2015-11-07 MED ORDER — OXICONAZOLE NITRATE 1 % EX CREA: TOPICAL_CREAM | CUTANEOUS | Status: DC

## 2015-11-07 MED ORDER — HYDROCORTISONE 2.5 % RE CREA: 1.0000 "application " | TOPICAL_CREAM | Freq: Two times a day (BID) | RECTAL | Status: DC

## 2015-11-07 NOTE — Telephone Encounter (Signed)
Left message rx sent to pharmacy

## 2015-11-07 NOTE — Telephone Encounter (Signed)
Anusol refilled

## 2015-11-07 NOTE — Telephone Encounter (Signed)
Refilled antifungal cream  Murtis SinkSam Bradshaw, MD [facility] 11/07/2015, 4:48 PM

## 2015-11-19 ENCOUNTER — Other Ambulatory Visit: Payer: Self-pay | Admitting: *Deleted

## 2015-11-19 MED ORDER — EZETIMIBE-SIMVASTATIN 10-40 MG PO TABS: 1.0000 | ORAL_TABLET | Freq: Every day | ORAL | Status: DC

## 2015-11-26 ENCOUNTER — Encounter: Payer: Self-pay | Admitting: Family Medicine

## 2015-11-26 ENCOUNTER — Ambulatory Visit (INDEPENDENT_AMBULATORY_CARE_PROVIDER_SITE_OTHER): Payer: PRIVATE HEALTH INSURANCE | Admitting: Family Medicine

## 2015-11-26 VITALS — BP 104/48 | HR 83 | Temp 97.0°F | Ht 71.0 in | Wt 185.0 lb

## 2015-11-26 DIAGNOSIS — Z23 Encounter for immunization: Secondary | ICD-10-CM

## 2015-11-26 DIAGNOSIS — R350 Frequency of micturition: Secondary | ICD-10-CM | POA: Diagnosis not present

## 2015-11-26 DIAGNOSIS — N400 Enlarged prostate without lower urinary tract symptoms: Secondary | ICD-10-CM

## 2015-11-26 DIAGNOSIS — Z0000 Encounter for general adult medical examination without abnormal findings: Secondary | ICD-10-CM | POA: Diagnosis not present

## 2015-11-26 LAB — POCT URINALYSIS DIPSTICK
BILIRUBIN UA: NEGATIVE
Blood, UA: NEGATIVE
GLUCOSE UA: NEGATIVE
Ketones, UA: NEGATIVE
LEUKOCYTES UA: NEGATIVE
NITRITE UA: NEGATIVE
PH UA: 6
Protein, UA: NEGATIVE
Spec Grav, UA: 1.015
Urobilinogen, UA: NEGATIVE

## 2015-11-26 LAB — POCT UA - MICROSCOPIC ONLY
BACTERIA, U MICROSCOPIC: NEGATIVE
CRYSTALS, UR, HPF, POC: NEGATIVE
Casts, Ur, LPF, POC: NEGATIVE
Epithelial cells, urine per micros: NEGATIVE
RBC, URINE, MICROSCOPIC: NEGATIVE
WBC, Ur, HPF, POC: NEGATIVE
Yeast, UA: NEGATIVE

## 2015-11-26 MED ORDER — FINASTERIDE 5 MG PO TABS: 5.0000 mg | ORAL_TABLET | Freq: Every day | ORAL | Status: DC

## 2015-11-26 NOTE — Progress Notes (Signed)
   HPI  Patient presents today  Here for annual physical and discussed polyuria.  Polyuria He complains of 3 months or so of nocturia, weak stream, straining with urination sometimes, and dribbling Denies pain, dysuria No fever, chills, sweats Last PSA normal at 0.7  Dizziness, has 1 day Hx of dizziness, states he felt unsteady at [employer] this morning and nearly passed out., now he is feeling fine.   He takes plavix for carotid stenosis and has recently stopped asa  Denies chest pain, Hx of CVA or MI Denies dyspnea, palpitations, leg edema Denies current dizziness  PMH: Smoking status noted ROS: Per HPI  Objective: BP 104/48 mmHg  Pulse 83  Temp(Src) 97 F (36.1 C) (Oral)  Ht 5\' 11"  (1.803 m)  Wt 185 lb (83.915 kg)  BMI 25.81 kg/m2 Gen: NAD, alert, cooperative with exam HEENT: NCAT CV: RRR, good S1/S2, no murmur Resp: CTABL, no wheezes, non-labored Abd: SNTND, BS present, no guarding or organomegaly Ext: No edema, warm Neuro: Alert and oriented, No gross deficits Rectal: Large non tender prostate with no nodules, smooth and normal consistency  Assessment and plan:  # BPH Repeat PSA, last was good but he has had a lot of changes in teh last 3 months Cannot start flomax today due to dizziness and lower BP than usuall, discussed that it is the only med that wold help quickly and offered urology appt which he declines Start proscar, he understand several months for results F/u 1 week if BP back to normal and no additional symptoms would recommend starting flomax, if not could consider urology again (wants [facility] if referred) Discussed red flags for emergency care, namely urinary retention which he understands  # Annual physical Labs from may this year, only checking PSA Zostavax if insurance coverage is good, billing addressing    Orders Placed This Encounter  Procedures  . PSA  . POCT urinalysis dipstick  . POCT UA - Microscopic Only    Meds ordered this  encounter  Medications  . Cholecalciferol (VITAMIN D) 2000 UNITS CAPS    Sig: Take by mouth.  . Flaxseed, Linseed, (FLAX SEED OIL) 1000 MG CAPS    Sig: Take by mouth.  . finasteride (PROSCAR) 5 MG tablet    Sig: Take 1 tablet (5 mg total) by mouth daily.    Dispense:  90 tablet    Refill:  3    Murtis SinkSam Emaya Preston, MD [facility] 11/26/2015, 10:48 AM

## 2015-11-26 NOTE — Patient Instructions (Addendum)
Great to see you!  Your urine symptoms are from BPH, enlarged protstate, but The medicine will make your blood pressure lower.   Lets get you back next week to check your blood pressure and start a medicine that will work faster.   I have started a medicine called proscar, this will make a big difference with your prostate but it will take a few months to work its best.   Follow up 1 week Start proscar    Benign Prostatic Hyperplasia An enlarged prostate (benign prostatic hyperplasia) is common in older men. You may experience the following:  Weak urine stream.  Dribbling.  Feeling like the bladder has not emptied completely.  Difficulty starting urination.  Getting up frequently at night to urinate.  Urinating more frequently during the day. HOME CARE INSTRUCTIONS  Monitor your prostatic hyperplasia for any changes. The following actions may help to alleviate any discomfort you are experiencing:  Give yourself time when you urinate.  Stay away from alcohol.  Avoid beverages containing caffeine, such as coffee, tea, and colas, because they can make the problem worse.  Avoid decongestants, antihistamines, and some prescription medicines that can make the problem worse.  Follow up with your health care provider for further treatment as recommended. SEEK MEDICAL CARE IF:  You are experiencing progressive difficulty voiding.  Your urine stream is progressively getting narrower.  You are awaking from sleep with the urge to void more frequently.  You are constantly feeling the need to void.  You experience loss of urine, especially in small amounts. SEEK IMMEDIATE MEDICAL CARE IF:   You develop increased pain with urination or are unable to urinate.  You develop severe abdominal pain, vomiting, a high fever, or fainting.  You develop back pain or blood in your urine. MAKE SURE YOU:   Understand these instructions.  Will watch your condition.  Will get help  right away if you are not doing well or get worse.   This information is not intended to replace advice given to you by your health care provider. Make sure you discuss any questions you have with your health care provider.   Document Released: 11/23/2005 Document Revised: 12/14/2014 Document Reviewed: 04/25/2013 Elsevier Interactive Patient Education [employer] Elsevier Inc.

## 2015-11-26 NOTE — Addendum Note (Signed)
Addended by: Lorelee CoverOSTOSKY, JESSICA C on: 11/26/2015 11:42 AM   Modules accepted: Orders

## 2015-11-27 LAB — PSA: Prostate Specific Ag, Serum: 0.7 ng/mL (ref 0.0–4.0)

## 2015-11-29 ENCOUNTER — Telehealth: Payer: Self-pay | Admitting: Family Medicine

## 2015-11-29 NOTE — Telephone Encounter (Signed)
Reviewed labs with pt. 

## 2015-12-04 ENCOUNTER — Ambulatory Visit (INDEPENDENT_AMBULATORY_CARE_PROVIDER_SITE_OTHER): Payer: PRIVATE HEALTH INSURANCE | Admitting: Family Medicine

## 2015-12-04 ENCOUNTER — Encounter: Payer: Self-pay | Admitting: Family Medicine

## 2015-12-04 VITALS — BP 140/67 | Temp 97.6°F | Ht 71.0 in | Wt 184.0 lb

## 2015-12-04 DIAGNOSIS — R42 Dizziness and giddiness: Secondary | ICD-10-CM | POA: Diagnosis not present

## 2015-12-04 DIAGNOSIS — N400 Enlarged prostate without lower urinary tract symptoms: Secondary | ICD-10-CM

## 2015-12-04 MED ORDER — TAMSULOSIN HCL 0.4 MG PO CAPS: 0.4000 mg | ORAL_CAPSULE | Freq: Every day | ORAL | Status: DC

## 2015-12-04 NOTE — Progress Notes (Signed)
BP 140/67 mmHg  Temp(Src) 97.6 F (36.4 C) (Oral)  Ht  (1.803 m)  Wt 184 lb (83.462 kg)  BMI 25.67 kg/m2   Subjective:    Patient ID: Michael Mcclure, male    DOB: August 02, 1932, 81 y.o.   MRN: [id number]  HPI: Michael Mcclure is a 81 y.o. male presenting on 12/04/2015 for Blood Pressure Check and Prostate Check   HPI Dizziness Prior to last visit patient says he had one episode of weakness and dizziness, he says he's had these before but it's been quite a few years since he's had one. He described it more as a feeling of presyncope but did not actually have a syncopal episode. It past about 5-10 minutes and has not returned since. He denies any chest pain, shortness of breath, blurred vision or continued weakness.  Nocturia He is still been having issues with waking up 3 times a night at least to urinate. He was started on Proscar but there was some concern about the Flomax because of his dizziness. He denies any further dizziness and would like to go ahead and try the Flomax. He also admits to having some minor stream issues with starting his stream. He denies any dysuria or hematuria. Over the past couple weeks he has cut out all of his caffeine except one cup of coffee in the morning but he did switch to decaf.  Relevant past medical, surgical, family and social history reviewed and updated as indicated. Interim medical history since our last visit reviewed. Allergies and medications reviewed and updated.  Review of Systems  Constitutional: Negative for fever.  HENT: Negative for ear discharge and ear pain.   Eyes: Negative for discharge and visual disturbance.  Respiratory: Negative for shortness of breath and wheezing.   Cardiovascular: Negative for chest pain and leg swelling.  Gastrointestinal: Negative for abdominal pain, diarrhea and constipation.  Endocrine: Negative for polydipsia and polyuria.  Genitourinary: Positive for frequency and difficulty urinating  (Sometimes starting his stream). Negative for dysuria, urgency, hematuria, flank pain, decreased urine volume, scrotal swelling, penile pain and testicular pain.  Musculoskeletal: Negative for back pain and gait problem.  Skin: Negative for rash.  Neurological: Negative for dizziness (Denies any further), syncope, light-headedness and headaches.  All other systems reviewed and are negative.   Per HPI unless specifically indicated above     Medication List       This list is accurate as of: 12/04/15  9:08 AM.  Always use your most recent med list.               celecoxib 200 MG capsule  Commonly known as:  CELEBREX  Take 1 capsule (200 mg total) by mouth 2 (two) times daily.     citalopram 20 MG tablet  Commonly known as:  CELEXA  TAKE 1 TABLET (20 MG TOTAL) BY MOUTH DAILY.     clopidogrel 75 MG tablet  Commonly known as:  PLAVIX  TAKE 1 TABLET (75 MG TOTAL) BY MOUTH DAILY.     diclofenac 1.3 % Ptch  Commonly known as:  FLECTOR  PLACE 1 PATCH ONTO THE SKIN 2 (TWO) TIMES DAILY.     ezetimibe-simvastatin 10-40 MG tablet  Commonly known as:  VYTORIN  Take 1 tablet by mouth at bedtime.     finasteride 5 MG tablet  Commonly known as:  PROSCAR  Take 1 tablet (5 mg total) by mouth daily.     Flax Seed Oil 1000 MG Caps  Take by mouth.     gabapentin 600 MG tablet  Commonly known as:  NEURONTIN  Take 1 tablet (600 mg total) by mouth 2 (two) times daily.     levothyroxine 25 MCG tablet  Commonly known as:  SYNTHROID, LEVOTHROID  TAKE 1 TABLET (25 MCG TOTAL) BY MOUTH DAILY.     oxiconazole 1 % Crea  Commonly known as:  OXISTAT  Apply to affected area twice daily     pantoprazole 40 MG tablet  Commonly known as:  PROTONIX  TAKE 1 TABLET (40 MG TOTAL) BY MOUTH DAILY.     tamsulosin 0.4 MG Caps capsule  Commonly known as:  FLOMAX  Take 1 capsule (0.4 mg total) by mouth daily.     triamcinolone 0.1 % cream : eucerin Crea  Apply 1 application topically 2 (two) times  daily as needed.     triamcinolone cream 0.1 %  Commonly known as:  KENALOG  Apply 1 application topically 2 (two) times daily.     Vitamin D 2000 units Caps  Take by mouth.           Objective:    BP 140/67 mmHg  Temp(Src) 97.6 F (36.4 C) (Oral)  Ht  (1.803 m)  Wt 184 lb (83.462 kg)  BMI 25.67 kg/m2  Wt Readings from Last 3 Encounters:  12/04/15 184 lb (83.462 kg)  11/26/15 185 lb (83.915 kg)  09/24/15 186 lb 3.2 oz (84.46 kg)    Physical Exam  Constitutional: He is oriented to person, place, and time. He appears well-developed and well-nourished. No distress.  Eyes: Conjunctivae and EOM are normal. Pupils are equal, round, and reactive to light. Right eye exhibits no discharge. No scleral icterus.  Cardiovascular: Normal rate, regular rhythm, normal heart sounds and intact distal pulses.   No murmur heard. Pulmonary/Chest: Effort normal and breath sounds normal. No respiratory distress. He has no wheezes.  Abdominal: He exhibits no distension. There is no tenderness. There is no rebound.  Musculoskeletal: Normal range of motion. He exhibits no edema.  Neurological: He is alert and oriented to person, place, and time. He displays normal reflexes. No cranial nerve deficit. He exhibits normal muscle tone. Coordination normal.  Skin: Skin is warm and dry. No rash noted. He is not diaphoretic.  Psychiatric: He has a normal mood and affect. His behavior is normal.  Vitals reviewed.   Results for orders placed or performed in visit on 11/26/15  PSA  Result Value Ref Range   Prostate Specific Ag, Serum 0.7 0.0 - 4.0 ng/mL  POCT urinalysis dipstick  Result Value Ref Range   Color, UA gold    Clarity, UA clear    Glucose, UA negative    Bilirubin, UA negative    Ketones, UA negative    Spec Grav, UA 1.015    Blood, UA negative    pH, UA 6.0    Protein, UA negative    Urobilinogen, UA negative    Nitrite, UA negative    Leukocytes, UA Negative Negative  POCT UA  - Microscopic Only  Result Value Ref Range   WBC, Ur, HPF, POC negative    RBC, urine, microscopic negative    Bacteria, U Microscopic negative    Mucus, UA occ    Epithelial cells, urine per micros negative    Crystals, Ur, HPF, POC negative    Casts, Ur, LPF, POC negative    Yeast, UA negative       Assessment & Plan:  Problem List Items Addressed This Visit      Genitourinary   BPH (benign prostatic hypertrophy) - Primary    Warned of possible side effects, we'll start Flomax and it has dizziness he will stop and let us know and we will likely do a referral to urology at that point.      Relevant Medications   tamsulosin (FLOMAX) 0.4 MG CAPS capsule    Other Visit Diagnoses    Dizziness        We'll monitor for now, if recurs patient will return        Follow up plan: Return in about 4 weeks (around 01/01/2016), or if symptoms worsen or fail to improve, for Recheck on Flomax for prostate.  Counseling provided for all of the vaccine components No orders of the defined types were placed in this encounter.    Arville CareJoshua Namrata Dangler, MD [facility] 12/04/2015, 9:08 AM

## 2015-12-04 NOTE — Assessment & Plan Note (Signed)
Warned of possible side effects, we'll start Flomax and it has dizziness he will stop and let us know and we will likely do a referral to urology at that point.

## 2015-12-08 DIAGNOSIS — H16139 Photokeratitis, unspecified eye: Secondary | ICD-10-CM

## 2015-12-08 DIAGNOSIS — L821 Other seborrheic keratosis: Secondary | ICD-10-CM

## 2015-12-08 HISTORY — DX: Photokeratitis, unspecified eye: H16.139

## 2015-12-08 HISTORY — DX: Other seborrheic keratosis: L82.1

## 2015-12-11 ENCOUNTER — Ambulatory Visit: Payer: PRIVATE HEALTH INSURANCE | Admitting: Family Medicine

## 2015-12-17 ENCOUNTER — Other Ambulatory Visit: Payer: Self-pay

## 2015-12-17 MED ORDER — CITALOPRAM HYDROBROMIDE 20 MG PO TABS: ORAL_TABLET | ORAL | Status: DC

## 2015-12-17 MED ORDER — EZETIMIBE-SIMVASTATIN 10-40 MG PO TABS: 1.0000 | ORAL_TABLET | Freq: Every day | ORAL | Status: DC

## 2016-01-12 ENCOUNTER — Other Ambulatory Visit: Payer: Self-pay | Admitting: Family Medicine

## 2016-01-13 NOTE — Telephone Encounter (Signed)
Last seen 12/04/15  Dr Dettinger  Last lipid  04/21/15

## 2016-01-13 NOTE — Telephone Encounter (Signed)
Please forward to Dr. Ermalinda Memos as he is the patient's primary and I have only seen them for an acute visit.

## 2016-01-14 ENCOUNTER — Other Ambulatory Visit: Payer: Self-pay

## 2016-01-14 DIAGNOSIS — M1990 Unspecified osteoarthritis, unspecified site: Secondary | ICD-10-CM

## 2016-01-14 MED ORDER — CELECOXIB 200 MG PO CAPS: 200.0000 mg | ORAL_CAPSULE | Freq: Two times a day (BID) | ORAL | Status: DC

## 2016-01-15 ENCOUNTER — Other Ambulatory Visit: Payer: Self-pay | Admitting: *Deleted

## 2016-01-15 DIAGNOSIS — M1990 Unspecified osteoarthritis, unspecified site: Secondary | ICD-10-CM

## 2016-01-15 MED ORDER — CELECOXIB 200 MG PO CAPS: 200.0000 mg | ORAL_CAPSULE | Freq: Two times a day (BID) | ORAL | Status: DC

## 2016-01-15 MED ORDER — EZETIMIBE-SIMVASTATIN 10-40 MG PO TABS: 1.0000 | ORAL_TABLET | Freq: Every day | ORAL | Status: DC

## 2016-01-15 MED ORDER — CITALOPRAM HYDROBROMIDE 20 MG PO TABS: ORAL_TABLET | ORAL | Status: DC

## 2016-01-15 MED ORDER — LEVOTHYROXINE SODIUM 25 MCG PO TABS: ORAL_TABLET | ORAL | Status: DC

## 2016-01-15 MED ORDER — CLOPIDOGREL BISULFATE 75 MG PO TABS: ORAL_TABLET | ORAL | Status: DC

## 2016-01-15 MED ORDER — PANTOPRAZOLE SODIUM 40 MG PO TBEC: DELAYED_RELEASE_TABLET | ORAL | Status: DC

## 2016-02-07 ENCOUNTER — Encounter: Payer: Self-pay | Admitting: Family Medicine

## 2016-02-07 ENCOUNTER — Ambulatory Visit (INDEPENDENT_AMBULATORY_CARE_PROVIDER_SITE_OTHER): Payer: PRIVATE HEALTH INSURANCE | Admitting: Family Medicine

## 2016-02-07 VITALS — BP 121/66 | HR 77 | Temp 96.9°F | Ht 71.0 in | Wt 186.4 lb

## 2016-02-07 DIAGNOSIS — M1990 Unspecified osteoarthritis, unspecified site: Secondary | ICD-10-CM | POA: Diagnosis not present

## 2016-02-07 DIAGNOSIS — M5489 Other dorsalgia: Secondary | ICD-10-CM | POA: Diagnosis not present

## 2016-02-07 DIAGNOSIS — R42 Dizziness and giddiness: Secondary | ICD-10-CM | POA: Diagnosis not present

## 2016-02-07 DIAGNOSIS — G629 Polyneuropathy, unspecified: Secondary | ICD-10-CM

## 2016-02-07 DIAGNOSIS — N400 Enlarged prostate without lower urinary tract symptoms: Secondary | ICD-10-CM

## 2016-02-07 MED ORDER — TAMSULOSIN HCL 0.4 MG PO CAPS: 0.4000 mg | ORAL_CAPSULE | Freq: Every day | ORAL | Status: DC

## 2016-02-07 MED ORDER — GABAPENTIN 800 MG PO TABS: 800.0000 mg | ORAL_TABLET | Freq: Two times a day (BID) | ORAL | Status: DC

## 2016-02-07 NOTE — Patient Instructions (Signed)
Great to see you!  Come back in 2-3 months for routine follow up  Take flomax and finasteride everyday. Finasteride will work after several months of use but is very effective and will not cause dizziness  I have increased your gabapentin

## 2016-02-07 NOTE — Progress Notes (Signed)
   HPI  Patient presents today here for follow-up of BPH, neuropathy, and dizziness.  Dizziness Has improved, he feels "swimmy headed" whenever it happens  He states that it comes on a few times a week and lasts maybe 30 minutes to an hour at a time. Denies room spinning sensation Denies one-sided weakness or numbness He is on Plavix for what he describes as vertebral artery stenosis Denies dysarthria  Flomax, BPH Doing well with Flomax, he's been out for 1 night and had 5 episodes of nocturia last night He has been taking Proscar intermittently, he did not understand the long-term nature of it.  Neuropathy Right-sided back pain with right-sided leg symptoms, he sees neurosurgeon, Dr. Channing Muttersoy, he is advised him to increase his gabapentin to 800 mg twice daily  PMH: Smoking status noted ROS: Per HPI  Objective: BP 121/66 mmHg  Pulse 77  Temp(Src) 96.9 F (36.1 C) (Oral)  Ht 5\' 11"  (1.803 m)  Wt 186 lb 6.4 oz (84.55 kg)  BMI 26.01 kg/m2 Gen: NAD, alert, cooperative with exam HEENT: NCAT, EOMI, PERRL CV: RRR, good S1/S2, no murmur Resp: CTABL, no wheezes, non-labored Ext: No edema, warm Neuro: Alert and oriented, normal gait, normal speech, symmetric face Cranial nerves II through XII intact Strength 5/5 and sensation intact in all 4 extremities  Assessment and plan:  # Dizziness Unclear etiology, however I initially felt it was due to soft blood pressure which is still possibility given its temporal nature Pressure did today No signs of CVA, we have discussed red flags in detail to seek emergency medical care Continue to monitor  # BPH Improved on Flomax Refilled Continue Proscar, discussed long-term advantages of Proscar given his dizziness  # Neuropathy, back pain Increase gabapentin per his request, not causing any sedation    Meds ordered this encounter  Medications  . gabapentin (NEURONTIN) 800 MG tablet    Sig: Take 1 tablet (800 mg total) by mouth 2 (two)  times daily.    Dispense:  180 tablet    Refill:  3  . tamsulosin (FLOMAX) 0.4 MG CAPS capsule    Sig: Take 1 capsule (0.4 mg total) by mouth daily.    Dispense:  90 capsule    Refill:  3    Michael SinkSam Bradshaw, MD [facility] 02/07/2016, 8:49 AM

## 2016-02-13 ENCOUNTER — Other Ambulatory Visit: Payer: Self-pay | Admitting: Family Medicine

## 2016-02-13 DIAGNOSIS — M1990 Unspecified osteoarthritis, unspecified site: Secondary | ICD-10-CM

## 2016-02-13 MED ORDER — CLOPIDOGREL BISULFATE 75 MG PO TABS: ORAL_TABLET | ORAL | Status: DC

## 2016-02-13 MED ORDER — LEVOTHYROXINE SODIUM 25 MCG PO TABS: ORAL_TABLET | ORAL | Status: DC

## 2016-02-13 MED ORDER — EZETIMIBE-SIMVASTATIN 10-40 MG PO TABS: 1.0000 | ORAL_TABLET | Freq: Every day | ORAL | Status: DC

## 2016-02-13 MED ORDER — CITALOPRAM HYDROBROMIDE 20 MG PO TABS: ORAL_TABLET | ORAL | Status: DC

## 2016-02-13 MED ORDER — CELECOXIB 200 MG PO CAPS: 200.0000 mg | ORAL_CAPSULE | Freq: Two times a day (BID) | ORAL | Status: DC

## 2016-02-13 MED ORDER — PANTOPRAZOLE SODIUM 40 MG PO TBEC: DELAYED_RELEASE_TABLET | ORAL | Status: DC

## 2016-04-08 ENCOUNTER — Other Ambulatory Visit: Payer: Self-pay | Admitting: *Deleted

## 2016-04-08 ENCOUNTER — Other Ambulatory Visit: Payer: Self-pay | Admitting: Family Medicine

## 2016-04-08 NOTE — Telephone Encounter (Signed)
Last labs 04/2015

## 2016-04-08 NOTE — Telephone Encounter (Signed)
Last labs 04/22/2015

## 2016-05-11 ENCOUNTER — Encounter: Payer: Self-pay | Admitting: Family Medicine

## 2016-05-11 ENCOUNTER — Ambulatory Visit (INDEPENDENT_AMBULATORY_CARE_PROVIDER_SITE_OTHER): Payer: PRIVATE HEALTH INSURANCE | Admitting: Family Medicine

## 2016-05-11 VITALS — BP 137/70 | HR 66 | Temp 97.0°F | Ht 71.0 in | Wt 187.0 lb

## 2016-05-11 DIAGNOSIS — E785 Hyperlipidemia, unspecified: Secondary | ICD-10-CM

## 2016-05-11 DIAGNOSIS — I872 Venous insufficiency (chronic) (peripheral): Secondary | ICD-10-CM | POA: Insufficient documentation

## 2016-05-11 DIAGNOSIS — G629 Polyneuropathy, unspecified: Secondary | ICD-10-CM | POA: Diagnosis not present

## 2016-05-11 DIAGNOSIS — M1990 Unspecified osteoarthritis, unspecified site: Secondary | ICD-10-CM

## 2016-05-11 DIAGNOSIS — E039 Hypothyroidism, unspecified: Secondary | ICD-10-CM

## 2016-05-11 DIAGNOSIS — N400 Enlarged prostate without lower urinary tract symptoms: Secondary | ICD-10-CM | POA: Diagnosis not present

## 2016-05-11 MED ORDER — LEVOTHYROXINE SODIUM 25 MCG PO TABS: ORAL_TABLET | ORAL | Status: DC

## 2016-05-11 MED ORDER — CITALOPRAM HYDROBROMIDE 20 MG PO TABS: ORAL_TABLET | ORAL | Status: DC

## 2016-05-11 MED ORDER — CLOPIDOGREL BISULFATE 75 MG PO TABS: ORAL_TABLET | ORAL | Status: DC

## 2016-05-11 MED ORDER — GABAPENTIN 800 MG PO TABS: 800.0000 mg | ORAL_TABLET | Freq: Two times a day (BID) | ORAL | Status: DC

## 2016-05-11 MED ORDER — PANTOPRAZOLE SODIUM 40 MG PO TBEC: DELAYED_RELEASE_TABLET | ORAL | Status: DC

## 2016-05-11 MED ORDER — GABAPENTIN 800 MG PO TABS: 800.0000 mg | ORAL_TABLET | Freq: Three times a day (TID) | ORAL | Status: DC

## 2016-05-11 MED ORDER — EZETIMIBE-SIMVASTATIN 10-40 MG PO TABS: 1.0000 | ORAL_TABLET | Freq: Every day | ORAL | Status: DC

## 2016-05-11 NOTE — Patient Instructions (Signed)
Your procedure is scheduled on: 05/18/2016  Report to [facility] at   730  AM.  Call this number if you have problems the morning of surgery: [phone]   Do not eat food or drink liquids :After Midnight.      Take these medicines the morning of surgery with A SIP OF WATER: celebrex, celexa, proscar, neurontin, levothyroxine, protonix, flomax.   Do not wear jewelry, make-up or nail polish.  Do not wear lotions, powders, or perfumes. You may wear deodorant.  Do not shave 48 hours prior to surgery.  Do not bring valuables to the hospital.  Contacts, dentures or bridgework may not be worn into surgery.  Leave suitcase in the car. After surgery it may be brought to your room.  For patients admitted to the hospital, checkout time is 11:00 AM the day of discharge.   Patients discharged the day of surgery will not be allowed to drive home.  :     Please read over the following fact sheets that you were given: Coughing and Deep Breathing, Surgical Site Infection Prevention, Anesthesia Post-op Instructions and Care and Recovery After Surgery    Cataract A cataract is a clouding of the lens of the eye. When a lens becomes cloudy, vision is reduced based on the degree and nature of the clouding. Many cataracts reduce vision to some degree. Some cataracts make people more near-sighted as they develop. Other cataracts increase glare. Cataracts that are ignored and become worse can sometimes look white. The white color can be seen through the pupil. CAUSES   Aging. However, cataracts may occur at any age, even in newborns.   Certain drugs.   Trauma to the eye.   Certain diseases such as diabetes.   Specific eye diseases such as chronic inflammation inside the eye or a sudden attack of a rare form of glaucoma.   Inherited or acquired medical problems.  SYMPTOMS   Gradual, progressive drop in vision in the affected eye.   Severe, rapid visual loss. This most often happens when trauma is the  cause.  DIAGNOSIS  To detect a cataract, an eye doctor examines the lens. Cataracts are best diagnosed with an exam of the eyes with the pupils enlarged (dilated) by drops.  TREATMENT  For an early cataract, vision may improve by using different eyeglasses or stronger lighting. If that does not help your vision, surgery is the only effective treatment. A cataract needs to be surgically removed when vision loss interferes with your everyday activities, such as driving, reading, or watching TV. A cataract may also have to be removed if it prevents examination or treatment of another eye problem. Surgery removes the cloudy lens and usually replaces it with a substitute lens (intraocular lens, IOL).  At a time when both you and your doctor agree, the cataract will be surgically removed. If you have cataracts in both eyes, only one is usually removed at a time. This allows the operated eye to heal and be out of danger from any possible problems after surgery (such as infection or poor wound healing). In rare cases, a cataract may be doing damage to your eye. In these cases, your caregiver may advise surgical removal right away. The vast majority of people who have cataract surgery have better vision afterward. HOME CARE INSTRUCTIONS  If you are not planning surgery, you may be asked to do the following:  Use different eyeglasses.   Use stronger or brighter lighting.   Ask your eye  doctor about reducing your medicine dose or changing medicines if it is thought that a medicine caused your cataract. Changing medicines does not make the cataract go away on its own.   Become familiar with your surroundings. Poor vision can lead to injury. Avoid bumping into things on the affected side. You are at a higher risk for tripping or falling.   Exercise extreme care when driving or operating machinery.   Wear sunglasses if you are sensitive to bright light or experiencing problems with glare.  SEEK IMMEDIATE  MEDICAL CARE IF:   You have a worsening or sudden vision loss.   You notice redness, swelling, or increasing pain in the eye.   You have a fever.  Document Released: 11/23/2005 Document Revised: 11/12/2011 Document Reviewed: 07/17/2011 [facility] Patient Information 2012 [place].PATIENT INSTRUCTIONS POST-ANESTHESIA  IMMEDIATELY FOLLOWING SURGERY:  Do not drive or operate machinery for the first twenty four hours after surgery.  Do not make any important decisions for twenty four hours after surgery or while taking narcotic pain medications or sedatives.  If you develop intractable nausea and vomiting or a severe headache please notify your doctor immediately.  FOLLOW-UP:  Please make an appointment with your surgeon as instructed. You do not need to follow up with anesthesia unless specifically instructed to do so.  WOUND CARE INSTRUCTIONS (if applicable):  Keep a dry clean dressing on the anesthesia/puncture wound site if there is drainage.  Once the wound has quit draining you may leave it open to air.  Generally you should leave the bandage intact for twenty four hours unless there is drainage.  If the epidural site drains for more than 36-48 hours please call the anesthesia department.  QUESTIONS?:  Please feel free to call your physician or the hospital operator if you have any questions, and they will be happy to assist you.

## 2016-05-11 NOTE — Progress Notes (Signed)
   HPI  Patient presents today here for follow-up.  Hyperlipidemia Problems medications, good compliance Facet today for labs.  BPH Doing better with Flomax and Proscar, having one episode of nocturia per night.  Chronic back pain and neuropathy Continued, helped by gabapentin twice a day  Mood States he feels good in general, needs refill of Celexa today.  Hypothyroidism Good medication compliance No symptoms  Leg swelling Bilateral, described as mostly medial ankle swelling with mild discomfort throughout the day, both legs have symptoms of the right is greater than left, this is been going on for years.   PMH: Smoking status noted ROS: Per HPI  Objective: BP 137/70 mmHg  Pulse 66  Temp(Src) 97 F (36.1 C) (Oral)  Ht _0  (1.803 m)  Wt 187 lb (84.823 kg)  BMI 26.09 kg/m2 Gen: NAD, alert, cooperative with exam HEENT: NCAT CV: RRR, good S1/S2, no murmur Resp: CTABL, no wheezes, non-labored Ext: 1+ pitting edema bilateral lower medial ankles, no hemosiderin staining Neuro: Alert and oriented, No gross deficits  Assessment and plan:  # Venous insufficiency Compression stockings, discussed  # BPH Symptoms improved quite a bit Continue Proscar and Flomax  # Hyperlipidemia Labs, refill Vytorin  # Hypothyroidism Labs, continue Synthroid  Neuropathy, arthritis, chronic back pain Helped by gabapentin Continue, refill    Orders Placed This Encounter  Procedures  . Lipid Panel  . CMP14+EGFR  . CBC with Differential  . TSH    Meds ordered this encounter  Medications  . DISCONTD: gabapentin (NEURONTIN) 800 MG tablet    Sig: Take 1 tablet (800 mg total) by mouth 3 (three) times daily.    Dispense:  270 tablet    Refill:  3  . citalopram (CELEXA) 20 MG tablet    Sig: TAKE 1 TABLET (20 MG TOTAL) BY MOUTH DAILY.    Dispense:  90 tablet    Refill:  3  . clopidogrel (PLAVIX) 75 MG tablet    Sig: TAKE 1 TABLET (75 MG TOTAL) BY MOUTH DAILY.   Dispense:  90 tablet    Refill:  3  . ezetimibe-simvastatin (VYTORIN) 10-40 MG tablet    Sig: Take 1 tablet by mouth at bedtime.    Dispense:  90 tablet    Refill:  3    90 DAY CELEBREX AS WELL  . pantoprazole (PROTONIX) 40 MG tablet    Sig: TAKE 1 TABLET (40 MG TOTAL) BY MOUTH DAILY.    Dispense:  90 tablet    Refill:  3  . levothyroxine (SYNTHROID, LEVOTHROID) 25 MCG tablet    Sig: TAKE 1 TABLET (25 MCG TOTAL) BY MOUTH DAILY.    Dispense:  90 tablet    Refill:  3  . gabapentin (NEURONTIN) 800 MG tablet    Sig: Take 1 tablet (800 mg total) by mouth 2 (two) times daily.    Dispense:  180 tablet    Refill:  3    Correction, only BID    Laroy Apple, MD [facility] 05/11/2016, 8:13 AM

## 2016-05-11 NOTE — Patient Instructions (Signed)
Great to see you!  I am glad to hear you are doing so well.   We have sent in your prescriptions  We will call within 1 week with lab results

## 2016-05-12 ENCOUNTER — Encounter (HOSPITAL_COMMUNITY)
Admission: RE | Admit: 2016-05-12 | Discharge: 2016-05-12 | Disposition: A | Discharge status: Home / Self Care | Payer: Medicare (Managed Care) | Source: Ambulatory Visit | Attending: Ophthalmology | Admitting: Ophthalmology

## 2016-05-12 ENCOUNTER — Other Ambulatory Visit: Payer: Self-pay

## 2016-05-12 ENCOUNTER — Encounter (HOSPITAL_COMMUNITY): Payer: Self-pay

## 2016-05-12 DIAGNOSIS — Z01810 Encounter for preprocedural cardiovascular examination: Secondary | ICD-10-CM | POA: Insufficient documentation

## 2016-05-12 HISTORY — DX: Benign prostatic hyperplasia without lower urinary tract symptoms: N40.0

## 2016-05-12 HISTORY — DX: Unspecified hearing loss, unspecified ear: H91.90

## 2016-05-12 HISTORY — DX: Anxiety disorder, unspecified: F41.9

## 2016-05-12 HISTORY — DX: Hypothyroidism, unspecified: E03.9

## 2016-05-12 LAB — CMP14+EGFR
ALBUMIN: 4.1 g/dL (ref 3.5–4.7)
ALK PHOS: 55 IU/L (ref 39–117)
ALT: 16 IU/L (ref 0–44)
AST: 22 IU/L (ref 0–40)
Albumin/Globulin Ratio: 1.8 (ref 1.2–2.2)
BUN / CREAT RATIO: 15 (ref 10–24)
BUN: 19 mg/dL (ref 8–27)
Bilirubin Total: 0.8 mg/dL (ref 0.0–1.2)
CALCIUM: 9.3 mg/dL (ref 8.6–10.2)
CO2: 23 mmol/L (ref 18–29)
CREATININE: 1.31 mg/dL — AB (ref 0.76–1.27)
Chloride: 105 mmol/L (ref 96–106)
GFR calc Af Amer: 59 mL/min/{1.73_m2} — ABNORMAL LOW (ref >59)
GFR, EST NON AFRICAN AMERICAN: 51 mL/min/{1.73_m2} — AB (ref >59)
GLOBULIN, TOTAL: 2.3 g/dL (ref 1.5–4.5)
GLUCOSE: 93 mg/dL (ref 65–99)
Potassium: 4.3 mmol/L (ref 3.5–5.2)
SODIUM: 142 mmol/L (ref 134–144)
TOTAL PROTEIN: 6.4 g/dL (ref 6.0–8.5)

## 2016-05-12 LAB — CBC WITH DIFFERENTIAL/PLATELET
BASOS: 1 %
Basophils Absolute: 0.1 10*3/uL (ref 0.0–0.2)
EOS (ABSOLUTE): 0.2 10*3/uL (ref 0.0–0.4)
EOS: 5 %
HEMATOCRIT: 40.1 % (ref 37.5–51.0)
HEMOGLOBIN: 13.3 g/dL (ref 12.6–17.7)
IMMATURE GRANULOCYTES: 0 %
Immature Grans (Abs): 0 10*3/uL (ref 0.0–0.1)
LYMPHS ABS: 1.2 10*3/uL (ref 0.7–3.1)
Lymphs: 27 %
MCH: 30.5 pg (ref 26.6–33.0)
MCHC: 33.2 g/dL (ref 31.5–35.7)
MCV: 92 fL (ref 79–97)
MONOCYTES: 7 %
Monocytes Absolute: 0.3 10*3/uL (ref 0.1–0.9)
Neutrophils Absolute: 2.5 10*3/uL (ref 1.4–7.0)
Neutrophils: 60 %
Platelets: 150 10*3/uL (ref 150–379)
RBC: 4.36 x10E6/uL (ref 4.14–5.80)
RDW: 13.2 % (ref 12.3–15.4)
WBC: 4.3 10*3/uL (ref 3.4–10.8)

## 2016-05-12 LAB — LIPID PANEL
CHOLESTEROL TOTAL: 143 mg/dL (ref 100–199)
Chol/HDL Ratio: 2.5 ratio units (ref 0.0–5.0)
HDL: 58 mg/dL (ref >39)
LDL Calculated: 70 mg/dL (ref 0–99)
Triglycerides: 75 mg/dL (ref 0–149)
VLDL CHOLESTEROL CAL: 15 mg/dL (ref 5–40)

## 2016-05-12 LAB — TSH: TSH: 2.34 u[IU]/mL (ref 0.450–4.500)

## 2016-05-12 NOTE — Pre-Procedure Instructions (Signed)
Patient given information to sign up for my chart at home. 

## 2016-05-18 ENCOUNTER — Ambulatory Visit (HOSPITAL_COMMUNITY)
Admission: RE | Admit: 2016-05-18 | Discharge: 2016-05-18 | Disposition: A | Discharge status: Home / Self Care | Payer: Medicare Other | Source: Ambulatory Visit | Attending: Ophthalmology | Admitting: Ophthalmology

## 2016-05-18 ENCOUNTER — Encounter (HOSPITAL_COMMUNITY)
Admission: RE | Disposition: A | Discharge status: Home / Self Care | Payer: Self-pay | Source: Ambulatory Visit | Attending: Ophthalmology

## 2016-05-18 ENCOUNTER — Encounter (HOSPITAL_COMMUNITY): Payer: Self-pay | Admitting: *Deleted

## 2016-05-18 ENCOUNTER — Ambulatory Visit (HOSPITAL_COMMUNITY): Payer: Medicare Other | Admitting: Anesthesiology

## 2016-05-18 DIAGNOSIS — Z85828 Personal history of other malignant neoplasm of skin: Secondary | ICD-10-CM | POA: Insufficient documentation

## 2016-05-18 DIAGNOSIS — Z7901 Long term (current) use of anticoagulants: Secondary | ICD-10-CM | POA: Insufficient documentation

## 2016-05-18 DIAGNOSIS — E7800 Pure hypercholesterolemia, unspecified: Secondary | ICD-10-CM | POA: Diagnosis not present

## 2016-05-18 DIAGNOSIS — H2511 Age-related nuclear cataract, right eye: Secondary | ICD-10-CM | POA: Insufficient documentation

## 2016-05-18 DIAGNOSIS — M1990 Unspecified osteoarthritis, unspecified site: Secondary | ICD-10-CM | POA: Insufficient documentation

## 2016-05-18 DIAGNOSIS — F419 Anxiety disorder, unspecified: Secondary | ICD-10-CM | POA: Insufficient documentation

## 2016-05-18 DIAGNOSIS — E039 Hypothyroidism, unspecified: Secondary | ICD-10-CM | POA: Diagnosis not present

## 2016-05-18 DIAGNOSIS — Z79899 Other long term (current) drug therapy: Secondary | ICD-10-CM | POA: Diagnosis not present

## 2016-05-18 DIAGNOSIS — K219 Gastro-esophageal reflux disease without esophagitis: Secondary | ICD-10-CM | POA: Diagnosis not present

## 2016-05-18 HISTORY — PX: CATARACT EXTRACTION W/PHACO: SHX586

## 2016-05-18 SURGERY — PHACOEMULSIFICATION, CATARACT, WITH IOL INSERTION
Anesthesia: Monitor Anesthesia Care | Site: Eye | Laterality: Right

## 2016-05-18 MED ORDER — EPINEPHRINE HCL 1 MG/ML IJ SOLN
INTRAMUSCULAR | Status: AC
  Filled 2016-05-18: qty 1

## 2016-05-18 MED ORDER — FENTANYL CITRATE (PF) 100 MCG/2ML IJ SOLN
25.0000 ug | INTRAMUSCULAR | Status: AC | PRN
  Administered 2016-05-18 (×2): 25 ug via INTRAVENOUS

## 2016-05-18 MED ORDER — PROVISC 10 MG/ML IO SOLN
INTRAOCULAR | Status: DC | PRN
  Administered 2016-05-18: 0.85 mL via INTRAOCULAR

## 2016-05-18 MED ORDER — LIDOCAINE HCL 3.5 % OP GEL: 1.0000 "application " | Freq: Once | OPHTHALMIC | Status: DC

## 2016-05-18 MED ORDER — EPINEPHRINE HCL 1 MG/ML IJ SOLN
INTRAMUSCULAR | Status: DC | PRN
  Administered 2016-05-18: 500 mL

## 2016-05-18 MED ORDER — CYCLOPENTOLATE-PHENYLEPHRINE 0.2-1 % OP SOLN
1.0000 [drp] | OPHTHALMIC | Status: AC
  Administered 2016-05-18 (×3): 1 [drp] via OPHTHALMIC

## 2016-05-18 MED ORDER — TETRACAINE HCL 0.5 % OP SOLN
1.0000 [drp] | OPHTHALMIC | Status: AC
  Administered 2016-05-18 (×3): 1 [drp] via OPHTHALMIC

## 2016-05-18 MED ORDER — MIDAZOLAM HCL 2 MG/2ML IJ SOLN
INTRAMUSCULAR | Status: AC
  Filled 2016-05-18: qty 2

## 2016-05-18 MED ORDER — FENTANYL CITRATE (PF) 100 MCG/2ML IJ SOLN
INTRAMUSCULAR | Status: AC
  Filled 2016-05-18: qty 2

## 2016-05-18 MED ORDER — PHENYLEPHRINE HCL 2.5 % OP SOLN
1.0000 [drp] | OPHTHALMIC | Status: AC
  Administered 2016-05-18 (×3): 1 [drp] via OPHTHALMIC

## 2016-05-18 MED ORDER — NEOMYCIN-POLYMYXIN-DEXAMETH 3.5-10000-0.1 OP SUSP
OPHTHALMIC | Status: DC | PRN
  Administered 2016-05-18: 2 [drp] via OPHTHALMIC

## 2016-05-18 MED ORDER — POVIDONE-IODINE 5 % OP SOLN
OPHTHALMIC | Status: DC | PRN
  Administered 2016-05-18: 1 via OPHTHALMIC

## 2016-05-18 MED ORDER — LIDOCAINE HCL (PF) 1 % IJ SOLN
INTRAOCULAR | Status: DC | PRN
  Administered 2016-05-18: .4 mL via OPHTHALMIC

## 2016-05-18 MED ORDER — BSS IO SOLN
INTRAOCULAR | Status: DC | PRN
  Administered 2016-05-18: 15 mL via INTRAOCULAR

## 2016-05-18 MED ORDER — MIDAZOLAM HCL 2 MG/2ML IJ SOLN
1.0000 mg | INTRAMUSCULAR | Status: DC | PRN
  Administered 2016-05-18 (×2): 2 mg via INTRAVENOUS
  Filled 2016-05-18: qty 2

## 2016-05-18 MED ORDER — LIDOCAINE 3.5 % OP GEL OPTIME - NO CHARGE
OPHTHALMIC | Status: DC | PRN
  Administered 2016-05-18: 1 [drp] via OPHTHALMIC

## 2016-05-18 MED ORDER — LACTATED RINGERS IV SOLN
INTRAVENOUS | Status: DC
  Administered 2016-05-18: 09:00:00 via INTRAVENOUS

## 2016-05-18 SURGICAL SUPPLY — 23 items
CAPSULAR TENSION RING-AMO (OPHTHALMIC RELATED) IMPLANT
CLOTH BEACON ORANGE TIMEOUT ST (SAFETY) ×2 IMPLANT
EYE SHIELD UNIVERSAL CLEAR (GAUZE/BANDAGES/DRESSINGS) ×2 IMPLANT
GLOVE BIOGEL PI IND STRL 7.0 (GLOVE) IMPLANT
GLOVE BIOGEL PI IND STRL 7.5 (GLOVE) IMPLANT
GLOVE BIOGEL PI INDICATOR 7.0 (GLOVE) ×2
GLOVE BIOGEL PI INDICATOR 7.5 (GLOVE)
GLOVE EXAM NITRILE LRG STRL (GLOVE) IMPLANT
GLOVE EXAM NITRILE MD LF STRL (GLOVE) ×2 IMPLANT
KIT VITRECTOMY (OPHTHALMIC RELATED) IMPLANT
PAD ARMBOARD 7.5X6 YLW CONV (MISCELLANEOUS) ×2 IMPLANT
PROC W NO LENS (INTRAOCULAR LENS)
PROC W SPEC LENS (INTRAOCULAR LENS)
PROCESS W NO LENS (INTRAOCULAR LENS) IMPLANT
PROCESS W SPEC LENS (INTRAOCULAR LENS) IMPLANT
RETRACTOR IRIS SIGHTPATH (OPHTHALMIC RELATED) IMPLANT
RING MALYGIN (MISCELLANEOUS) IMPLANT
SIGHTPATH CAT PROC W REG LENS (Ophthalmic Related) ×3 IMPLANT
SYRINGE LUER LOK 1CC (MISCELLANEOUS) ×2 IMPLANT
TAPE SURG TRANSPORE 1 IN (GAUZE/BANDAGES/DRESSINGS) IMPLANT
TAPE SURGICAL TRANSPORE 1 IN (GAUZE/BANDAGES/DRESSINGS) ×2
VISCOELASTIC ADDITIONAL (OPHTHALMIC RELATED) IMPLANT
WATER STERILE IRR 250ML POUR (IV SOLUTION) ×2 IMPLANT

## 2016-05-18 NOTE — Transfer of Care (Signed)
Immediate Anesthesia Transfer of Care Note  Patient: Michael Mcclure  Procedure(s) Performed: Procedure(s): CATARACT EXTRACTION PHACO AND INTRAOCULAR LENS PLACEMENT RIGHT EYE; CDE:  5.35 (Right)  Patient Location: Short Stay  Anesthesia Type:MAC  Level of Consciousness: awake  Airway & Oxygen Therapy: Patient Spontanous Breathing  Post-op Assessment: Report given to RN  Post vital signs: Reviewed  Last Vitals:  Filed Vitals:   05/18/16 0915 05/18/16 0920  BP: 130/70 134/74  Pulse:    Temp:    Resp: 14 17    Last Pain: There were no vitals filed for this visit.    Patients Stated Pain Goal: 7 (05/18/16 [id number])  Complications: No apparent anesthesia complications

## 2016-05-18 NOTE — Anesthesia Postprocedure Evaluation (Signed)
Anesthesia Post Note  Patient: Michael Mcclure  Procedure(s) Performed: Procedure(s) (LRB): CATARACT EXTRACTION PHACO AND INTRAOCULAR LENS PLACEMENT RIGHT EYE; CDE:  5.35 (Right)  Patient location during evaluation: Short Stay Anesthesia Type: MAC Level of consciousness: awake and alert Pain management: pain level controlled Vital Signs Assessment: post-procedure vital signs reviewed and stable Respiratory status: spontaneous breathing Cardiovascular status: blood pressure returned to baseline Postop Assessment: no signs of nausea or vomiting Anesthetic complications: no    Last Vitals:  Filed Vitals:   05/18/16 0915 05/18/16 0920  BP: 130/70 134/74  Pulse:    Temp:    Resp: 14 17    Last Pain: There were no vitals filed for this visit.               Kei Langhorst

## 2016-05-18 NOTE — H&P (Signed)
I have reviewed the H&P, the patient was re-examined, and I have identified no interval changes in medical condition and plan of care since the history and physical of record  

## 2016-05-18 NOTE — Anesthesia Preprocedure Evaluation (Signed)
Anesthesia Evaluation  Patient identified by MRN, date of birth, ID band Patient awake    Reviewed: Allergy & Precautions, NPO status , Patient's Chart, lab work & pertinent test results  Airway Mallampati: I  TM Distance: >3 FB     Dental  (+) Edentulous Upper, Edentulous Lower   Pulmonary neg pulmonary ROS,    breath sounds clear to auscultation       Cardiovascular + Peripheral Vascular Disease   Rhythm:Regular Rate:Normal     Neuro/Psych PSYCHIATRIC DISORDERS Anxiety    GI/Hepatic GERD  ,  Endo/Other  Hypothyroidism   Renal/GU      Musculoskeletal   Abdominal   Peds  Hematology   Anesthesia Other Findings   Reproductive/Obstetrics                             Anesthesia Physical Anesthesia Plan  ASA: II  Anesthesia Plan: MAC   Post-op Pain Management:    Induction: Intravenous  Airway Management Planned: Nasal Cannula  Additional Equipment:   Intra-op Plan:   Post-operative Plan:   Informed Consent: I have reviewed the patients History and Physical, chart, labs and discussed the procedure including the risks, benefits and alternatives for the proposed anesthesia with the patient or authorized representative who has indicated his/her understanding and acceptance.     Plan Discussed with:   Anesthesia Plan Comments:         Anesthesia Quick Evaluation

## 2016-05-18 NOTE — Op Note (Signed)
Date of Admission: 05/18/2016  Date of Surgery: 05/18/2016   Pre-Op Dx: Cataract Right Eye  Post-Op Dx: Senile Nuclear Cataract Right  Eye,  Dx Code H25.11  Surgeon: Gemma PayorKerry Caralynn Gelber, M.D.  Assistants: None  Anesthesia: Topical with MAC  Indications: Painless, progressive loss of vision with compromise of daily activities.  Surgery: Cataract Extraction with Intraocular lens Implant Right Eye  Discription: The patient had dilating drops and viscous lidocaine placed into the Right eye in the pre-op holding area. After transfer to the operating room, a time out was performed. The patient was then prepped and draped. Beginning with a 75 degree blade a paracentesis port was made at the surgeon's 2 o'clock position. The anterior chamber was then filled with 1% non-preserved lidocaine. This was followed by filling the anterior chamber with Provisc.  A 2.524mm keratome blade was used to make a clear corneal incision at the temporal limbus.  A bent cystatome needle was used to create a continuous tear capsulotomy. Hydrodissection was performed with balanced salt solution on a Fine canula. The lens nucleus was then removed using the phacoemulsification handpiece. Residual cortex was removed with the I&A handpiece. The anterior chamber and capsular bag were refilled with Provisc. A posterior chamber intraocular lens was placed into the capsular bag with it's injector. The implant was positioned with the Kuglan hook. The Provisc was then removed from the anterior chamber and capsular bag with the I&A handpiece. Stromal hydration of the main incision and paracentesis port was performed with BSS on a Fine canula. The wounds were tested for leak which was negative. The patient tolerated the procedure well. There were no operative complications. The patient was then transferred to the recovery room in stable condition.  Complications: None  Specimen: None  EBL: None  Prosthetic device: Hoya iSert 250, power 20.5 D,  SN [id number].

## 2016-05-18 NOTE — Discharge Instructions (Signed)

## 2016-05-19 ENCOUNTER — Encounter (HOSPITAL_COMMUNITY): Payer: Self-pay | Admitting: Ophthalmology

## 2016-06-09 ENCOUNTER — Other Ambulatory Visit: Payer: Self-pay | Admitting: Family Medicine

## 2016-07-15 ENCOUNTER — Ambulatory Visit (INDEPENDENT_AMBULATORY_CARE_PROVIDER_SITE_OTHER): Payer: PRIVATE HEALTH INSURANCE | Admitting: Family Medicine

## 2016-07-15 ENCOUNTER — Encounter: Payer: Self-pay | Admitting: Family Medicine

## 2016-07-15 VITALS — BP 136/82 | HR 68 | Temp 97.0°F | Ht 71.0 in | Wt 187.9 lb

## 2016-07-15 DIAGNOSIS — S46912A Strain of unspecified muscle, fascia and tendon at shoulder and upper arm level, left arm, initial encounter: Secondary | ICD-10-CM

## 2016-07-15 MED ORDER — METHYLPREDNISOLONE ACETATE 80 MG/ML IJ SUSP
80.0000 mg | Freq: Once | INTRAMUSCULAR | Status: AC
  Administered 2016-07-15: 80 mg via INTRAMUSCULAR

## 2016-07-15 NOTE — Progress Notes (Signed)
BP 136/82 (BP Location: Right Arm, Patient Position: Sitting, Cuff Size: Large)   Pulse 68   Temp 97 F (36.1 C) (Oral)   Ht 5\' 11"  (1.803 m)   Wt 187 lb 14.4 oz (85.2 kg)   BMI 26.21 kg/m    Subjective:    Patient ID: Michael Mcclure, male    DOB: Jun 16, 1932, 82 y.o.   MRN: [id number]  HPI: Michael Butteracy J Twaddle is a 82 y.o. male presenting on 07/15/2016 for Neck and shoulder pain (left side)   HPI Neck and shoulder strain, left Patient has been having neck soreness and tightness in the muscles that's been going on for the past few months. He has been working around the yard a lot and doing stuff like that and that is probably where the tightness comes from and he also wants to make sure it is not his rotator cuff. He has had issues with his rotator cuff before. He denies any numbness or weakness going down either one of his arms. Mainly on the left side of his neck musculature and down towards his shoulder that he is feeling tightness. He denies any loss of range of motion  Relevant past medical, surgical, family and social history reviewed and updated as indicated. Interim medical history since our last visit reviewed. Allergies and medications reviewed and updated.  Review of Systems  Constitutional: Negative for fever.  HENT: Negative for ear discharge and ear pain.   Eyes: Negative for discharge and visual disturbance.  Respiratory: Negative for shortness of breath and wheezing.   Cardiovascular: Negative for chest pain and leg swelling.  Gastrointestinal: Negative for abdominal pain, constipation and diarrhea.  Genitourinary: Negative for difficulty urinating.  Musculoskeletal: Positive for myalgias. Negative for arthralgias, back pain and gait problem.  Skin: Negative for rash.  Neurological: Negative for syncope, weakness, light-headedness, numbness and headaches.  All other systems reviewed and are negative.   Per HPI unless specifically indicated above     Medication  List       Accurate as of 07/15/16 10:28 AM. Always use your most recent med list.          BIOFREEZE EX Apply 1 application topically daily as needed (pain).   celecoxib 200 MG capsule Commonly known as:  CELEBREX TAKE 1 CAPSULE (200 MG TOTAL) BY MOUTH 2 (TWO) TIMES DAILY.   citalopram 20 MG tablet Commonly known as:  CELEXA TAKE 1 TABLET (20 MG TOTAL) BY MOUTH DAILY.   clopidogrel 75 MG tablet Commonly known as:  PLAVIX TAKE 1 TABLET (75 MG TOTAL) BY MOUTH DAILY.   ezetimibe-simvastatin 10-40 MG tablet Commonly known as:  VYTORIN Take 1 tablet by mouth at bedtime.   Flax Seed Oil 1000 MG Caps Take by mouth.   gabapentin 800 MG tablet Commonly known as:  NEURONTIN Take 1 tablet (800 mg total) by mouth 2 (two) times daily.   levothyroxine 25 MCG tablet Commonly known as:  SYNTHROID, LEVOTHROID TAKE 1 TABLET (25 MCG TOTAL) BY MOUTH DAILY.   pantoprazole 40 MG tablet Commonly known as:  PROTONIX TAKE 1 TABLET (40 MG TOTAL) BY MOUTH DAILY.   tamsulosin 0.4 MG Caps capsule Commonly known as:  FLOMAX Take 1 capsule (0.4 mg total) by mouth daily.   Vitamin D 2000 units Caps Take by mouth.          Objective:    BP 136/82 (BP Location: Right Arm, Patient Position: Sitting, Cuff Size: Large)   Pulse 68   Temp  97 F (36.1 C) (Oral)   Ht  (1.803 m)   Wt 187 lb 14.4 oz (85.2 kg)   BMI 26.21 kg/m   Wt Readings from Last 3 Encounters:  07/15/16 187 lb 14.4 oz (85.2 kg)  05/18/16 188 lb (85.3 kg)  05/12/16 188 lb (85.3 kg)    Physical Exam  Constitutional: He is oriented to person, place, and time. He appears well-developed and well-nourished. No distress.  Eyes: Conjunctivae and EOM are normal. Pupils are equal, round, and reactive to light. Right eye exhibits no discharge. No scleral icterus.  Neck: Neck supple. No thyromegaly present.  Cardiovascular: Normal rate, regular rhythm, normal heart sounds and intact distal pulses.   No murmur  heard. Pulmonary/Chest: Effort normal and breath sounds normal. No respiratory distress. He has no wheezes.  Musculoskeletal: Normal range of motion. He exhibits no edema.       Cervical back: He exhibits tenderness. He exhibits normal range of motion, no bony tenderness, no swelling and normal pulse.       Back:  Lymphadenopathy:    He has no cervical adenopathy.  Neurological: He is alert and oriented to person, place, and time. Coordination normal.  Skin: Skin is warm and dry. No rash noted. He is not diaphoretic.  Psychiatric: He has a normal mood and affect. His behavior is normal.  Nursing note and vitals reviewed.      Assessment & Plan:   Problem List Items Addressed This Visit    None    Visit Diagnoses    Left shoulder strain, initial encounter    -  Primary   Relevant Medications   methylPREDNISolone acetate (DEPO-MEDROL) injection 80 mg (Completed)       Follow up plan: Return if symptoms worsen or fail to improve.  Counseling provided for all of the vaccine components No orders of the defined types were placed in this encounter.   Arville Care, MD [facility] 07/15/2016, 10:28 AM

## 2016-07-27 ENCOUNTER — Ambulatory Visit (INDEPENDENT_AMBULATORY_CARE_PROVIDER_SITE_OTHER): Payer: PRIVATE HEALTH INSURANCE | Admitting: Nurse Practitioner

## 2016-07-27 VITALS — BP 124/59 | HR 69 | Temp 97.6°F | Ht 71.0 in | Wt 189.0 lb

## 2016-07-27 DIAGNOSIS — L989 Disorder of the skin and subcutaneous tissue, unspecified: Secondary | ICD-10-CM

## 2016-07-27 DIAGNOSIS — R238 Other skin changes: Secondary | ICD-10-CM | POA: Diagnosis not present

## 2016-07-27 DIAGNOSIS — R0989 Other specified symptoms and signs involving the circulatory and respiratory systems: Secondary | ICD-10-CM | POA: Diagnosis not present

## 2016-07-27 NOTE — Progress Notes (Signed)
   Subjective:    Patient ID: Michael Mcclure, male    DOB: May 15, 1932, [age] y.o.   MRN: [id number]  HPI Patient comes in c/o top of his head burning- has been bothering him for about a week- He has seen [place]rolgist in [place] for this and he said he had bockage in carotid arteries and put him on blood thinners which helped the burning sensation. He is still taking plavix. He descries a BURNING sensation in scalp.    Review of Systems  Constitutional: Negative.   Respiratory: Negative.   Cardiovascular: Negative.   Genitourinary: Negative.   Neurological: Negative.   Psychiatric/Behavioral: Negative.   All other systems reviewed and are negative.      Objective:   Physical Exam  Constitutional: He is oriented to person, place, and time. He appears well-developed and well-nourished.  Cardiovascular: Normal rate, regular rhythm and normal heart sounds.   Faint carotid bruits bil  Pulmonary/Chest: Effort normal.  Neurological: He is alert and oriented to person, place, and time. He has normal reflexes.  Skin: Skin is warm.  Scalp looks normal  Psychiatric: He has a normal mood and affect. His behavior is normal. Judgment and thought content normal.   BP (!) 124/59   Pulse 69   Temp 97.6 F (36.4 C) (Oral)   Ht 5\' 11"  (1.803 m)   Wt 189 lb (85.7 kg)   BMI 26.36 kg/m        Assessment & Plan:   1. Bilateral carotid bruits   2. Disorder of scalp    Ordered bil carotid doppler study because patient insited that was his problem last time he had sensation Patient was very difficult to follow  And could not understand taht neurologist does not do blockages in carotid atreries. He was also confused because said he sees dr. Channing Muttersoy for his heart but then he said he did back surgery on him. He says he is not confused and feels fine otherwise- no headaches, blurred vision or neuro problems. I will start wit carotid doppler and see hwere that leads us.  Mary-Margaret Daphine DeutscherMartin,  FNP

## 2016-07-29 ENCOUNTER — Ambulatory Visit (HOSPITAL_COMMUNITY): Admission: RE | Admit: 2016-07-29 | Payer: Medicare (Managed Care) | Source: Ambulatory Visit

## 2016-09-24 ENCOUNTER — Telehealth: Payer: Self-pay | Admitting: Family Medicine

## 2016-09-24 MED ORDER — CELECOXIB 200 MG PO CAPS: 200.0000 mg | ORAL_CAPSULE | Freq: Every day | ORAL | 0 refills | Status: DC

## 2016-09-24 NOTE — Telephone Encounter (Signed)
Needs f/u with discussion to see if we can find an alternative prior to next fill.    This is not an ideal medication with his kidney function. Also he should only be taking 1 pill once daily, not twice daily ( unless he is taking 100 mg capsules).   Murtis SinkSam Ilea Hilton, MD [facility] 09/24/2016, 1:39 PM

## 2016-09-24 NOTE — Telephone Encounter (Signed)
Pt is aware of MD feedback and will schedule appt soon.

## 2016-10-02 ENCOUNTER — Encounter (INDEPENDENT_AMBULATORY_CARE_PROVIDER_SITE_OTHER): Payer: Self-pay

## 2016-10-02 ENCOUNTER — Encounter: Payer: Self-pay | Admitting: Family Medicine

## 2016-10-02 ENCOUNTER — Ambulatory Visit (INDEPENDENT_AMBULATORY_CARE_PROVIDER_SITE_OTHER): Payer: Medicare Other | Admitting: Family Medicine

## 2016-10-02 VITALS — BP 126/64 | HR 76 | Temp 96.9°F | Ht 71.0 in | Wt 188.8 lb

## 2016-10-02 DIAGNOSIS — G8929 Other chronic pain: Secondary | ICD-10-CM | POA: Diagnosis not present

## 2016-10-02 DIAGNOSIS — N183 Chronic kidney disease, stage 3 unspecified: Secondary | ICD-10-CM

## 2016-10-02 DIAGNOSIS — K219 Gastro-esophageal reflux disease without esophagitis: Secondary | ICD-10-CM

## 2016-10-02 DIAGNOSIS — M549 Dorsalgia, unspecified: Secondary | ICD-10-CM | POA: Diagnosis not present

## 2016-10-02 DIAGNOSIS — Z23 Encounter for immunization: Secondary | ICD-10-CM

## 2016-10-02 MED ORDER — PREGABALIN 75 MG PO CAPS: 75.0000 mg | ORAL_CAPSULE | Freq: Two times a day (BID) | ORAL | 2 refills | Status: DC

## 2016-10-02 NOTE — Patient Instructions (Signed)
Great to see you!  Come back in 3 months unless you need us sooner.   Stop gabapentin, replace it with lyrica

## 2016-10-02 NOTE — Progress Notes (Signed)
   HPI  Patient presents today here for follow-up of GERD, back pain, and chronic kidney disease.  GERD Reasonably well-controlled Protonix Patient does not want to try a trial off.  Back pain Chronic midline low back pain with right-sided sciatica intermittently Status post lumbar disc removal and fusion 2 years ago, patient has developed more pain and states that his neurosurgeon says he may need another surgery Pain is intermittent, he does not really want surgery. He uses when necessary oxycodone from his neurosurgeon He is using gabapentin for 20+ years without any clear improvement.  Chronic kidney disease Takes Celebrex for several years now Has recently reduced to once daily  PMH: Smoking status noted ROS: Per HPI  Objective: BP 126/64   Pulse 76   Temp (!) 96.9 F (36.1 C) (Oral)   Ht _0  (1.803 m)   Wt 188 lb 12.8 oz (85.6 kg)   BMI 26.33 kg/m  Gen: NAD, alert, cooperative with exam HEENT: NCAT CV: RRR, good S1/S2, no murmur Resp: CTABL, no wheezes, non-labored Ext: No edema, warm Neuro: Alert and oriented, straight leg raise negative Musculoskeletal No tenderness to palpation of midline lumbar spine or paraspinal muscles  Assessment and plan:  # Chronic midline back pain With some radiculopathy with intermittent sciatica Changing gabapentin to Lyrica Okay to titrate if he sees any improvement  # Chronic kidney disease stage III Patient heparin around GFR 50-65 I have recently reduced his Celebrex dose to once daily over the phone His pain is reasonable with this Rechecking labs today, plan reduce NSAID burden given age and CKD 3  # GERD Reasonably well-controlled Protonix He does not want to try trial of Continue   Influenza vaccine given today, counseling provided for all components    Orders Placed This Encounter  Procedures  . Flu vaccine HIGH DOSE PF  . BMP8+EGFR    Meds ordered this encounter  Medications  . pregabalin  (LYRICA) 75 MG capsule    Sig: Take 1 capsule (75 mg total) by mouth 2 (two) times daily.    Dispense:  60 capsule    Refill:  [place], MD [facility] 10/02/2016, 9:34 AM

## 2016-10-03 LAB — BMP8+EGFR
BUN/Creatinine Ratio: 17 (ref 10–24)
BUN: 19 mg/dL (ref 8–27)
CALCIUM: 9.2 mg/dL (ref 8.6–10.2)
CHLORIDE: 102 mmol/L (ref 96–106)
CO2: 23 mmol/L (ref 18–29)
Creatinine, Ser: 1.11 mg/dL (ref 0.76–1.27)
GFR, EST AFRICAN AMERICAN: 71 mL/min/{1.73_m2} (ref >59)
GFR, EST NON AFRICAN AMERICAN: 62 mL/min/{1.73_m2} (ref >59)
Glucose: 130 mg/dL — ABNORMAL HIGH (ref 65–99)
Potassium: 4.1 mmol/L (ref 3.5–5.2)
Sodium: 140 mmol/L (ref 134–144)

## 2016-11-02 ENCOUNTER — Other Ambulatory Visit: Payer: Self-pay | Admitting: Family Medicine

## 2016-11-02 ENCOUNTER — Ambulatory Visit (INDEPENDENT_AMBULATORY_CARE_PROVIDER_SITE_OTHER): Payer: Medicare Other

## 2016-11-02 ENCOUNTER — Encounter: Payer: Self-pay | Admitting: Family Medicine

## 2016-11-02 ENCOUNTER — Ambulatory Visit (INDEPENDENT_AMBULATORY_CARE_PROVIDER_SITE_OTHER): Payer: Medicare Other | Admitting: Family Medicine

## 2016-11-02 VITALS — BP 138/72 | HR 69 | Temp 96.9°F | Ht 71.0 in | Wt 189.6 lb

## 2016-11-02 DIAGNOSIS — M25551 Pain in right hip: Secondary | ICD-10-CM

## 2016-11-02 DIAGNOSIS — M1611 Unilateral primary osteoarthritis, right hip: Secondary | ICD-10-CM

## 2016-11-02 DIAGNOSIS — L570 Actinic keratosis: Secondary | ICD-10-CM

## 2016-11-02 DIAGNOSIS — M169 Osteoarthritis of hip, unspecified: Secondary | ICD-10-CM | POA: Insufficient documentation

## 2016-11-02 NOTE — Progress Notes (Signed)
   HPI  Patient presents today with right hip pain and questions about skin lesions.  Hip pain Started about 2 weeks ago when he was getting up out of a recliner. He describes it as right groin pain, sometimes radiating to the right low back and right upper thigh. He has had no discrete injury. He has not had pain similar to this previously. He does have a history of foraminal stenosis of the lumbar spine and bulging disks, however these caused discretely different pain Lyrica, oxycodone, and Celebrex is helping minimally.  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 No new lesions that are obvious, no rapidly changing lesions  PMH: Smoking status noted ROS: Per HPI  Objective: BP 138/72   Pulse 69   Temp (!) 96.9 F (36.1 C) (Oral)   Ht 5\' 11"  (1.803 m)   Wt 189 lb 9.6 oz (86 kg)   BMI 26.44 kg/m  Gen: NAD, alert, cooperative with exam HEENT: NCAT CV: RRR, good S1/S2, no murmur Resp: CTABL, no wheezes, non-labored Ext: No edema, warm Neuro: Alert and oriented, No gross deficits, negative straight leg raise bilaterally MSK Negative Faber test of the right hip, no tenderness to palpation of bilateral paraspinal muscles in the lumbar spine, or midline lumbar spine Skin Right arm with one hyperkeratotic tiny papule consistent with actinic keratosis, 2-3 areas of slight pink to erythematous skin proximally 1 cm in diameter consistent with scars Right midforearm with small papule/subcutaneous nodule at the site of a previous cryotherapy, concerning for basal cell carcinoma   Assessment and plan:  # Hip pain- new problem Described as right groin pain with sharp intermittent severe painful episodes. Suspect underlying OA with flare X-ray today Consider orthopedic referral depending on x-ray results [facility]  # Actinic keratosis Right arm with one actinic keratosis present, however there are several peak lesions consistent with scars from previous cryotherapy, there is one lesion and concern for possible residual basal cell carcinoma Refer to dermatology  Orders Placed This Encounter  Procedures  . DG HIP UNILAT W OR W/O PELVIS 2-3 VIEWS RIGHT    Standing Status:   Future    Standing Expiration Date:   01/01/2018    Order Specific Question:   Reason for Exam (SYMPTOM  OR DIAGNOSIS REQUIRED)    Answer:   R hip pain, eval for OA    Order Specific Question:   Preferred imaging location?    Answer:   Internal  . Ambulatory referral to Dermatology    Referral Priority:   Routine    Referral Type:   Consultation    Referral Reason:   Specialty Services Required    Requested Specialty:   Dermatology    Number of Visits Requested:   1     Murtis SinkSam Bradshaw, MD [facility] 11/02/2016, 9:16 AM

## 2016-11-02 NOTE — Patient Instructions (Signed)
Great to see you!  We will let you know what the x ray shows within a few days, we will probably need to send you to an orthopedist.

## 2016-11-04 ENCOUNTER — Ambulatory Visit (INDEPENDENT_AMBULATORY_CARE_PROVIDER_SITE_OTHER): Payer: Medicare Other | Admitting: Family Medicine

## 2016-11-04 ENCOUNTER — Encounter: Payer: Self-pay | Admitting: Family Medicine

## 2016-11-04 VITALS — BP 125/64 | HR 81 | Temp 97.0°F | Resp 18 | Ht 71.0 in | Wt 188.0 lb

## 2016-11-04 DIAGNOSIS — R42 Dizziness and giddiness: Secondary | ICD-10-CM

## 2016-11-04 DIAGNOSIS — R29898 Other symptoms and signs involving the musculoskeletal system: Secondary | ICD-10-CM

## 2016-11-04 DIAGNOSIS — R5383 Other fatigue: Secondary | ICD-10-CM | POA: Diagnosis not present

## 2016-11-04 LAB — GLUCOSE HEMOCUE WAIVED: GLU HEMOCUE WAIVED: 98 mg/dL (ref 65–99)

## 2016-11-04 LAB — FINGERSTICK HEMOGLOBIN: Hemoglobin: 13.2 g/dL (ref 12.6–17.7)

## 2016-11-04 NOTE — Progress Notes (Signed)
Temp 97 F (36.1 C) (Oral)   Resp 18   Ht 5\' 11"  (1.803 m)   Wt 188 lb (85.3 kg)   SpO2 97%   BMI 26.22 kg/m    Subjective:    Patient ID: Michael Mcclure, male    DOB: [birth year], 82 y.o.   MRN: [id number]  HPI: Michael Mcclure is a 82 y.o. male presenting on 11/04/2016 for near syncopal episode (around 12:15, leg weakness, dizziness,pressure top of head, denies h/a, chest pain, SOB)   HPI Dizziness and presyncope Patient comes in today for an episode of dizziness and lightheadedness that nearly caused him to have a syncopal episode. This event occurred when he was at home at around 12:15 today. He was sitting in a chair and got up quickly to change his closed to go do something outside and felt lightheaded and dizzy and fell back into the chair. He denies any focal numbness or weakness or facial droop or chest pain or palpitations. He denies any abdominal pain or dysuria or diarrhea. He has not had any episodes of this previously. He also denies any shortness of breath or fevers or recent illnesses.  Relevant past medical, surgical, family and social history reviewed and updated as indicated. Interim medical history since our last visit reviewed. Allergies and medications reviewed and updated.  Review of Systems  Constitutional: Negative for chills and fever.  Respiratory: Negative for cough, chest tightness, shortness of breath and wheezing.   Cardiovascular: Negative for chest pain and leg swelling.  Gastrointestinal: Negative for abdominal pain, constipation, diarrhea, nausea and vomiting.  Genitourinary: Negative for dysuria and hematuria.  Musculoskeletal: Negative for back pain and gait problem.  Skin: Negative for color change and rash.  Neurological: Positive for dizziness and light-headedness. Negative for tremors, speech difficulty, weakness, numbness and headaches.  All other systems reviewed and are negative.   Per HPI unless specifically indicated above        Objective:    Temp 97 F (36.1 C) (Oral)   Resp 18   Ht 5\' 11"  (1.803 m)   Wt 188 lb (85.3 kg)   SpO2 97%   BMI 26.22 kg/m   Wt Readings from Last 3 Encounters:  11/04/16 188 lb (85.3 kg)  11/02/16 189 lb 9.6 oz (86 kg)  10/02/16 188 lb 12.8 oz (85.6 kg)   Vitals:   11/04/16 1328 11/04/16 1419 11/04/16 1423 11/04/16 1426  BP:  (!) 126/58 (!) 109/57 125/64  Pulse:  69 72 81  Resp: 18     Temp: 97 F (36.1 C)     TempSrc: Oral     SpO2: 97%     Weight: 188 lb (85.3 kg)     Height: 5\' 11"  (1.803 m)        Physical Exam  Constitutional: He is oriented to person, place, and time. He appears well-developed and well-nourished. No distress.  Eyes: Conjunctivae are normal. Right eye exhibits no discharge. Left eye exhibits no discharge. No scleral icterus.  Cardiovascular: Normal rate, regular rhythm, normal heart sounds and intact distal pulses.   No murmur heard. Pulmonary/Chest: Effort normal and breath sounds normal. No respiratory distress. He has no wheezes. He has no rales.  Abdominal: Soft. Bowel sounds are normal. He exhibits no distension. There is no tenderness. There is no rebound and no guarding.  Musculoskeletal: Normal range of motion. He exhibits no edema.  Neurological: He is alert and oriented to person, place, and time. Coordination normal.  Skin:  Skin is warm and dry. No rash noted. He is not diaphoretic.  Psychiatric: He has a normal mood and affect. His behavior is normal.  Nursing note and vitals reviewed.  Orthostatic blood pressures were normal.  EKG: First-degree block but otherwise normal sinus rhythm    Assessment & Plan:   Problem List Items Addressed This Visit      Other   Dizziness - Primary   Relevant Orders   Glucose Hemocue Waived   Fingerstick Hemoglobin   EKG 12-Lead (Completed)    Other Visit Diagnoses    Leg weakness, bilateral       Relevant Orders   Glucose Hemocue Waived   Fingerstick Hemoglobin   EKG 12-Lead (Completed)    Other fatigue       Relevant Orders   Glucose Hemocue Waived   Fingerstick Hemoglobin   EKG 12-Lead (Completed)       Follow up plan: Return if symptoms worsen or fail to improve.  Counseling provided for all of the vaccine components Orders Placed This Encounter  Procedures  . Glucose Hemocue Waived  . Fingerstick Hemoglobin  . EKG 12-Lead    Arville CareJoshua Dettinger, MD [facility] 11/04/2016, 2:20 PM

## 2016-11-19 ENCOUNTER — Other Ambulatory Visit: Payer: Self-pay | Admitting: Family Medicine

## 2016-11-23 ENCOUNTER — Telehealth: Payer: Self-pay | Admitting: Family Medicine

## 2016-11-26 MED ORDER — CELECOXIB 200 MG PO CAPS: 200.0000 mg | ORAL_CAPSULE | Freq: Two times a day (BID) | ORAL | 1 refills | Status: DC

## 2016-11-26 NOTE — Telephone Encounter (Signed)
I agree, this dose is pretty high, I have refilled it for a short time. He needs to com ein for f/u for discussion.   Murtis SinkSam Stepfanie Yott, MD [facility] 11/26/2016, 7:42 AM

## 2016-11-26 NOTE — Telephone Encounter (Signed)
Patient aware that he needs to be seen. 

## 2017-01-06 ENCOUNTER — Encounter: Payer: Self-pay | Admitting: Family Medicine

## 2017-01-06 ENCOUNTER — Ambulatory Visit (INDEPENDENT_AMBULATORY_CARE_PROVIDER_SITE_OTHER): Payer: Medicare Other | Admitting: Family Medicine

## 2017-01-06 ENCOUNTER — Ambulatory Visit (INDEPENDENT_AMBULATORY_CARE_PROVIDER_SITE_OTHER): Payer: Medicare Other

## 2017-01-06 VITALS — BP 119/63 | HR 73 | Temp 97.0°F | Ht 71.0 in | Wt 196.2 lb

## 2017-01-06 DIAGNOSIS — R21 Rash and other nonspecific skin eruption: Secondary | ICD-10-CM | POA: Diagnosis not present

## 2017-01-06 DIAGNOSIS — R05 Cough: Secondary | ICD-10-CM

## 2017-01-06 DIAGNOSIS — R079 Chest pain, unspecified: Secondary | ICD-10-CM

## 2017-01-06 DIAGNOSIS — M1990 Unspecified osteoarthritis, unspecified site: Secondary | ICD-10-CM

## 2017-01-06 DIAGNOSIS — R059 Cough, unspecified: Secondary | ICD-10-CM

## 2017-01-06 MED ORDER — CELECOXIB 100 MG PO CAPS: 100.0000 mg | ORAL_CAPSULE | Freq: Two times a day (BID) | ORAL | 3 refills | Status: DC | PRN

## 2017-01-06 MED ORDER — TRIAMCINOLONE ACETONIDE 0.5 % EX OINT: 1.0000 "application " | TOPICAL_OINTMENT | Freq: Two times a day (BID) | CUTANEOUS | 0 refills | Status: DC | PRN

## 2017-01-06 NOTE — Patient Instructions (Signed)
Great to see you!  Come back in 3-4 month sunless you need us sooner.    Nonspecific Chest Pain Chest pain can be caused by many different conditions. There is a chance that your pain could be related to something serious, such as a heart attack or a blood clot in your lungs. Chest pain can also be caused by conditions that are not life-threatening. If you have chest pain, it is very important to follow up with your doctor. Follow these instructions at home: Medicines  If you were prescribed an antibiotic medicine, take it as told by your doctor. Do not stop taking the antibiotic even if you start to feel better.  Take over-the-counter and prescription medicines only as told by your doctor. Lifestyle  Do not use any products that contain nicotine or tobacco, such as cigarettes and e-cigarettes. If you need help quitting, ask your doctor.  Do not drink alcohol.  Make lifestyle changes as told by your doctor. These may include:  Getting regular exercise. Ask your doctor for some activities that are safe for you.  Eating a heart-healthy diet. A diet specialist (dietitian) can help you to learn healthy eating options.  Staying at a healthy weight.  Managing diabetes, if needed.  Lowering your stress, as with deep breathing or spending time in nature. General instructions  Avoid any activities that make you feel chest pain.  If your chest pain is because of heartburn:  Raise (elevate) the head of your bed about 6 inches (15 cm). You can do this by putting blocks under the bed legs at the head of the bed.  Do not sleep with extra pillows under your head. That does not help heartburn.  Keep all follow-up visits as told by your doctor. This is important. This includes any further testing if your chest pain does not go away. Contact a doctor if:  Your chest pain does not go away.  You have a rash with blisters on your chest.  You have a fever.  You have chills. Get help right  away if:  Your chest pain is worse.  You have a cough that gets worse, or you cough up blood.  You have very bad (severe) pain in your belly (abdomen).  You are very weak.  You pass out (faint).  You have either of these for no clear reason:  Sudden chest discomfort.  Sudden discomfort in your arms, back, neck, or jaw.  You have shortness of breath at any time.  You suddenly start to sweat, or your skin gets clammy.  You feel sick to your stomach (nauseous).  You throw up (vomit).  You suddenly feel light-headed or dizzy.  Your heart starts to beat fast, or it feels like it is skipping beats. These symptoms may be an emergency. Do not wait to see if the symptoms will go away. Get medical help right away. Call your local emergency services (911 in the [place] Do not drive yourself to the hospital.  This information is not intended to replace advice given to you by your health care provider. Make sure you discuss any questions you have with your health care provider. Document Released: 05/11/2008 Document Revised: 08/17/2016 Document Reviewed: 08/17/2016 Elsevier Interactive Patient Education  2017 [employer]

## 2017-01-06 NOTE — Progress Notes (Signed)
HPI  Patient presents today here for chronic follow-up.  Patient complains of chest pain off and on for the last month. He describes bilateral but mostly left sided chest pain that sharp in nature lasting a few moments. This can be while standing or sitting. He denies any exertional symptoms or associated shortness of breath. He also denies any radiation of symptoms. He is on Plavix long-term for what is described as carotid artery stenosis.  He denies any history of heart disease.  Arthritis Taking celecoxib up to 400 mg daily, we've had many discussions about this and he is now reducing his dose to 1 tablet a day only as needed.  Cough Reports mild cough for the same duration. Denies any fever, chills, sweats, or shortness of breath.  States he had a cath about 3 years ago at [facility] with no problems- records requested  PMH: Smoking status noted ROS: Per HPI  Objective: BP 119/63   Pulse 73   Temp 97 F (36.1 C) (Oral)   Ht 5\' 11"  (1.803 m)   Wt 196 lb 3.2 oz (89 kg)   BMI 27.36 kg/m  Gen: NAD, alert, cooperative with exam HEENT: NCAT CV: RRR, good S1/S2, no murmur Chest wall: Tenderness to palpation of left-sided chest wall the sternocostal border, however does not reproduce pain Resp: CTABL, no wheezes, non-labored Ext: No edema, warm Neuro: Alert and oriented, No gross deficits Skin:  Three 0.5 to 1 cm slightly erythematous slightly raised skin nodules on R posterior leg, no induration, drainage, or fluctuance  EKG - NSR,  poor R wave progression   Assessment and plan:  # Chest pain Unlikely cardiac in etiology given atypical character, however given history of hyperlipidemia and age at think it's most prudent to be evaluated by cardiology as well. EKG shows normal sinus rhythm with poor R-wave progression which was not present on his last EKG Chest x-ray to evaluate cough and other etiology of chest pain as well Unlikely to be GERD given PPI  treatment, unlikely to be anxiety  # Arthritis Multiple joints, have had many discussions with patient about reducing his celecoxib dose. He is now on board to reduce to 100 twice daily only as needed, new Rx written Recommended reducing his lowest possible given possibility of renal stress as well as cardiovascular risk  # Cough Mild, evaluating the chest x-ray given chest pain. Most likely postnasal drip, supportive care  Rash Unclear etiology, has responded slowly tpo hydrocortisone, Given kenalog ointmnet   Orders Placed This Encounter  Procedures  . DG Chest 2 View    Standing Status:   Future    Number of Occurrences:   1    Standing Expiration Date:   03/06/2018    Order Specific Question:   Reason for Exam (SYMPTOM  OR DIAGNOSIS REQUIRED)    Answer:   Chest pain, cough    Order Specific Question:   Preferred imaging location?    Answer:   Internal  . Ambulatory referral to Cardiology    Referral Priority:   Routine    Referral Type:   Consultation    Referral Reason:   Specialty Services Required    Requested Specialty:   Cardiology    Number of Visits Requested:   1  . EKG 12-Lead    Meds ordered this encounter  Medications  . celecoxib (CELEBREX) 100 MG capsule    Sig: Take 1 capsule (100 mg total) by mouth 2 (two) times daily as needed  for moderate pain.    Dispense:  60 capsule    Refill:  3  . triamcinolone ointment (KENALOG) 0.5 %    Sig: Apply 1 application topically 2 (two) times daily as needed.    Dispense:  30 g    Refill:  0    Murtis SinkSam Majesti Gambrell, MD [facility] 01/06/2017, 12:01 PM

## 2017-01-19 ENCOUNTER — Ambulatory Visit (INDEPENDENT_AMBULATORY_CARE_PROVIDER_SITE_OTHER): Payer: PRIVATE HEALTH INSURANCE | Admitting: Pharmacist

## 2017-01-19 ENCOUNTER — Ambulatory Visit (INDEPENDENT_AMBULATORY_CARE_PROVIDER_SITE_OTHER): Payer: PRIVATE HEALTH INSURANCE

## 2017-01-19 ENCOUNTER — Encounter: Payer: Self-pay | Admitting: Pharmacist

## 2017-01-19 VITALS — BP 132/66 | HR 72 | Ht 71.0 in | Wt 193.0 lb

## 2017-01-19 DIAGNOSIS — M810 Age-related osteoporosis without current pathological fracture: Secondary | ICD-10-CM

## 2017-01-19 DIAGNOSIS — Z8781 Personal history of (healed) traumatic fracture: Secondary | ICD-10-CM

## 2017-01-19 DIAGNOSIS — Z0000 Encounter for general adult medical examination without abnormal findings: Secondary | ICD-10-CM | POA: Diagnosis not present

## 2017-01-19 MED ORDER — ALENDRONATE SODIUM 70 MG PO TABS: 70.0000 mg | ORAL_TABLET | ORAL | 11 refills | Status: DC

## 2017-01-19 NOTE — Patient Instructions (Addendum)
Michael Mcclure , Thank you for taking time to come for your Medicare Wellness Visit. I appreciate your ongoing commitment to your health goals. Please review the following plan we discussed and let me know if I can assist you in the future.   These are the goals we discussed:  Look for copy of Advanced Directives - Living Will and Healthcare Power of Attorney (important to know where these are kept - you can also bring copy to our office to be placed in our file / electronic chart)  Continue to walk or ride stationary bike daily - great job!   Increase non-starchy vegetables - carrots, green bean, squash, zucchini, tomatoes, onions, peppers, spinach and other green leafy vegetables, cabbage, lettuce, cucumbers, asparagus, okra (not fried), eggplant Limit sugar and processed foods (cakes, cookies, ice cream, crackers and chips) Increase fresh fruit but limit serving sizes 1/2 cup or about the size of tennis or baseball Limit red meat to no more than 1-2 times per week (serving size about the size of your palm) Choose whole grains / lean proteins - whole wheat bread, quinoa, whole grain rice (1/2 cup), fish, chicken, [place] Avoid sugar and calorie containing beverages - soda, sweet tea and juice.  Choose water or unsweetened tea instead.    This is a list of the screening recommended for you and due dates:  Health Maintenance  Topic Date Due  . Pneumonia vaccines (2 of 2 - PPSV23) 09/04/2016  . Tetanus Vaccine  11/25/2025  . Flu Shot  Completed  . Shingles Vaccine  Completed   Fall Prevention in the Home Falls can cause injuries and can affect people from all age groups. There are many simple things that you can do to make your home safe and to help prevent falls. What can I do on the outside of my home?  Regularly repair the edges of walkways and driveways and fix any cracks.  Remove high doorway thresholds.  Trim any shrubbery on the main path into your home.  Use bright outdoor  lighting.  Clear walkways of debris and clutter, including tools and rocks.  Regularly check that handrails are securely fastened and in good repair. Both sides of any steps should have handrails.  Install guardrails along the edges of any raised decks or porches.  Have leaves, snow, and ice cleared regularly.  Use sand or salt on walkways during winter months.  In the garage, clean up any spills right away, including grease or oil spills. What can I do in the bathroom?  Use night lights.  Install grab bars by the toilet and in the tub and shower. Do not use towel bars as grab bars.  Use non-skid mats or decals on the floor of the tub or shower.  If you need to sit down while you are in the shower, use a plastic, non-slip stool.  Keep the floor dry. Immediately clean up any water that spills on the floor.  Remove soap buildup in the tub or shower on a regular basis.  Attach bath mats securely with double-sided non-slip rug tape.  Remove throw rugs and other tripping hazards from the floor. What can I do in the bedroom?  Use night lights.  Make sure that a bedside light is easy to reach.  Do not use oversized bedding that drapes onto the floor.  Have a firm chair that has side arms to use for getting dressed.  Remove throw rugs and other tripping hazards from the floor. What can  I do in the kitchen?  Clean up any spills right away.  Avoid walking on wet floors.  Place frequently used items in easy-to-reach places.  If you need to reach for something above you, use a sturdy step stool that has a grab bar.  Keep electrical cables out of the way.  Do not use floor polish or wax that makes floors slippery. If you have to use wax, make sure that it is non-skid floor wax.  Remove throw rugs and other tripping hazards from the floor. What can I do in the stairways?  Do not leave any items on the stairs.  Make sure that there are handrails on both sides of the  stairs. Fix handrails that are broken or loose. Make sure that handrails are as long as the stairways.  Check any carpeting to make sure that it is firmly attached to the stairs. Fix any carpet that is loose or worn.  Avoid having throw rugs at the top or bottom of stairways, or secure the rugs with carpet tape to prevent them from moving.  Make sure that you have a light switch at the top of the stairs and the bottom of the stairs. If you do not have them, have them installed. What are some other fall prevention tips?  Wear closed-toe shoes that fit well and support your feet. Wear shoes that have rubber soles or low heels.  When you use a stepladder, make sure that it is completely opened and that the sides are firmly locked. Have someone hold the ladder while you are using it. Do not climb a closed stepladder.  Add color or contrast paint or tape to grab bars and handrails in your home. Place contrasting color strips on the first and last steps.  Use mobility aids as needed, such as canes, walkers, scooters, and crutches.  Turn on lights if it is dark. Replace any light bulbs that burn out.  Set up furniture so that there are clear paths. Keep the furniture in the same spot.  Fix any uneven floor surfaces.  Choose a carpet design that does not hide the edge of steps of a stairway.  Be aware of any and all pets.  Review your medicines with your healthcare provider. Some medicines can cause dizziness or changes in blood pressure, which increase your risk of falling. Talk with your health care provider about other ways that you can decrease your risk of falls. This may include working with a physical therapist or trainer to improve your strength, balance, and endurance. This information is not intended to replace advice given to you by your health care provider. Make sure you discuss any questions you have with your health care provider. Document Released: 11/13/2002 Document Revised:  04/21/2016 Document Reviewed: 12/28/2014 Elsevier Interactive Patient Education  2017 [employer].

## 2017-01-19 NOTE — Progress Notes (Signed)
Patient ID: Michael Mcclure, male   DOB: July 23, 1932, 83 y.o.   MRN: [id number]     Subjective:   Michael Mcclure is a 83 y.o. male who presents for an Initial Medicare Annual Wellness Visit.  Social History: Born/Raised: born and grew up in [place].  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 celecoxib (CELEBREX) 100 MG capsule Take 1 capsule (100 mg total) by mouth 2 (two) times daily as needed for moderate pain.  . Cholecalciferol (VITAMIN D) 2000 UNITS CAPS Take by mouth.  . citalopram (CELEXA) 20 MG tablet TAKE 1 TABLET (20 MG TOTAL) BY MOUTH DAILY.  Marland Kitchen clopidogrel (PLAVIX) 75 MG tablet TAKE 1 TABLET (75 MG TOTAL) BY MOUTH DAILY.  Marland Kitchen ezetimibe-simvastatin (VYTORIN) 10-40 MG tablet Take 1 tablet by mouth at bedtime.  . Flaxseed, Linseed, (FLAX SEED OIL) 1000 MG CAPS Take by mouth.  . levothyroxine (SYNTHROID, LEVOTHROID) 25 MCG tablet TAKE 1 TABLET (25 MCG TOTAL) BY MOUTH DAILY.  Marland Kitchen Menthol, Topical Analgesic, (BIOFREEZE EX) Apply 1 application topically daily as needed (pain).  Marland Kitchen oxyCODONE-acetaminophen (PERCOCET) 10-325 MG tablet Take 1 tablet by mouth every 4 (four) hours as needed for pain. (patient reports he only take about once WEEKLY)  . pantoprazole (PROTONIX) 40 MG tablet TAKE 1 TABLET (40 MG TOTAL) BY MOUTH DAILY.  Marland Kitchen pregabalin (LYRICA) 75 MG capsule Take 1 capsule (75 mg total) by mouth 2 (two) times daily.  . tamsulosin (FLOMAX) 0.4 MG CAPS capsule Take 1 capsule (0.4 mg total) by mouth daily.  Marland Kitchen triamcinolone ointment (KENALOG) 0.5 % Apply 1 application topically 2 (two) times  daily as needed.   No facility-administered encounter medications on file as of 01/19/2017.     Allergies (verified) Ciprofloxacin and Hydrocodone   History: Past Medical History:  Diagnosis Date  . Anxiety   . Arthritis   . BPH (benign prostatic hyperplasia)   . CAD (coronary artery disease)   . Cataract   . GERD (gastroesophageal reflux disease)   . Hemorrhoids   . HOH (hard of hearing)   . Hx of colonic polyps   . Hyperlipidemia   . Hypothyroidism   . Pelvic fracture (HCC)   . Thyroid disease    Past Surgical History:  Procedure Laterality Date  . BACK SURGERY    . CARPAL TUNNEL RELEASE Right    x2  . CATARACT EXTRACTION W/PHACO Right 05/18/2016   Procedure: CATARACT EXTRACTION PHACO AND INTRAOCULAR LENS PLACEMENT RIGHT EYE; CDE:  5.35;  Surgeon: Gemma Payor, MD;  Location: [facility];  Service: Ophthalmology;  Laterality: Right;  . ROTATOR CUFF REPAIR    . SHOULDER SURGERY Right    x 2   . WRIST SURGERY Left    fracture   Family History  Problem Relation Age of Onset  . Colon cancer Mother   . Colon polyps Mother     ?  . Diabetes Daughter   . Heart disease Father   . Emphysema Father     black lung / coal miner  . Heart disease Brother   . Diabetes Son   .  Emphysema Son     black lung  . Heart disease Brother    Social History   Occupational History  . Forensic scientistCoal Miner    Social History Main Topics  . Smoking status: Never Smoker  . Smokeless tobacco: Never Used  . Alcohol use No  . Drug use: No  . Sexual activity: No    Do you feel safe at home?  Yes Are there smokers in your home (other than you)? No  Dietary issues and exercise activities discussed: Current Exercise Habits: Home exercise routine, Type of exercise: walking;Other - see comments (stationary bike), Time (Minutes): 30, Frequency (Times/Week): 5, Weekly Exercise (Minutes/Week): 150, Intensity: Moderate  Current Dietary habits:  Eats 3 meals per day.  Lives with son and DIL and they prepare  most meals.  Eats lots of fruits and vegetables.  Reports " I eat whatever I want"    Cardiac Risk Factors include: advanced age (>1255men, 73>65 women);family history of premature cardiovascular disease;dyslipidemia;hypertension;male gender  Objective:    Today's Vitals   01/19/17 0957  BP: 132/66  Pulse: 72  Weight: 193 lb (87.5 kg)  Height: 5\' 11"  (1.803 m)  PainSc: 0-No pain   Body mass index is 26.92 kg/m.   DEXA - results from today L1-L3 = -1.3 Neck of left hip = -2.5   Activities of Daily Living In your present state of health, do you have any difficulty performing the following activities: 01/19/2017 05/12/2016  Hearing? Malvin JohnsY Y  Vision? N Y  Difficulty concentrating or making decisions? Y N  Walking or climbing stairs? N N  Dressing or bathing? N Y  Doing errands, shopping? N N  Preparing Food and eating ? N -  Using the Toilet? N -  In the past six months, have you accidently leaked urine? N -  Do you have problems with loss of bowel control? N -  Managing your Medications? N -  Managing your Finances? N -  Housekeeping or managing your Housekeeping? Y -  Some recent data might be hidden     Depression Screen PHQ 2/9 Scores 01/19/2017 01/06/2017 11/04/2016 11/02/2016  PHQ - 2 Score 0 0 0 0  PHQ- 9 Score - - - -     Fall Risk Fall Risk  01/19/2017 01/06/2017 11/04/2016 11/02/2016 10/02/2016  Falls in the past year? No No No No No  Number falls in past yr: - - - - -  Injury with Fall? - - - - -    Cognitive Function: MMSE - Mini Mental State Exam 01/19/2017  Orientation to time 5  Orientation to Place 5  Attention/ Calculation 5    Immunizations and Health Maintenance Immunization History  Administered Date(s) Administered  . Influenza Split 08/07/2010  . Influenza, High Dose Seasonal PF 10/02/2016  . Influenza,inj,Quad PF,36+ Mos 09/05/2015  . Pneumococcal Conjugate-13 09/05/2015  . Tdap 11/26/2015  . Zoster 11/26/2015   Health Maintenance Due  Topic  Date Due  . PNA vac Low Risk Adult (2 of 2 - PPSV23) 09/04/2016    Patient Care Team: Elenora GammaSamuel L Bradshaw, MD as PCP - General (Family Medicine) Trey SailorsMark Roy, MD as Consulting Physician (Neurosurgery) Peggye Pittavid O'Toole, MD as Consulting Physician (Pain Medicine) Rollene RotundaJames Hochrein, MD as Consulting Physician (Cardiology) Marcelino Dusterlarence Beavers, MD as Consulting Physician (Dermatology)  Indicate any recent Medical Services you may have received from other than [facility] providers in the past year (date may be approximate).    Assessment:    Annual Wellness Visit  Osteoporosis  with history of fractured pelvis   Screening Tests Health Maintenance  Topic Date Due  . PNA vac Low Risk Adult (2 of 2 - PPSV23) 09/04/2016  . TETANUS/TDAP  11/25/2025  . INFLUENZA VACCINE  Completed  . ZOSTAVAX  Completed        Plan:   During the course of the visit Mass[place] was educated and counseled about the following appropriate screening and preventive services:   Vaccines to include Pneumoccal, Influenza,  Td, Zostavax - No documentation of Pneumovax 23 but patient believes he has had this before - will review records  Colorectal cancer screening - UTD last 2013;  Family history of colon cancer but due to age and last colonoscopy being normal,  further screening was not recommended by GI.  Cardiovascular disease screening - seeing cardiologist in 2 weeks  Bone Denisty / Osteoporosis Screening - checking today due to history of pelvic fracture with fall on ice about 3 years ago  Start alendronate 70mg  take 1 tablet weekly - discussed how to take and possible side effects to monitor for.  Emphasized for him to call office if experience any worsening of GERD or esophageal pain.  Discussed fall prevention.  Recommended he use cane for ambulation daily  Glaucoma screening / Eye Exam - UTD; [facility] (Dr Laveda Norman)  Nutrition counseling - discussed including non strachy vegetables, fruits and whole grains in diet.  Recommended  choosing lean protein sources  Prostate cancer screening - no longer required due to age  Advanced Directives - copy requested  Physical Activity - continue to walk or ride stationary bike daily    Patient Instructions (the written plan) were given to the patient.   Henrene Pastor, PharmD   01/19/2017

## 2017-02-01 ENCOUNTER — Telehealth: Payer: Self-pay | Admitting: Family Medicine

## 2017-02-01 NOTE — Telephone Encounter (Signed)
Ok with letter to be off of plavix for 7 days prior to epidural injection, restart day after injection.    Murtis SinkSam Derriana Oser, MD [facility] 02/01/2017, 5:13 PM

## 2017-02-01 NOTE — Telephone Encounter (Signed)
I do not know why this came to me because you're here today. I will let she make the decision on this one because you may know her better

## 2017-02-02 NOTE — Telephone Encounter (Signed)
Patient aware and letter faxed

## 2017-02-04 ENCOUNTER — Other Ambulatory Visit: Payer: Self-pay | Admitting: Family Medicine

## 2017-02-23 NOTE — Progress Notes (Signed)
Cardiology Office Note   Date:  02/25/2017   ID:  Michael Mcclure, DOB 11/24/1932, MRN [id number]  PCP:  Kevin Fenton, MD  Cardiologist:   Rollene Rotunda, MD  Referring:  Kevin Fenton, MD  Chief Complaint  Patient presents with  . Chest Pain      History of Present Illness: Michael Mcclure is a 83 y.o. male who presents for evaluation of chest pain.  He has no past cardiac history. He did have a. Perfusion study in 2016 at [facility] and I was able to review these results. This was negative for any evidence of ischemia.rehead  .  He also was reports that there might have been some other cardiac workup in the past though I don't have evidence of what this would be.  He did have an echo in 2008 that was essentially normal.     He is referred for evaluation of chest discomfort. This is his left upper chest and seems to be with movement of his arm. He says it's been going on for years. He doesn't think it's changed from when he had his previous stress test. He denies any associated symptoms such as nausea vomiting or diaphoresis, radiation to his arm or neck. He's had no shortness of breath, PND or orthopnea. There've been no palpitations, presyncope or syncope. He is quite active doing walking and riding a stationary bicycle.   Past Medical History:  Diagnosis Date  . Actinic keratitis 2017  . Anxiety   . Arthritis   . BPH (benign prostatic hyperplasia)   . Cataract   . GERD (gastroesophageal reflux disease)   . Hemorrhoids   . HOH (hard of hearing)   . Hx of colonic polyps   . Hyperlipidemia   . Hypothyroidism   . Keratosis, seborrheic 2017  . Pelvic fracture (HCC)   . Thyroid disease     Past Surgical History:  Procedure Laterality Date  . BACK SURGERY    . CARPAL TUNNEL RELEASE Right    x2  . CATARACT EXTRACTION W/PHACO Right 05/18/2016   Procedure: CATARACT EXTRACTION PHACO AND INTRAOCULAR LENS PLACEMENT RIGHT EYE; CDE:  5.35;  Surgeon: Gemma Payor, MD;   Location: [facility];  Service: Ophthalmology;  Laterality: Right;  . ROTATOR CUFF REPAIR    . SHOULDER SURGERY Right    x 2   . WRIST SURGERY Left    fracture     Current Outpatient Prescriptions  Medication Sig Dispense Refill  . alendronate (FOSAMAX) 70 MG tablet Take 1 tablet (70 mg total) by mouth every 7 (seven) days. Take with a full glass of water on an empty stomach. 4 tablet 11  . celecoxib (CELEBREX) 100 MG capsule Take 1 capsule (100 mg total) by mouth 2 (two) times daily as needed for moderate pain. 60 capsule 3  . Cholecalciferol (VITAMIN D) 2000 UNITS CAPS Take by mouth.    . citalopram (CELEXA) 20 MG tablet TAKE 1 TABLET (20 MG TOTAL) BY MOUTH DAILY. 90 tablet 3  . clopidogrel (PLAVIX) 75 MG tablet TAKE 1 TABLET (75 MG TOTAL) BY MOUTH DAILY. 90 tablet 3  . ezetimibe-simvastatin (VYTORIN) 10-40 MG tablet Take 1 tablet by mouth at bedtime. 90 tablet 3  . Flaxseed, Linseed, (FLAX SEED OIL) 1000 MG CAPS Take by mouth.    . levothyroxine (SYNTHROID, LEVOTHROID) 25 MCG tablet TAKE 1 TABLET (25 MCG TOTAL) BY MOUTH DAILY. 90 tablet 3  . Menthol, Topical Analgesic, (BIOFREEZE EX) Apply 1 application topically daily  as needed (pain).    . pantoprazole (PROTONIX) 40 MG tablet TAKE 1 TABLET (40 MG TOTAL) BY MOUTH DAILY. 90 tablet 3  . pregabalin (LYRICA) 75 MG capsule Take 1 capsule (75 mg total) by mouth 2 (two) times daily. 60 capsule 2  . tamsulosin (FLOMAX) 0.4 MG CAPS capsule TAKE 1 CAPSULE (0.4 MG TOTAL) BY MOUTH DAILY. 90 capsule 0  . triamcinolone ointment (KENALOG) 0.5 % Apply 1 application topically 2 (two) times daily as needed. 30 g 0   No current facility-administered medications for this visit.     Allergies:   Ciprofloxacin and Hydrocodone    Social History:  The patient  reports that he has never smoked. He has never used smokeless tobacco. He reports that he does not drink alcohol or use drugs.   Family History:  The patient's family history includes Colon cancer in  his mother; Colon polyps in his mother; Diabetes in his daughter and son; Emphysema in his father and son; Heart disease in his brother, brother, and father.    ROS:  Please see the history of present illness.   Otherwise, review of systems are positive for Occasional dizziness, decreased hearing, reflux, leg pain from back problems, mild ankle edema.   All other systems are reviewed and negative.    PHYSICAL EXAM: VS:  BP 138/70   Pulse 80   Ht 5\' 11"  (1.803 m)   Wt 190 lb (86.2 kg)   BMI 26.50 kg/m  , BMI Body mass index is 26.5 kg/m. GENERAL:  Well appearing HEENT:  Pupils equal round and reactive, fundi not visualized, oral mucosa unremarkable NECK:  No jugular venous distention, waveform within normal limits, carotid upstroke brisk and symmetric, no bruits, no thyromegaly LYMPHATICS:  No cervical, inguinal adenopathy LUNGS:  Clear to auscultation bilaterally BACK:  No CVA tenderness CHEST:  Unremarkable HEART:  PMI not displaced or sustained,S1 and S2 within normal limits, no S3, no S4, no clicks, no rubs, no murmurs ABD:  Flat, positive bowel sounds normal in frequency in pitch, no bruits, no rebound, no guarding, no midline pulsatile mass, no hepatomegaly, no splenomegaly EXT:  2 plus pulses throughout, no edema, no cyanosis no clubbing SKIN:  No rashes no nodules NEURO:  Cranial nerves II through XII grossly intact, motor grossly intact throughout PSYCH:  Cognitively intact, oriented to person place and time    EKG:  EKG is not ordered today. The ekg ordered 01/06/17 demonstrates sinus rhythm, rate 70, axis within normal limits, intervals within normal limits, no acute ST-T wave changes.   Recent Labs: 05/11/2016: ALT 16; Platelets 150; TSH 2.340 10/02/2016: BUN 19; Creatinine, Ser 1.11; Potassium 4.1; Sodium 140    Lipid Panel    Component Value Date/Time   CHOL 143 05/11/2016 0826   CHOL 142 03/13/2013 0841   TRIG 75 05/11/2016 0826   TRIG 74 03/13/2013 0841   HDL  58 05/11/2016 0826   HDL 48 03/13/2013 0841   CHOLHDL 2.5 05/11/2016 0826   LDLCALC 70 05/11/2016 0826   LDLCALC 79 03/13/2013 0841      Wt Readings from Last 3 Encounters:  02/24/17 190 lb (86.2 kg)  01/19/17 193 lb (87.5 kg)  01/06/17 196 lb 3.2 oz (89 kg)      Other studies Reviewed: Additional studies/ records that were reviewed today include: Previous echo and [facility] results. Review of the above records demonstrates:  Please see elsewhere in the note.     ASSESSMENT AND PLAN:  CHEST  PAIN:  This is atypical.  He had a negative stress test 2 years ago as above. He would not be able to have a POET (Plain Old Exercise Treadmill) secondary to foot drop.  I don't think that repeat Lexiscan Myoview is indicated since the pretest probability of obstructive CAD is very low.  He will continue with risk reduction  HTN:  The blood pressure is at target. No change in medications is indicated. We will continue with therapeutic lifestyle changes (TLC).  DYSLIPIDEMIA:  His lipids were very good as above.  He will remain on the meds as listed.      Current medicines are reviewed at length with the patient today.  The patient does not have concerns regarding medicines.  The following changes have been made:  no change  Labs/ tests ordered today include: None No orders of the defined types were placed in this encounter.    Disposition:   FU with as needed    Signed, Rollene Rotunda, MD  02/25/2017 5:33 PM    [facility]

## 2017-02-24 ENCOUNTER — Ambulatory Visit (INDEPENDENT_AMBULATORY_CARE_PROVIDER_SITE_OTHER): Payer: Medicare (Managed Care) | Admitting: Cardiology

## 2017-02-24 ENCOUNTER — Encounter: Payer: Self-pay | Admitting: Cardiology

## 2017-02-24 VITALS — BP 138/70 | HR 80 | Ht 71.0 in | Wt 190.0 lb

## 2017-02-24 DIAGNOSIS — I10 Essential (primary) hypertension: Secondary | ICD-10-CM | POA: Diagnosis not present

## 2017-02-24 DIAGNOSIS — R072 Precordial pain: Secondary | ICD-10-CM

## 2017-02-24 NOTE — Patient Instructions (Signed)
Medication Instructions:  The current medical regimen is effective;  continue present plan and medications.  Follow-Up: Follow up as needed with Dr Hochrein.  Thank you for choosing [facility]!!     

## 2017-03-04 ENCOUNTER — Other Ambulatory Visit: Payer: Self-pay | Admitting: Family Medicine

## 2017-03-04 NOTE — Telephone Encounter (Signed)
Last filled 02/05/17, last seen 02/24/17. Route to pool for call in

## 2017-03-04 NOTE — Telephone Encounter (Signed)
[facility] called [facility] 

## 2017-03-15 ENCOUNTER — Other Ambulatory Visit: Payer: Self-pay | Admitting: Family Medicine

## 2017-04-06 ENCOUNTER — Ambulatory Visit: Payer: PRIVATE HEALTH INSURANCE | Admitting: Family Medicine

## 2017-04-07 ENCOUNTER — Encounter: Payer: Self-pay | Admitting: Pediatrics

## 2017-04-07 ENCOUNTER — Ambulatory Visit (INDEPENDENT_AMBULATORY_CARE_PROVIDER_SITE_OTHER): Payer: PRIVATE HEALTH INSURANCE | Admitting: Pediatrics

## 2017-04-07 VITALS — BP 130/75 | HR 65 | Temp 96.9°F | Ht 71.0 in | Wt 191.2 lb

## 2017-04-07 DIAGNOSIS — G629 Polyneuropathy, unspecified: Secondary | ICD-10-CM

## 2017-04-07 DIAGNOSIS — K219 Gastro-esophageal reflux disease without esophagitis: Secondary | ICD-10-CM

## 2017-04-07 DIAGNOSIS — E785 Hyperlipidemia, unspecified: Secondary | ICD-10-CM | POA: Diagnosis not present

## 2017-04-07 DIAGNOSIS — M810 Age-related osteoporosis without current pathological fracture: Secondary | ICD-10-CM | POA: Diagnosis not present

## 2017-04-07 DIAGNOSIS — E039 Hypothyroidism, unspecified: Secondary | ICD-10-CM

## 2017-04-07 DIAGNOSIS — R030 Elevated blood-pressure reading, without diagnosis of hypertension: Secondary | ICD-10-CM | POA: Diagnosis not present

## 2017-04-07 DIAGNOSIS — N400 Enlarged prostate without lower urinary tract symptoms: Secondary | ICD-10-CM

## 2017-04-07 DIAGNOSIS — F419 Anxiety disorder, unspecified: Secondary | ICD-10-CM

## 2017-04-07 DIAGNOSIS — R739 Hyperglycemia, unspecified: Secondary | ICD-10-CM

## 2017-04-07 DIAGNOSIS — M1611 Unilateral primary osteoarthritis, right hip: Secondary | ICD-10-CM

## 2017-04-07 LAB — BAYER DCA HB A1C WAIVED: HB A1C: 5.4 % (ref <7.0)

## 2017-04-07 MED ORDER — PANTOPRAZOLE SODIUM 40 MG PO TBEC: DELAYED_RELEASE_TABLET | ORAL | 1 refills | Status: DC

## 2017-04-07 MED ORDER — CELECOXIB 100 MG PO CAPS: 100.0000 mg | ORAL_CAPSULE | Freq: Two times a day (BID) | ORAL | 3 refills | Status: DC | PRN

## 2017-04-07 MED ORDER — PREGABALIN 75 MG PO CAPS: ORAL_CAPSULE | ORAL | 2 refills | Status: DC

## 2017-04-07 MED ORDER — CLOPIDOGREL BISULFATE 75 MG PO TABS: ORAL_TABLET | ORAL | 3 refills | Status: DC

## 2017-04-07 MED ORDER — EZETIMIBE-SIMVASTATIN 10-40 MG PO TABS: 1.0000 | ORAL_TABLET | Freq: Every day | ORAL | 1 refills | Status: DC

## 2017-04-07 MED ORDER — ALENDRONATE SODIUM 70 MG PO TABS: 70.0000 mg | ORAL_TABLET | ORAL | 11 refills | Status: DC

## 2017-04-07 MED ORDER — LEVOTHYROXINE SODIUM 25 MCG PO TABS: ORAL_TABLET | ORAL | 3 refills | Status: DC

## 2017-04-07 MED ORDER — CITALOPRAM HYDROBROMIDE 20 MG PO TABS: ORAL_TABLET | ORAL | 1 refills | Status: DC

## 2017-04-07 MED ORDER — TAMSULOSIN HCL 0.4 MG PO CAPS
0.4000 mg | ORAL_CAPSULE | Freq: Every day | ORAL | 1 refills | Status: DC
Start: 2017-04-07 — End: 2017-12-21

## 2017-04-07 NOTE — Progress Notes (Signed)
Subjective:   Patient ID: Michael Mcclure, male    DOB: 02-Mar-1932, 83 y.o.   MRN: [id number] CC: Follow-up (3 mos ckup)  HPI: Michael Mcclure is a 83 y.o. male presenting for Follow-up (3 mos ckup)  Here for chronic follow up  HTN: daughter in law checks at home, he doesn't know the numbers Says she says it is normal  No more chest pain, no SOB Walks regularly, 30-50 min most days  Osteoporosis: on fosamax  Osteoarthritis: in hands in back, celebrex helps Takes lyrica twice a day, thinks it has been helping about the same as the gabapentin  Anxiety: takes citalopram every, thinks it is helping  Artery disease: on plavix  HLD: takes med daily  Hypothyroidism: takes every day  GERD: says he takes ppi every day Doesn't have symptoms much, every once in a while  Relevant past medical, surgical, family and social history reviewed. Allergies and medications reviewed and updated. History  Smoking Status  . Never Smoker  Smokeless Tobacco  . Never Used   ROS: Per HPI   Objective:    BP (!) 149/78   Pulse 63   Temp (!) 96.9 F (36.1 C) (Oral)   Ht 5' 11" (1.803 m)   Wt 191 lb 3.2 oz (86.7 kg)   BMI 26.67 kg/m   Wt Readings from Last 3 Encounters:  04/07/17 191 lb 3.2 oz (86.7 kg)  02/24/17 190 lb (86.2 kg)  01/19/17 193 lb (87.5 kg)  improved to 130/75 BP with recheck   Gen: NAD, alert, cooperative with exam, NCAT EYES: EOMI, no conjunctival injection, or no icterus ENT:   OP without erythema NECK: no bruits LYMPH: no cervical LAD CV: NRRR, normal S1/S2, no murmur, distal pulses 2+ b/l Resp: CTABL, no wheezes, normal WOB Ext: trace pitting edema b/l LE, warm Neuro: Alert and oriented, strength equal b/l UE and LE, coordination grossly normal MSK: normal muscle bulk  Assessment & Plan:  Worley was seen today for follow-up.  Diagnoses and all orders for this visit:  Hyperglycemia No h/o diabetes, glucose 130 last visit -     Bayer DCA Hb A1c  Waived  Elevated blood pressure reading Improved with recheck Will cont to monitor at home -     BMP8+EGFR  Gastroesophageal reflux disease, esophagitis presence not specified With osteoporosis, taking med daily, has no symptoms now Discussed trying every other day, weaning as able, can take zantac instead  -     pantoprazole (PROTONIX) 40 MG tablet; TAKE 1 TABLET (40 MG TOTAL) BY MOUTH DAILY.  Hypothyroidism, unspecified type Cont med, check TSH -     levothyroxine (SYNTHROID, LEVOTHROID) 25 MCG tablet; TAKE 1 TABLET (25 MCG TOTAL) BY MOUTH DAILY. -     TSH  Neuropathy Symptoms stable, cont below, can try capsacin cream  -     pregabalin (LYRICA) 75 MG capsule; TAKE 1 CAPSULE BY MOUTH 2 TIMES DAILY  Age-related osteoporosis without current pathological fracture Stable, cont below, wean PPI as able as above -     alendronate (FOSAMAX) 70 MG tablet; Take 1 tablet (70 mg total) by mouth every 7 (seven) days. Take with a full glass of water on an empty stomach.  Primary osteoarthritis of right hip Stable, cont below -     celecoxib (CELEBREX) 100 MG capsule; Take 1 capsule (100 mg total) by mouth 2 (two) times daily as needed for moderate pain.  Benign prostatic hyperplasia, unspecified whether lower urinary tract symptoms present -  tamsulosin (FLOMAX) 0.4 MG CAPS capsule; Take 1 capsule (0.4 mg total) by mouth daily.  Hyperlipidemia, unspecified hyperlipidemia type Stable, cont meds -     ezetimibe-simvastatin (VYTORIN) 10-40 MG tablet; Take 1 tablet by mouth at bedtime.  Anxiety Stable, feels safe at home, cont below -     citalopram (CELEXA) 20 MG tablet; TAKE 1 TABLET (20 MG TOTAL) BY MOUTH DAILY.   Follow up plan: Return in about 3 months (around 07/08/2017) for follow up. with Dr Wendi Snipes Assunta Found, MD [place]

## 2017-04-08 LAB — BMP8+EGFR
BUN / CREAT RATIO: 14 (ref 10–24)
BUN: 20 mg/dL (ref 8–27)
CALCIUM: 9.4 mg/dL (ref 8.6–10.2)
CHLORIDE: 101 mmol/L (ref 96–106)
CO2: 25 mmol/L (ref 18–29)
Creatinine, Ser: 1.44 mg/dL — ABNORMAL HIGH (ref 0.76–1.27)
GFR calc Af Amer: 52 mL/min/{1.73_m2} — ABNORMAL LOW (ref >59)
GFR calc non Af Amer: 45 mL/min/{1.73_m2} — ABNORMAL LOW (ref >59)
Glucose: 88 mg/dL (ref 65–99)
POTASSIUM: 4.6 mmol/L (ref 3.5–5.2)
SODIUM: 138 mmol/L (ref 134–144)

## 2017-04-08 LAB — TSH: TSH: 3.42 u[IU]/mL (ref 0.450–4.500)

## 2017-04-14 ENCOUNTER — Telehealth: Payer: Self-pay | Admitting: Family Medicine

## 2017-04-14 NOTE — Telephone Encounter (Signed)
Lmtcb with person who answered the phone.

## 2017-04-15 NOTE — Telephone Encounter (Signed)
Patient aware of lab results.

## 2017-04-16 ENCOUNTER — Other Ambulatory Visit: Payer: Self-pay | Admitting: Family Medicine

## 2017-05-01 ENCOUNTER — Other Ambulatory Visit: Payer: Self-pay | Admitting: Family Medicine

## 2017-05-02 ENCOUNTER — Other Ambulatory Visit: Payer: Self-pay | Admitting: Family Medicine

## 2017-05-06 ENCOUNTER — Other Ambulatory Visit: Payer: Self-pay | Admitting: Family Medicine

## 2017-05-06 DIAGNOSIS — G629 Polyneuropathy, unspecified: Secondary | ICD-10-CM

## 2017-05-09 ENCOUNTER — Other Ambulatory Visit: Payer: Self-pay | Admitting: Family Medicine

## 2017-05-09 DIAGNOSIS — G629 Polyneuropathy, unspecified: Secondary | ICD-10-CM

## 2017-05-10 ENCOUNTER — Other Ambulatory Visit: Payer: Self-pay | Admitting: Family Medicine

## 2017-05-10 NOTE — Telephone Encounter (Signed)
Last filled 04/04/17, last seen by you on 01/06/17. Route to pool for call in

## 2017-05-10 NOTE — Telephone Encounter (Signed)
Pt notified that PA received today PA usually take 2-3 days Pt verbalizes understanding

## 2017-05-11 NOTE — Telephone Encounter (Signed)
[facility] called [facility] 

## 2017-06-18 ENCOUNTER — Encounter: Payer: Self-pay | Admitting: Family Medicine

## 2017-06-18 ENCOUNTER — Encounter: Payer: Self-pay | Admitting: Neurology

## 2017-06-18 ENCOUNTER — Ambulatory Visit (INDEPENDENT_AMBULATORY_CARE_PROVIDER_SITE_OTHER): Payer: PRIVATE HEALTH INSURANCE | Admitting: Family Medicine

## 2017-06-18 VITALS — BP 133/60 | HR 71 | Temp 97.6°F | Ht 71.0 in | Wt 185.8 lb

## 2017-06-18 DIAGNOSIS — R519 Headache, unspecified: Secondary | ICD-10-CM

## 2017-06-18 DIAGNOSIS — R51 Headache: Secondary | ICD-10-CM | POA: Diagnosis not present

## 2017-06-18 NOTE — Progress Notes (Signed)
   HPI  Patient presents today here with burning pain of the head  Patient reports 2-3 week history of burning pain, proximal two thirds of the day, on the crown of his head. He denies any fever, chills, sweats. He denies any bumps or concerning lesions. He reports a history of vertebral artery stenosis "years ago".  Patient denies any weakness, numbness, tingling. He denies any injury. He has not started any new medications.   PMH: Smoking status noted ROS: Per HPI  Objective: BP 133/60   Pulse 71   Temp 97.6 F (36.4 C) (Oral)   Ht 5\' 11"  (1.803 m)   Wt 185 lb 12.8 oz (84.3 kg)   BMI 25.91 kg/m  Gen: NAD, alert, cooperative with exam HEENT: NCAT, EOMI, PERRL CV: RRR, good S1/S2, no murmur Resp: CTABL, no wheezes, non-labored Ext: No edema, warm Neuro: Alert and oriented, cranial nerves II through XII intact, normal gait  Assessment and plan:  # Headache Unusual headache with paresthesia/neuropathic type pain on the crown of the head. Patient is concerned about vertebral artery stenosis being the cause. He does not have any signs or symptoms of stroke or TIA. Considering persistent headache with history of vertebral artery stenosis I referred him to neurology for their opinion. I considered MRA to evaluate vertebral arteries, however I cannot definitively say that this is clinically necessary. Appreciate their opinion.  No change in lyrica for now but this could be a good next step  Reviewed red flags for stroke and discussed reasons to seek emergency medical care.   Orders Placed This Encounter  Procedures  . Ambulatory referral to Neurology    Referral Priority:   Routine    Referral Type:   Consultation    Referral Reason:   Specialty Services Required    Requested Specialty:   Neurology    Number of Visits Requested:   1     Murtis SinkSam Bradshaw, MD [facility] 06/18/2017, 9:24 AM

## 2017-07-12 ENCOUNTER — Other Ambulatory Visit: Payer: Self-pay | Admitting: Family Medicine

## 2017-07-12 DIAGNOSIS — G629 Polyneuropathy, unspecified: Secondary | ICD-10-CM

## 2017-07-12 NOTE — Telephone Encounter (Signed)
PHONED IN.

## 2017-07-12 NOTE — Telephone Encounter (Signed)
Last seen 06/18/17  Dr Ermalinda MemosBradshaw  If approved [facility]

## 2017-08-11 ENCOUNTER — Ambulatory Visit (INDEPENDENT_AMBULATORY_CARE_PROVIDER_SITE_OTHER): Payer: PRIVATE HEALTH INSURANCE | Admitting: Family Medicine

## 2017-08-11 ENCOUNTER — Encounter: Payer: Self-pay | Admitting: Family Medicine

## 2017-08-11 ENCOUNTER — Ambulatory Visit (INDEPENDENT_AMBULATORY_CARE_PROVIDER_SITE_OTHER): Payer: PRIVATE HEALTH INSURANCE

## 2017-08-11 ENCOUNTER — Encounter (INDEPENDENT_AMBULATORY_CARE_PROVIDER_SITE_OTHER): Payer: Self-pay

## 2017-08-11 VITALS — BP 117/64 | HR 79 | Temp 97.3°F | Ht 71.0 in | Wt 184.2 lb

## 2017-08-11 DIAGNOSIS — N183 Chronic kidney disease, stage 3 unspecified: Secondary | ICD-10-CM

## 2017-08-11 DIAGNOSIS — M25551 Pain in right hip: Secondary | ICD-10-CM

## 2017-08-11 DIAGNOSIS — G629 Polyneuropathy, unspecified: Secondary | ICD-10-CM | POA: Diagnosis not present

## 2017-08-11 DIAGNOSIS — E785 Hyperlipidemia, unspecified: Secondary | ICD-10-CM | POA: Diagnosis not present

## 2017-08-11 MED ORDER — PREGABALIN 75 MG PO CAPS: 75.0000 mg | ORAL_CAPSULE | Freq: Two times a day (BID) | ORAL | 5 refills | Status: DC

## 2017-08-11 MED ORDER — METHYLPREDNISOLONE ACETATE 80 MG/ML IJ SUSP
80.0000 mg | Freq: Once | INTRAMUSCULAR | Status: AC
  Administered 2017-08-11: 80 mg via INTRAMUSCULAR

## 2017-08-11 NOTE — Progress Notes (Signed)
   HPI  Patient presents today here for follow-up of chronic medical conditions as well as right hip pain.  She reports good medication compliance and tolerance. He feels that Lyrica is helpful, he needs a refill. Patient reports 2 weeks of right hip pain, primarily in the right groin, patient states that pain at times starts in the right low back radiates around the right groin and down to the right anterior thigh. No injury. Patient states that this started without provocation.  He has a history of low back surgery a few years ago with Dr. Carloyn Manner. He uses oxycodone prescribed by Dr. Carloyn Manner He also uses Celebrex as well as Lyrica for pain. Patient has pain with bearing weight, walking. He does not trust stairs or steps.  Lyrica continues to help with neuropathy.  Hyperlipidemia  good medication compliance, no side effects   PMH: Smoking status noted ROS: Per HPI  Objective: BP 117/64   Pulse 79   Temp (!) 97.3 F (36.3 C) (Oral)   Ht '5\' 11"'$  (1.803 m)   Wt 184 lb 3.2 oz (83.6 kg)   BMI 25.69 kg/m  Gen: NAD, alert, cooperative with exam HEENT: NCAT CV: RRR, good S1/S2, no murmur Resp: CTABL, no wheezes, non-labored Ext: No edema, warm Neuro: Alert and oriented, No gross deficits  Plain film R hip- changes of OA on R hip, no acute  Assessment and plan:  # Right hip pain Most likely low back etiology, patient does have some OA of the right hip which could be contributing as well. I M Depo-Medrol given. If no improvement would recommend speaking to his neurosurgeon specifically about this pain  # Neuropathy Refill Lyrica, stable, may also be helpful for the above pain*  # Hyperlipidemia Nonfasting, labs today Clinically stable No changes  # Chronic kidney disease stage III Labs, limited NSAIDs    Orders Placed This Encounter  Procedures  . DG HIP UNILAT W OR W/O PELVIS 2-3 VIEWS RIGHT    Standing Status:   Future    Number of Occurrences:   1    Standing  Expiration Date:   08/11/2018    Order Specific Question:   Reason for Exam (SYMPTOM  OR DIAGNOSIS REQUIRED)    Answer:   R hip pain    Order Specific Question:   Preferred imaging location?    Answer:   [facility]  . CMP14+EGFR  . LDL Cholesterol, Direct  . CBC with Differential/Platelet    Meds ordered this encounter  Medications  . pregabalin (LYRICA) 75 MG capsule    Sig: Take 1 capsule (75 mg total) by mouth 2 (two) times daily.    Dispense:  60 capsule    Refill:  5  . methylPREDNISolone acetate (DEPO-MEDROL) injection 80 mg    Laroy Apple, MD [facility] 08/11/2017, 11:33 AM

## 2017-08-12 LAB — CMP14+EGFR
ALT: 15 IU/L (ref 0–44)
AST: 24 IU/L (ref 0–40)
Albumin/Globulin Ratio: 1.9 (ref 1.2–2.2)
Albumin: 4.6 g/dL (ref 3.5–4.7)
Alkaline Phosphatase: 61 IU/L (ref 39–117)
BILIRUBIN TOTAL: 0.7 mg/dL (ref 0.0–1.2)
BUN/Creatinine Ratio: 15 (ref 10–24)
BUN: 17 mg/dL (ref 8–27)
CALCIUM: 9.5 mg/dL (ref 8.6–10.2)
CHLORIDE: 98 mmol/L (ref 96–106)
CO2: 23 mmol/L (ref 20–29)
Creatinine, Ser: 1.17 mg/dL (ref 0.76–1.27)
GFR, EST AFRICAN AMERICAN: 66 mL/min/{1.73_m2} (ref >59)
GFR, EST NON AFRICAN AMERICAN: 57 mL/min/{1.73_m2} — AB (ref >59)
GLUCOSE: 95 mg/dL (ref 65–99)
Globulin, Total: 2.4 g/dL (ref 1.5–4.5)
POTASSIUM: 4.4 mmol/L (ref 3.5–5.2)
Sodium: 139 mmol/L (ref 134–144)
TOTAL PROTEIN: 7 g/dL (ref 6.0–8.5)

## 2017-08-12 LAB — CBC WITH DIFFERENTIAL/PLATELET
BASOS ABS: 0 10*3/uL (ref 0.0–0.2)
Basos: 1 %
EOS (ABSOLUTE): 0.2 10*3/uL (ref 0.0–0.4)
EOS: 4 %
HEMATOCRIT: 41 % (ref 37.5–51.0)
Hemoglobin: 13.9 g/dL (ref 13.0–17.7)
IMMATURE GRANULOCYTES: 0 %
Immature Grans (Abs): 0 10*3/uL (ref 0.0–0.1)
LYMPHS ABS: 1 10*3/uL (ref 0.7–3.1)
Lymphs: 20 %
MCH: 30.9 pg (ref 26.6–33.0)
MCHC: 33.9 g/dL (ref 31.5–35.7)
MCV: 91 fL (ref 79–97)
MONOS ABS: 0.5 10*3/uL (ref 0.1–0.9)
Monocytes: 10 %
NEUTROS PCT: 65 %
Neutrophils Absolute: 3.3 10*3/uL (ref 1.4–7.0)
PLATELETS: 157 10*3/uL (ref 150–379)
RBC: 4.5 x10E6/uL (ref 4.14–5.80)
RDW: 13.5 % (ref 12.3–15.4)
WBC: 4.9 10*3/uL (ref 3.4–10.8)

## 2017-08-12 LAB — LDL CHOLESTEROL, DIRECT: LDL Direct: 87 mg/dL (ref 0–99)

## 2017-08-26 ENCOUNTER — Other Ambulatory Visit: Payer: Self-pay | Admitting: Pediatrics

## 2017-08-26 DIAGNOSIS — K219 Gastro-esophageal reflux disease without esophagitis: Secondary | ICD-10-CM

## 2017-08-27 ENCOUNTER — Ambulatory Visit: Payer: PRIVATE HEALTH INSURANCE | Admitting: Nurse Practitioner

## 2017-08-27 ENCOUNTER — Encounter: Payer: Self-pay | Admitting: Family Medicine

## 2017-08-27 ENCOUNTER — Ambulatory Visit (INDEPENDENT_AMBULATORY_CARE_PROVIDER_SITE_OTHER): Payer: PRIVATE HEALTH INSURANCE | Admitting: Family Medicine

## 2017-08-27 VITALS — BP 117/67 | HR 75 | Temp 97.3°F | Ht 71.0 in | Wt 182.6 lb

## 2017-08-27 DIAGNOSIS — R1031 Right lower quadrant pain: Secondary | ICD-10-CM | POA: Diagnosis not present

## 2017-08-27 DIAGNOSIS — N451 Epididymitis: Secondary | ICD-10-CM | POA: Diagnosis not present

## 2017-08-27 DIAGNOSIS — R1011 Right upper quadrant pain: Secondary | ICD-10-CM

## 2017-08-27 LAB — URINALYSIS, COMPLETE
Bilirubin, UA: NEGATIVE
GLUCOSE, UA: NEGATIVE
Ketones, UA: NEGATIVE
Leukocytes, UA: NEGATIVE
NITRITE UA: NEGATIVE
PH UA: 6.5 (ref 5.0–7.5)
Protein, UA: NEGATIVE
RBC, UA: NEGATIVE
Specific Gravity, UA: 1.01 (ref 1.005–1.030)
Urobilinogen, Ur: 1 mg/dL (ref 0.2–1.0)

## 2017-08-27 LAB — MICROSCOPIC EXAMINATION
BACTERIA UA: NONE SEEN
Epithelial Cells (non renal): NONE SEEN /hpf (ref 0–10)
RBC, UA: NONE SEEN /hpf (ref 0–?)
Renal Epithel, UA: NONE SEEN /hpf
WBC, UA: NONE SEEN /hpf (ref 0–?)

## 2017-08-27 MED ORDER — AMOXICILLIN-POT CLAVULANATE 875-125 MG PO TABS: 1.0000 | ORAL_TABLET | Freq: Two times a day (BID) | ORAL | 0 refills | Status: DC

## 2017-08-27 NOTE — Progress Notes (Addendum)
HPI  Patient presents today here with dysuria, right groin pain, and right upper quadrant pain.  Quadrant pain is difficult to characterize, however he states that it's been there for several weeks. He does not clearly have any aggravating or alleviating factors.  Right groin pain Hurts with labor, he feels this may be associated with his right low back pain, however he is most concerned that it's actually UTI. He does have dysuria off and on. He states that he started Flomax wit[place]ent that is clear. He has a urologist "back home in [place]". He does not necessarily want to see a urologist yet.  He denies fever, chills, sweats. He has not been sexually active and 15 years. He does not have any discrete testicle pain today, although at times he does have right testicle pain and pain above the testicle to the groin. He does not appreciate any bulge in that area  PMH: Smoking status noted ROS: Per HPI  Objective: BP 117/67   Pulse 75   Temp (!) 97.3 F (36.3 C) (Oral)   Ht  (1.803 m)   Wt 182 lb 9.6 oz (82.8 kg)   BMI 25.47 kg/m  Gen: NAD, alert, cooperative with exam HEENT: NCAT CV: RRR, good S1/S2, no murmur Resp: CTABL, no wheezes, non-labored Abd: Soft, bowel sounds present, tenderness to palpation of the right upper quadrant, difficult to definitively report positive Murphy sign, some voluntary guarding isolated to the right upper quadrant Ext: No edema, warm Neuro: Alert and oriented, No gross deficits MSK Patient with right low back pain with Fadir and Faber test  GU Normal-appearing penis and scrotum, water or tenderness to palpation of the right epididymis  No clear right-sided bulge, patient does have tenderness around the R sided inguinal canal   Assessment and plan:  # Right groin pain Patient likely has epididymitis, however his symptoms are reminiscent of possible hernia with worsening with activity. Also considered right hip arthritis which  is possible, however his exam is not consistent with this. Ultrasound of the pelvis to evaluate for hernia  # Epididymitis Most likely etiology of right testicle, right groin pain, possible contribution intermittent dysuria Urinalysis is difficult to obtain today, he will return with it Treat with Augmentin- patient has a confluent allergy/intolerance  # Right upper quadrant pain Vague symptoms, however his exam is moderately impressive. Ultrasound of the abdomen limited to check out his gallbladder Recent labs were normal, he states that he was symptomatic at that time  He does have some right-sided low back pain with sciatica that is being currently evaluated by neurosurgery, he has an MRI pending with him currently. Gabapentin and oxycodone were prescribed by neurosurgery, only added here for accurate records   Orders Placed This Encounter  Procedures  . Urine Culture  . US Pelvis Limited    Standing Status:   Future    Standing Expiration Date:   10/27/2018    Order Specific Question:   Reason for Exam (SYMPTOM  OR DIAGNOSIS REQUIRED)    Answer:   R groin pain, eval for possible hernia    Order Specific Question:   Preferred imaging location?    Answer:   [facility]  . US Abdomen Limited RUQ    Standing Status:   Future    Standing Expiration Date:   10/27/2018    Order Specific Question:   Reason for Exam (SYMPTOM  OR DIAGNOSIS REQUIRED)    Answer:   RUQ pain, eval  for gallstones    Order Specific Question:   Preferred imaging location?    Answer:   [facility]  . Urinalysis, Complete    Meds ordered this encounter  Medications  . oxyCODONE-acetaminophen (PERCOCET/ROXICET) 5-325 MG tablet    Sig: Take 1 tablet by mouth every 6 (six) hours as needed. for pain    Refill:  0  . gabapentin (NEURONTIN) 800 MG tablet    Sig: Take 800 mg by mouth 2 (two) times daily.  Marland Kitchen amoxicillin-clavulanate (AUGMENTIN) 875-125 MG tablet    Sig: Take 1 tablet by mouth 2  (two) times daily.    Dispense:  20 tablet    Refill:  0    Murtis Sink, MD [place] 08/27/2017, 12:02 PM

## 2017-08-27 NOTE — Patient Instructions (Signed)
Great to see you!  We will work on some ultrasounds to evaluate the problems we talked about today.   Please let me know if you are not getting better and we can ask for an appointment with urology

## 2017-08-29 LAB — URINE CULTURE

## 2017-08-30 ENCOUNTER — Telehealth: Payer: Self-pay | Admitting: Family Medicine

## 2017-08-30 NOTE — Telephone Encounter (Signed)
Patient son aware of lab results

## 2017-09-02 ENCOUNTER — Ambulatory Visit (HOSPITAL_COMMUNITY): Payer: Medicare (Managed Care)

## 2017-09-02 ENCOUNTER — Ambulatory Visit (HOSPITAL_COMMUNITY): Admission: RE | Admit: 2017-09-02 | Payer: Medicare (Managed Care) | Source: Ambulatory Visit

## 2017-09-08 ENCOUNTER — Other Ambulatory Visit (HOSPITAL_COMMUNITY): Payer: Medicare (Managed Care)

## 2017-09-08 ENCOUNTER — Ambulatory Visit (HOSPITAL_COMMUNITY): Payer: Medicare (Managed Care)

## 2017-09-15 ENCOUNTER — Encounter: Payer: Self-pay | Admitting: Neurology

## 2017-09-15 ENCOUNTER — Ambulatory Visit (INDEPENDENT_AMBULATORY_CARE_PROVIDER_SITE_OTHER): Payer: Medicare (Managed Care) | Admitting: Neurology

## 2017-09-15 VITALS — BP 126/64 | HR 68 | Ht 71.0 in | Wt 180.9 lb

## 2017-09-15 DIAGNOSIS — M792 Neuralgia and neuritis, unspecified: Secondary | ICD-10-CM

## 2017-09-15 MED ORDER — PREGABALIN 100 MG PO CAPS: 100.0000 mg | ORAL_CAPSULE | Freq: Two times a day (BID) | ORAL | 2 refills | Status: DC

## 2017-09-15 NOTE — Progress Notes (Signed)
NEUROLOGY CONSULTATION NOTE  RITHWIK SCHMIEG MRN: [id number] DOB: 07/17/1832  Referring provider: Dr. Ermalinda Memos Primary care provider: Dr. Ermalinda Memos  Reason for consult:  Burning on the head  HISTORY OF PRESENT ILLNESS: Michael Mcclure is an 83 year old male who presents for burning sensation of head.  He is accompanied by his son who supplements history.  About 2 months ago, he started experiencing burning sensation on his scalp.  It is located on top of his head from front to parietal region, but not involving the occiput.  He only really notices it in the evening.  It occurs daily.  There is no associated neck pain, visual disturbance, involvement of face or unilateral numbness or weakness.  Nothing specifically triggers or exacerbates it.  Nothing specifically relieves it.  He had this once before several years ago and was evaluated by a neurologist.  He currently takes Lyrica  twice daily for chronic back pain.  He was switched from gabapentin  twice daily several months ago.  08/11/17 CMP:  Na 139, K 4.4, Cl 98, CO2 23, glucose 95, BUN 17, Cr 1.17, t. bili 0.7, ALP 61, AST 24 and ALT 15.  PAST MEDICAL HISTORY: Past Medical History:  Diagnosis Date  . Actinic keratitis 2017  . Anxiety   . Arthritis   . BPH (benign prostatic hyperplasia)   . Cataract   . GERD (gastroesophageal reflux disease)   . Hemorrhoids   . HOH (hard of hearing)   . Hx of colonic polyps   . Hyperlipidemia   . Hypothyroidism   . Keratosis, seborrheic 2017  . Pelvic fracture (HCC)   . Thyroid disease     PAST SURGICAL HISTORY: Past Surgical History:  Procedure Laterality Date  . BACK SURGERY    . CARPAL TUNNEL RELEASE Right    x2  . CATARACT EXTRACTION W/PHACO Right 05/18/2016   Procedure: CATARACT EXTRACTION PHACO AND INTRAOCULAR LENS PLACEMENT RIGHT EYE; CDE:  5.35;  Surgeon: Gemma Payor, MD;  Location: [facility];  Service: Ophthalmology;  Laterality: Right;  . ROTATOR CUFF REPAIR    .  SHOULDER SURGERY Right    x 2   . WRIST SURGERY Left    fracture    MEDICATIONS: Current Outpatient Prescriptions on File Prior to Visit  Medication Sig Dispense Refill  . alendronate (FOSAMAX) 70 MG tablet Take 1 tablet (70 mg total) by mouth every 7 (seven) days. Take with a full glass of water on an empty stomach. 4 tablet 11  . amoxicillin-clavulanate (AUGMENTIN) 875-125 MG tablet Take 1 tablet by mouth 2 (two) times daily. (Patient not taking: Reported on 09/15/2017) 20 tablet 0  . celecoxib (CELEBREX) 100 MG capsule Take 1 capsule (100 mg total) by mouth 2 (two) times daily as needed for moderate pain. 60 capsule 3  . Cholecalciferol (VITAMIN D) 2000 UNITS CAPS Take by mouth.    . citalopram (CELEXA) 20 MG tablet TAKE 1 TABLET (20 MG TOTAL) BY MOUTH DAILY. 90 tablet 1  . clopidogrel (PLAVIX) 75 MG tablet TAKE 1 TABLET (75 MG TOTAL) BY MOUTH DAILY. 90 tablet 3  . ezetimibe-simvastatin (VYTORIN) 10-40 MG tablet Take 1 tablet by mouth at bedtime. 90 tablet 1  . Flaxseed, Linseed, (FLAX SEED OIL) 1000 MG CAPS Take by mouth.    Marland Kitchen FLECTOR 1.3 % PTCH PLACE 1 PATCH ONTO THE SKIN 2 (TWO) TIMES DAILY. 60 patch 1  . gabapentin (NEURONTIN) 800 MG tablet Take 800 mg by mouth 2 (two) times daily.    Marland Kitchen  levothyroxine (SYNTHROID, LEVOTHROID) 25 MCG tablet TAKE 1 TABLET (25 MCG TOTAL) BY MOUTH DAILY. 90 tablet 3  . oxyCODONE-acetaminophen (PERCOCET/ROXICET) 5-325 MG tablet Take 1 tablet by mouth every 6 (six) hours as needed. for pain  0  . pantoprazole (PROTONIX) 40 MG tablet TAKE 1 TABLET BY MOUTH EVERY DAY 90 tablet 1  . tamsulosin (FLOMAX) 0.4 MG CAPS capsule Take 1 capsule (0.4 mg total) by mouth daily. 90 capsule 1  . triamcinolone ointment (KENALOG) 0.5 % APPLY 1 APPLICATION TOPICALLY 2 (TWO) TIMES DAILY AS NEEDED. (Patient not taking: Reported on 09/15/2017) 30 g 0   No current facility-administered medications on file prior to visit.     ALLERGIES: Allergies  Allergen Reactions  .  Ciprofloxacin     Question caused short of breath.  . Hydrocodone     Shortness of breath.    FAMILY HISTORY: Family History  Problem Relation Age of Onset  . Colon cancer Mother   . Colon polyps Mother        ?  . Diabetes Daughter   . Heart disease Father        No details  . Emphysema Father        black lung / coal miner  . Heart disease Brother        Died during bypass age about 57.  . Diabetes Son   . Emphysema Son        black lung  . Heart disease Brother        Unclear age of onset.  CAD.      SOCIAL HISTORY: Social History   Social History  . Marital status: Divorced    Spouse name: N/A  . Number of children: 4  . Years of education: N/A   Occupational History  . Forensic scientist    Social History Main Topics  . Smoking status: Never Smoker  . Smokeless tobacco: Never Used  . Alcohol use No  . Drug use: No  . Sexual activity: No   Other Topics Concern  . Not on file   Social History Narrative   Caffeine  Daily    Divorced, lives with son. They have one dog. Pt drinks one cup of coffee a day, 1-2 decaff sodas a week. Was previously getting regular exercise walking and riding stationary bicycle, however has had pain in his right groin area that has prevented him from this the last 2 months. I questioned if anyone had examined the groin area, he states his PCP did and said it was arthritis.    REVIEW OF SYSTEMS: Constitutional: No fevers, chills, or sweats, no generalized fatigue, change in appetite Eyes: No visual changes, double vision, eye pain Ear, nose and throat: No hearing loss, ear pain, nasal congestion, sore throat Cardiovascular: No chest pain, palpitations Respiratory:  No shortness of breath at rest or with exertion, wheezes GastrointestinaI: No nausea, vomiting, diarrhea, abdominal pain, fecal incontinence Genitourinary:  No dysuria, urinary retention or frequency Musculoskeletal:  No neck pain, back pain Integumentary: No rash, pruritus,  skin lesions Neurological: as above Psychiatric: No depression, insomnia, anxiety Endocrine: No palpitations, fatigue, diaphoresis, mood swings, change in appetite, change in weight, increased thirst Hematologic/Lymphatic:  No purpura, petechiae. Allergic/Immunologic: no itchy/runny eyes, nasal congestion, recent allergic reactions, rashes  PHYSICAL EXAM: Vitals:   09/15/17 1002  BP: 126/64  Pulse: 68  SpO2: 96%   General: No acute distress.  Patient appears well-groomed.  Head:  Normocephalic/atraumatic Eyes:  fundi examined but not visualized  Neck: supple, no paraspinal tenderness, full range of motion Back: No paraspinal tenderness Heart: regular rate and rhythm Lungs: Clear to auscultation bilaterally. Vascular: No carotid bruits. Neurological Exam: Mental status: alert and oriented to person, place, and time, recent and remote memory intact, fund of knowledge intact, attention and concentration intact, speech fluent and not dysarthric, language intact. Cranial nerves: CN I: not tested CN II: pupils equal, round and reactive to light, visual fields intact CN III, IV, VI:  full range of motion, no nystagmus, no ptosis CN V: facial sensation intact CN VII: upper and lower face symmetric CN VIII: hearing intact CN IX, X: gag intact, uvula midline CN XI: sternocleidomastoid and trapezius muscles intact CN XII: tongue midline Bulk & Tone: normal, no fasciculations. Motor:  5/5 throughout  Sensation: temperature and vibration sensation reduced in feet.. Deep Tendon Reflexes:  2+ throughout, toes downgoing. Finger to nose testing:  Without dysmetria.  Gait:  Flexed posture with wide-based gait and short stride.  Able to turn but not tandem walk. Romberg negative.  IMPRESSION: Neuralgia of the scalp.  Not likely occipital neuralgia as it does not involve the occipital region.  He has no other lateralizing symptoms to suggest an intracranial etiology involving the brain.  He had  this several years ago.  PLAN: 1.  We will increase Lyrica to  twice daily.  He will contact me in 4 weeks with update and we can either continue to increase dose or perhaps switch back to gabapentin. 2.  Follow up in 3 months.  Thank you for allowing me to take part in the care of this patient.  Shon Millet, DO  CC:  Kevin Fenton, MD

## 2017-09-15 NOTE — Patient Instructions (Addendum)
1.  I will change the Lyrica from  twice daily to  twice daily.  Contact me in 4 weeks with update and we can make adjustments if needed. 2.  Follow up in 3 months.

## 2017-09-24 ENCOUNTER — Ambulatory Visit: Payer: Medicare (Managed Care) | Admitting: Neurology

## 2017-10-07 ENCOUNTER — Other Ambulatory Visit: Payer: Self-pay | Admitting: Pediatrics

## 2017-10-07 DIAGNOSIS — F419 Anxiety disorder, unspecified: Secondary | ICD-10-CM

## 2017-10-08 ENCOUNTER — Ambulatory Visit (INDEPENDENT_AMBULATORY_CARE_PROVIDER_SITE_OTHER): Payer: PRIVATE HEALTH INSURANCE | Admitting: *Deleted

## 2017-10-08 DIAGNOSIS — Z23 Encounter for immunization: Secondary | ICD-10-CM | POA: Diagnosis not present

## 2017-10-18 ENCOUNTER — Telehealth: Payer: Self-pay | Admitting: Neurology

## 2017-10-18 NOTE — Telephone Encounter (Signed)
Patient called regarding his Lyrica medication. He said he was calling in to let Dr. Everlena CooperJaffe know that the Lyrica is helping. Thanks

## 2017-10-19 NOTE — Telephone Encounter (Signed)
FYI

## 2017-10-20 ENCOUNTER — Other Ambulatory Visit: Payer: Self-pay | Admitting: Family Medicine

## 2017-10-20 ENCOUNTER — Other Ambulatory Visit: Payer: Self-pay | Admitting: Neurosurgery

## 2017-10-20 DIAGNOSIS — M5126 Other intervertebral disc displacement, lumbar region: Secondary | ICD-10-CM

## 2017-11-01 ENCOUNTER — Telehealth: Payer: Self-pay | Admitting: Family Medicine

## 2017-11-01 NOTE — Telephone Encounter (Signed)
Form has been signed and filled out.    Murtis SinkSam Bradshaw, MD [facility] 11/01/2017, 12:26 PM

## 2017-11-09 ENCOUNTER — Ambulatory Visit
Admission: RE | Admit: 2017-11-09 | Discharge: 2017-11-09 | Disposition: A | Discharge status: Home / Self Care | Payer: Medicare (Managed Care) | Source: Ambulatory Visit | Attending: Neurosurgery | Admitting: Neurosurgery

## 2017-11-09 ENCOUNTER — Other Ambulatory Visit: Payer: Self-pay | Admitting: Neurosurgery

## 2017-11-09 DIAGNOSIS — M5126 Other intervertebral disc displacement, lumbar region: Secondary | ICD-10-CM

## 2017-11-09 MED ORDER — METHYLPREDNISOLONE ACETATE 40 MG/ML INJ SUSP (RADIOLOG
120.0000 mg | Freq: Once | INTRAMUSCULAR | Status: AC
  Administered 2017-11-09: 120 mg via EPIDURAL

## 2017-11-09 MED ORDER — IOPAMIDOL (ISOVUE-M 200) INJECTION 41%
1.0000 mL | Freq: Once | INTRAMUSCULAR | Status: AC
  Administered 2017-11-09: 1 mL via EPIDURAL

## 2017-11-09 NOTE — Discharge Instructions (Signed)

## 2017-12-10 ENCOUNTER — Ambulatory Visit: Payer: PRIVATE HEALTH INSURANCE | Admitting: Family Medicine

## 2017-12-10 ENCOUNTER — Encounter: Payer: Self-pay | Admitting: Family Medicine

## 2017-12-10 VITALS — BP 107/59 | HR 79 | Temp 97.6°F | Ht 71.0 in | Wt 191.0 lb

## 2017-12-10 DIAGNOSIS — G8929 Other chronic pain: Secondary | ICD-10-CM | POA: Diagnosis not present

## 2017-12-10 DIAGNOSIS — F419 Anxiety disorder, unspecified: Secondary | ICD-10-CM

## 2017-12-10 DIAGNOSIS — M549 Dorsalgia, unspecified: Secondary | ICD-10-CM

## 2017-12-10 DIAGNOSIS — G629 Polyneuropathy, unspecified: Secondary | ICD-10-CM

## 2017-12-10 LAB — CMP14+EGFR
A/G RATIO: 2 (ref 1.2–2.2)
ALBUMIN: 4.3 g/dL (ref 3.5–4.7)
ALK PHOS: 57 IU/L (ref 39–117)
ALT: 22 IU/L (ref 0–44)
AST: 27 IU/L (ref 0–40)
BUN / CREAT RATIO: 11 (ref 10–24)
BUN: 14 mg/dL (ref 8–27)
Bilirubin Total: 0.7 mg/dL (ref 0.0–1.2)
CHLORIDE: 99 mmol/L (ref 96–106)
CO2: 21 mmol/L (ref 20–29)
Calcium: 9.4 mg/dL (ref 8.6–10.2)
Creatinine, Ser: 1.25 mg/dL (ref 0.76–1.27)
GFR calc non Af Amer: 53 mL/min/{1.73_m2} — ABNORMAL LOW (ref >59)
GFR, EST AFRICAN AMERICAN: 61 mL/min/{1.73_m2} (ref >59)
GLUCOSE: 76 mg/dL (ref 65–99)
Globulin, Total: 2.1 g/dL (ref 1.5–4.5)
POTASSIUM: 4.2 mmol/L (ref 3.5–5.2)
Sodium: 138 mmol/L (ref 134–144)
TOTAL PROTEIN: 6.4 g/dL (ref 6.0–8.5)

## 2017-12-10 MED ORDER — CITALOPRAM HYDROBROMIDE 20 MG PO TABS: 20.0000 mg | ORAL_TABLET | Freq: Every day | ORAL | 3 refills | Status: DC

## 2017-12-10 NOTE — Progress Notes (Signed)
   HPI  Patient presents today for follow-up chronic medical conditions.  Patient states that his back pain is much improved after an injection by neurosurgery recently.  He states however he has had some giving way of his right leg, however he has had an unusual sensation. He states that it feels like the water starts at his medial proximal right thigh and runs down the medial portion of his leg, he feels around and there is no water there. He is still getting good pain relief from Celebrex, Lyrica  He is not experiencing any more pain.  His anxiety is helped well by citalopram.  PMH: Smoking status noted ROS: Per HPI  Objective: BP (!) 107/59   Pulse 79   Temp 97.6 F (36.4 C) (Oral)   Ht '5\' 11"'$  (1.803 m)   Wt 191 lb (86.6 kg)   BMI 26.64 kg/m  Gen: NAD, alert, cooperative with exam HEENT: NCAT CV: RRR, good S1/S2, no murmur Resp: CTABL, no wheezes, non-labored Ext: No edema, warm Neuro: Alert and oriented, No gross deficits  Assessment and plan:  #Chronic midline back pain Patient improved much after his injection by neurosurgery (or interventional radiology) Continue Lyrica, we have discussed multiple times reducing NSAIDs  #Anxiety Helped well by citalopram. Refill*  #Neuropathy Patient now with sensation of water running down his right leg intermittently, this is likely a neuropathy, we discussed no intervention for now, thankfully there is no pain or annoyance from the sensation If becomes persistent or bothersome we will plan to follow-up with neurosurgery   Orders Placed This Encounter  Procedures  . CMP14+EGFR    Meds ordered this encounter  Medications  . citalopram (CELEXA) 20 MG tablet    Sig: Take 1 tablet (20 mg total) by mouth daily.    Dispense:  90 tablet    Refill:  [place], MD [facility] 12/10/2017, 8:57 AM

## 2017-12-10 NOTE — Patient Instructions (Signed)
Great to see you!  Come back in 4 months unless you need us sooner.    

## 2017-12-12 ENCOUNTER — Other Ambulatory Visit: Payer: Self-pay | Admitting: Pediatrics

## 2017-12-12 DIAGNOSIS — E785 Hyperlipidemia, unspecified: Secondary | ICD-10-CM

## 2017-12-13 ENCOUNTER — Other Ambulatory Visit: Payer: Self-pay | Admitting: Pediatrics

## 2017-12-13 DIAGNOSIS — K219 Gastro-esophageal reflux disease without esophagitis: Secondary | ICD-10-CM

## 2017-12-14 ENCOUNTER — Other Ambulatory Visit: Payer: Self-pay | Admitting: Pediatrics

## 2017-12-16 ENCOUNTER — Ambulatory Visit: Payer: Medicare (Managed Care) | Admitting: Neurology

## 2017-12-20 ENCOUNTER — Ambulatory Visit (INDEPENDENT_AMBULATORY_CARE_PROVIDER_SITE_OTHER): Payer: PRIVATE HEALTH INSURANCE

## 2017-12-20 ENCOUNTER — Encounter: Payer: Self-pay | Admitting: Physician Assistant

## 2017-12-20 ENCOUNTER — Ambulatory Visit: Payer: PRIVATE HEALTH INSURANCE | Admitting: Physician Assistant

## 2017-12-20 VITALS — BP 154/79 | HR 77 | Temp 97.1°F | Ht 71.0 in | Wt 188.0 lb

## 2017-12-20 DIAGNOSIS — M25572 Pain in left ankle and joints of left foot: Secondary | ICD-10-CM

## 2017-12-20 DIAGNOSIS — W19XXXA Unspecified fall, initial encounter: Secondary | ICD-10-CM

## 2017-12-20 DIAGNOSIS — M25532 Pain in left wrist: Secondary | ICD-10-CM

## 2017-12-20 MED ORDER — MELOXICAM 7.5 MG PO TABS: 7.5000 mg | ORAL_TABLET | Freq: Every day | ORAL | 0 refills | Status: DC

## 2017-12-20 NOTE — Patient Instructions (Signed)
Ankle Sprain  An ankle sprain is a stretch or tear in one of the tough tissues (ligaments) in your ankle.  Follow these instructions at home:   Rest your ankle.   Take over-the-counter and prescription medicines only as told by your doctor.   For 2-3 days, keep your ankle higher than the level of your heart (elevated) as much as possible.   If directed, put ice on the area:  ? Put ice in a plastic bag.  ? Place a towel between your skin and the bag.  ? Leave the ice on for 20 minutes, 2-3 times a day.   If you were given a brace:  ? Wear it as told.  ? Take it off to shower or bathe.  ? Try not to move your ankle much, but wiggle your toes from time to time. This helps to prevent swelling.   If you were given an elastic bandage (dressing):  ? Take it off when you shower or bathe.  ? Try not to move your ankle much, but wiggle your toes from time to time. This helps to prevent swelling.  ? Adjust the bandage to make it more comfortable if it feels too tight.  ? Loosen the bandage if you lose feeling in your foot, your foot tingles, or your foot gets cold and blue.   If you have crutches, use them as told by your doctor. Continue to use them until you can walk without feeling pain in your ankle.  Contact a doctor if:   Your bruises or swelling are quickly getting worse.   Your pain does not get better after you take medicine.  Get help right away if:   You cannot feel your toes or foot.   Your toes or your foot looks blue.   You have very bad pain that gets worse.  This information is not intended to replace advice given to you by your health care provider. Make sure you discuss any questions you have with your health care provider.  Document Released: 05/11/2008 Document Revised: 04/30/2016 Document Reviewed: 06/25/2015  [employer]vier Interactive Patient Education  2018 [employer]

## 2017-12-20 NOTE — Progress Notes (Signed)
BP (!) 154/79 (BP Location: Left Arm)   Pulse 77   Temp (!) 97.1 F (36.2 C) (Oral)   Ht '5\' 11"'$  (1.803 m)   Wt 188 lb (85.3 kg)   BMI 26.22 kg/m    Subjective:    Patient ID: Michael Mcclure, male    DOB: 06-Feb-1932, 84 y.o.   MRN: [id number]  HPI: Michael Mcclure is a 84 y.o. male presenting on 12/20/2017 for Fall (left ankle and left wrist pain )  He is accompanied by his daughter. He fell on the steps and landed on the left wrist and lateral left ankle. His granddaughter got him up and elevated his leg and put ice on it.  They have come into the office after just a short period of time. He has severe OA throughout his body and is currently on celebrex.  He asked if there is another medication for arthritis.  Let him know the really is not anything much different than what he is currently taking.  We may try Mobic 7.5 mg 1 daily to see if this gives him a little more relief in the acute phase of this injury.  His last BUN and creatinine were normal.  The x-rays were performed today and appear to have no fractures.  But there is evidence of significant arthritis.  Relevant past medical, surgical, family and social history reviewed and updated as indicated. Allergies and medications reviewed and updated.  Past Medical History:  Diagnosis Date  . Actinic keratitis 2017  . Anxiety   . Arthritis   . BPH (benign prostatic hyperplasia)   . Cataract   . GERD (gastroesophageal reflux disease)   . Hemorrhoids   . HOH (hard of hearing)   . Hx of colonic polyps   . Hyperlipidemia   . Hypothyroidism   . Keratosis, seborrheic 2017  . Pelvic fracture ([place])   . Thyroid disease     Past Surgical History:  Procedure Laterality Date  . BACK SURGERY    . CARPAL TUNNEL RELEASE Right    x2  . CATARACT EXTRACTION W/PHACO Right 05/18/2016   Procedure: CATARACT EXTRACTION PHACO AND INTRAOCULAR LENS PLACEMENT RIGHT EYE; CDE:  5.35;  Surgeon: Tonny Branch, MD;  Location: [facility];  Service:  Ophthalmology;  Laterality: Right;  . ROTATOR CUFF REPAIR    . SHOULDER SURGERY Right    x 2   . WRIST SURGERY Left    fracture    Review of Systems  Constitutional: Negative.  Negative for appetite change and fatigue.  HENT: Negative.   Eyes: Negative.  Negative for pain and visual disturbance.  Respiratory: Negative.  Negative for cough, chest tightness, shortness of breath and wheezing.   Cardiovascular: Negative.  Negative for chest pain, palpitations and leg swelling.  Gastrointestinal: Negative.  Negative for abdominal pain, diarrhea, nausea and vomiting.  Endocrine: Negative.   Genitourinary: Negative.   Musculoskeletal: Positive for arthralgias, back pain, gait problem and joint swelling.  Skin: Negative.  Negative for color change and rash.  Neurological: Negative for weakness, numbness and headaches.  Psychiatric/Behavioral: Negative.     Allergies as of 12/20/2017      Reactions   Ciprofloxacin    Question caused short of breath.   Hydrocodone    Shortness of breath.      Medication List        Accurate as of 12/20/17  1:52 PM. Always use your most recent med list.  alendronate 70 MG tablet Commonly known as:  FOSAMAX Take 1 tablet (70 mg total) by mouth every 7 (seven) days. Take with a full glass of water on an empty stomach.   citalopram 20 MG tablet Commonly known as:  CELEXA Take 1 tablet (20 mg total) by mouth daily.   clopidogrel 75 MG tablet Commonly known as:  PLAVIX TAKE 1 TABLET (75 MG TOTAL) BY MOUTH DAILY.   ezetimibe-simvastatin 10-40 MG tablet Commonly known as:  VYTORIN TAKE 1 TABLET BY MOUTH EVERYDAY AT BEDTIME   Flax Seed Oil 1000 MG Caps Take by mouth.   FLECTOR 1.3 % Ptch Generic drug:  diclofenac PLACE 1 PATCH ONTO THE SKIN 2 (TWO) TIMES DAILY.   levothyroxine 25 MCG tablet Commonly known as:  SYNTHROID, LEVOTHROID TAKE 1 TABLET (25 MCG TOTAL) BY MOUTH DAILY.   meloxicam 7.5 MG tablet Commonly known as:   MOBIC Take 1 tablet (7.5 mg total) by mouth daily.   multivitamin with minerals tablet Take 1 tablet by mouth daily.   oxyCODONE-acetaminophen 5-325 MG tablet Commonly known as:  PERCOCET/ROXICET Take 1 tablet by mouth every 6 (six) hours as needed. for pain   pantoprazole 40 MG tablet Commonly known as:  PROTONIX TAKE 1 TABLET BY MOUTH EVERY DAY   pregabalin 100 MG capsule Commonly known as:  LYRICA Take 1 capsule (100 mg total) by mouth 2 (two) times daily.   tamsulosin 0.4 MG Caps capsule Commonly known as:  FLOMAX Take 1 capsule (0.4 mg total) by mouth daily.   triamcinolone ointment 0.5 % Commonly known as:  KENALOG APPLY 1 APPLICATION TOPICALLY 2 (TWO) TIMES DAILY AS NEEDED.   Vitamin D 2000 units Caps Take by mouth.          Objective:    BP (!) 154/79 (BP Location: Left Arm)   Pulse 77   Temp (!) 97.1 F (36.2 C) (Oral)   Ht '5\' 11"'$  (1.803 m)   Wt 188 lb (85.3 kg)   BMI 26.22 kg/m   Allergies  Allergen Reactions  . Ciprofloxacin     Question caused short of breath.  . Hydrocodone     Shortness of breath.    Physical Exam  Constitutional: He appears well-developed and well-nourished. No distress.  HENT:  Head: Normocephalic and atraumatic.  Eyes: Conjunctivae and EOM are normal. Pupils are equal, round, and reactive to light.  Cardiovascular: Normal rate, regular rhythm and normal heart sounds.  Pulmonary/Chest: Effort normal and breath sounds normal. No respiratory distress.  Musculoskeletal:       Left hand: He exhibits tenderness and swelling. He exhibits normal range of motion and no deformity. Normal sensation noted.       Hands:      Left foot: There is tenderness and swelling. There is normal range of motion and no deformity.       Feet:  Able to weightbear   Skin: Skin is warm and dry.  Psychiatric: He has a normal mood and affect. His behavior is normal.  Nursing note and vitals reviewed.   Results for orders placed or performed in  visit on 12/10/17  CMP14+EGFR  Result Value Ref Range   Glucose 76 65 - 99 mg/dL   BUN 14 8 - 27 mg/dL   Creatinine, Ser 1.25 0.76 - 1.27 mg/dL   GFR calc non Af Amer 53 (L) >59 mL/min/1.73   GFR calc Af Amer 61 >59 mL/min/1.73   BUN/Creatinine Ratio 11 10 - 24   Sodium 138  134 - 144 mmol/L   Potassium 4.2 3.5 - 5.2 mmol/L   Chloride 99 96 - 106 mmol/L   CO2 21 20 - 29 mmol/L   Calcium 9.4 8.6 - 10.2 mg/dL   Total Protein 6.4 6.0 - 8.5 g/dL   Albumin 4.3 3.5 - 4.7 g/dL   Globulin, Total 2.1 1.5 - 4.5 g/dL   Albumin/Globulin Ratio 2.0 1.2 - 2.2   Bilirubin Total 0.7 0.0 - 1.2 mg/dL   Alkaline Phosphatase 57 39 - 117 IU/L   AST 27 0 - 40 IU/L   ALT 22 0 - 44 IU/L      Assessment & Plan:   1. Left wrist pain - DG Wrist Complete Left; Future - meloxicam (MOBIC) 7.5 MG tablet; Take 1 tablet (7.5 mg total) by mouth daily.  Dispense: 30 tablet; Refill: 0  2. Acute left ankle pain - DG Ankle Complete Left; Future  3. Fall, initial encounter - DG Ankle Complete Left; Future - DG Wrist Complete Left; Future  Elevate injured wrist and ankle, continue ice packs for the next 2 days, compression Ace bandages are placed.  Current Outpatient Medications:  .  alendronate (FOSAMAX) 70 MG tablet, Take 1 tablet (70 mg total) by mouth every 7 (seven) days. Take with a full glass of water on an empty stomach., Disp: 4 tablet, Rfl: 11 .  Cholecalciferol (VITAMIN D) 2000 UNITS CAPS, Take by mouth., Disp: , Rfl:  .  citalopram (CELEXA) 20 MG tablet, Take 1 tablet (20 mg total) by mouth daily., Disp: 90 tablet, Rfl: 3 .  clopidogrel (PLAVIX) 75 MG tablet, TAKE 1 TABLET (75 MG TOTAL) BY MOUTH DAILY., Disp: 90 tablet, Rfl: 3 .  ezetimibe-simvastatin (VYTORIN) 10-40 MG tablet, TAKE 1 TABLET BY MOUTH EVERYDAY AT BEDTIME, Disp: 90 tablet, Rfl: 0 .  Flaxseed, Linseed, (FLAX SEED OIL) 1000 MG CAPS, Take by mouth., Disp: , Rfl:  .  FLECTOR 1.3 % PTCH, PLACE 1 PATCH ONTO THE SKIN 2 (TWO) TIMES DAILY.,  Disp: 60 patch, Rfl: 0 .  levothyroxine (SYNTHROID, LEVOTHROID) 25 MCG tablet, TAKE 1 TABLET (25 MCG TOTAL) BY MOUTH DAILY., Disp: 90 tablet, Rfl: 3 .  meloxicam (MOBIC) 7.5 MG tablet, Take 1 tablet (7.5 mg total) by mouth daily., Disp: 30 tablet, Rfl: 0 .  Multiple Vitamins-Minerals (MULTIVITAMIN WITH MINERALS) tablet, Take 1 tablet by mouth daily., Disp: , Rfl:  .  oxyCODONE-acetaminophen (PERCOCET/ROXICET) 5-325 MG tablet, Take 1 tablet by mouth every 6 (six) hours as needed. for pain, Disp: , Rfl: 0 .  pantoprazole (PROTONIX) 40 MG tablet, TAKE 1 TABLET BY MOUTH EVERY DAY, Disp: 90 tablet, Rfl: 1 .  pregabalin (LYRICA) 100 MG capsule, Take 1 capsule (100 mg total) by mouth 2 (two) times daily., Disp: 60 capsule, Rfl: 2 .  tamsulosin (FLOMAX) 0.4 MG CAPS capsule, Take 1 capsule (0.4 mg total) by mouth daily., Disp: 90 capsule, Rfl: 1 .  triamcinolone ointment (KENALOG) 0.5 %, APPLY 1 APPLICATION TOPICALLY 2 (TWO) TIMES DAILY AS NEEDED., Disp: 30 g, Rfl: 0 Continue all other maintenance medications as listed above.  Follow up plan: No Follow-up on file.  Educational handout given for [place] PA-C [place] [address]  [place], [place] [address] [phone]   12/20/2017, 1:52 PM

## 2017-12-21 ENCOUNTER — Other Ambulatory Visit: Payer: Self-pay | Admitting: Pediatrics

## 2017-12-21 DIAGNOSIS — N400 Enlarged prostate without lower urinary tract symptoms: Secondary | ICD-10-CM

## 2017-12-23 ENCOUNTER — Other Ambulatory Visit: Payer: Self-pay | Admitting: Family Medicine

## 2018-01-06 ENCOUNTER — Other Ambulatory Visit: Payer: Self-pay | Admitting: Nurse Practitioner

## 2018-01-06 DIAGNOSIS — M47816 Spondylosis without myelopathy or radiculopathy, lumbar region: Secondary | ICD-10-CM

## 2018-01-14 ENCOUNTER — Other Ambulatory Visit: Payer: Self-pay | Admitting: Neurology

## 2018-01-21 ENCOUNTER — Ambulatory Visit
Admission: RE | Admit: 2018-01-21 | Discharge: 2018-01-21 | Disposition: A | Discharge status: Home / Self Care | Payer: Medicare (Managed Care) | Source: Ambulatory Visit | Attending: Nurse Practitioner | Admitting: Nurse Practitioner

## 2018-01-21 DIAGNOSIS — M47816 Spondylosis without myelopathy or radiculopathy, lumbar region: Secondary | ICD-10-CM

## 2018-01-21 MED ORDER — ONDANSETRON HCL 4 MG/2ML IJ SOLN
4.0000 mg | Freq: Once | INTRAMUSCULAR | Status: AC
  Administered 2018-01-21: 4 mg via INTRAMUSCULAR

## 2018-01-21 MED ORDER — IOPAMIDOL (ISOVUE-M 200) INJECTION 41%
1.0000 mL | Freq: Once | INTRAMUSCULAR | Status: AC
  Administered 2018-01-21: 1 mL via EPIDURAL

## 2018-01-21 MED ORDER — METHYLPREDNISOLONE ACETATE 40 MG/ML INJ SUSP (RADIOLOG
120.0000 mg | Freq: Once | INTRAMUSCULAR | Status: AC
  Administered 2018-01-21: 120 mg via EPIDURAL

## 2018-01-21 MED ORDER — MEPERIDINE HCL 100 MG/ML IJ SOLN
75.0000 mg | Freq: Once | INTRAMUSCULAR | Status: AC
  Administered 2018-01-21: 50 mg via INTRAMUSCULAR

## 2018-01-21 NOTE — Discharge Instructions (Signed)
Spinal Injection Discharge Instruction Sheet ° °1. You may resume a regular diet and any medications that you routinely take, including pain medications. ° °2. No driving the rest of the day of the procedure. ° °3. Light activity throughout the rest of the day.  Do not do any strenuous work, exercise, bending or lifting.  The day following the procedure, you may resume normal physical activity but you should refrain from exercising or physical therapy for at least three days. ° ° °Common Side Effects: ° °· Headaches- take your usual medications as directed by your physician.   ° °· Restlessness or inability to sleep- you may have trouble sleeping for the next few days.  Ask your referring physician if you need any medication for sleep if over the counter sleep medications do not help. ° °· Facial flushing or redness- this should subside within a few days. ° °· Increased pain- a temporary increase in pain a day or two following your procedure is not unusual.  Take your pain medication as prescribed by your referring physician.  You may use ice to the injection site as needed.  Please do not use heat for 24 hours. ° °· Leg cramps ° °Please contact our office at [phone] for the following symptoms: °· Fever greater than 100 degrees. °· Headaches unresolved with medication after 2-3 days. °· Increased swelling, pain, or redness at injection site. ° °Thank you for visiting our office. ° ° °You may resume Plavix today. °

## 2018-01-24 ENCOUNTER — Ambulatory Visit (INDEPENDENT_AMBULATORY_CARE_PROVIDER_SITE_OTHER): Payer: PRIVATE HEALTH INSURANCE | Admitting: *Deleted

## 2018-01-24 ENCOUNTER — Encounter: Payer: Self-pay | Admitting: *Deleted

## 2018-01-24 VITALS — BP 156/72 | HR 77 | Ht 69.0 in | Wt 184.0 lb

## 2018-01-24 DIAGNOSIS — Z0000 Encounter for general adult medical examination without abnormal findings: Secondary | ICD-10-CM | POA: Diagnosis not present

## 2018-01-24 DIAGNOSIS — Z23 Encounter for immunization: Secondary | ICD-10-CM | POA: Diagnosis not present

## 2018-01-24 NOTE — Progress Notes (Signed)
Subjective:   Michael Mcclure is a 84 y.o. male who presents for a subsequent Medicare Annual Wellness Visit. Michael Mcclure is originally from [place] and moved here around 15 years ago to be closer to his children and grandchildren. He lives with his son, daughter-in-law and 2 grandchildren. He has two adult sons and two adult daughters. 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 He enjoys walking and was riding a stationary bike and walking a lot until his back/leg pain pain started in Aug 2018.   Review of Systems  Health is about the same as last year  Cardiac Risk Factors include: advanced age (>59men, >70 women);sedentary lifestyle;dyslipidemia;hypertension;male gender   MS: Significant lower back pain that radiates into right leg and has a major impact on his daily activities. Has seen Dr Channing Mutters and was referred for epidural steroid injections. Has received one and believes it is helping some.   Other symptoms negative   Objective:    Today's Vitals   01/24/18 0841 01/24/18 0845  BP: (!) 156/72   Pulse: 77   Weight: 184 lb (83.5 kg)   Height: 5\' 9"  (1.753 m)   PainSc:  10-Worst pain ever   Body mass index is 27.17 kg/m.  Advanced Directives 01/24/2018 01/19/2017 05/18/2016 05/12/2016  Does Patient Have a Medical Advance Directive? Yes Yes Yes Yes  Type of Sales promotion account executive of [employer] Power of [place];Living will Healthcare Power of [place];Living will  Does patient want to make changes to medical advance directive? - No - Patient declined No - Patient declined -  Copy of Healthcare Power of Attorney in Chart? No - copy requested No - copy requested No - copy requested No - copy requested    Current Medications (verified) Outpatient Encounter Medications as of 01/24/2018  Medication Sig  . alendronate (FOSAMAX) 70 MG tablet Take 1 tablet (70 mg total) by mouth every 7 (seven) days.  Take with a full glass of water on an empty stomach.  . Cholecalciferol (VITAMIN D) 2000 UNITS CAPS Take by mouth.  . citalopram (CELEXA) 20 MG tablet Take 1 tablet (20 mg total) by mouth daily.  . clopidogrel (PLAVIX) 75 MG tablet TAKE 1 TABLET (75 MG TOTAL) BY MOUTH DAILY.  Marland Kitchen ezetimibe-simvastatin (VYTORIN) 10-40 MG tablet TAKE 1 TABLET BY MOUTH EVERYDAY AT BEDTIME  . Flaxseed, Linseed, (FLAX SEED OIL) 1000 MG CAPS Take by mouth.  Marland Kitchen FLECTOR 1.3 % PTCH PLACE 1 PATCH ONTO THE SKIN 2 (TWO) TIMES DAILY.  Marland Kitchen levothyroxine (SYNTHROID, LEVOTHROID) 25 MCG tablet TAKE 1 TABLET (25 MCG TOTAL) BY MOUTH DAILY.  Marland Kitchen LYRICA 100 MG capsule TAKE 1 CAPSULE BY MOUTH TWICE A DAY  . meloxicam (MOBIC) 7.5 MG tablet Take 1 tablet (7.5 mg total) by mouth daily.  . Multiple Vitamins-Minerals (MULTIVITAMIN WITH MINERALS) tablet Take 1 tablet by mouth daily.  Marland Kitchen oxyCODONE-acetaminophen (PERCOCET/ROXICET) 5-325 MG tablet Take 1 tablet by mouth every 6 (six) hours as needed. for pain  . pantoprazole (PROTONIX) 40 MG tablet TAKE 1 TABLET BY MOUTH EVERY DAY  . tamsulosin (FLOMAX) 0.4 MG CAPS capsule TAKE 1 CAPSULE BY MOUTH EVERY DAY  . triamcinolone ointment (KENALOG) 0.5 % APPLY 1 APPLICATION TOPICALLY 2 (TWO) TIMES DAILY AS NEEDED.   No facility-administered encounter medications on file as of 01/24/2018.     Allergies (verified) Hydrocodone and Ciprofloxacin   History: Past Medical History:  Diagnosis Date  .  Actinic keratitis 2017  . Anxiety   . Arthritis   . BPH (benign prostatic hyperplasia)   . Cataract   . GERD (gastroesophageal reflux disease)   . Hemorrhoids   . HOH (hard of hearing)   . Hx of colonic polyps   . Hyperlipidemia   . Hypothyroidism   . Keratosis, seborrheic 2017  . Pelvic fracture ([facility])   . Thyroid disease    Past Surgical History:  Procedure Laterality Date  . BACK SURGERY    . CARPAL TUNNEL RELEASE Right    x2  . CATARACT EXTRACTION W/PHACO Right 05/18/2016   Procedure: CATARACT  EXTRACTION PHACO AND INTRAOCULAR LENS PLACEMENT RIGHT EYE; CDE:  5.35;  Surgeon: Gemma Payor, MD;  Location: [facility];  Service: Ophthalmology;  Laterality: Right;  . ROTATOR CUFF REPAIR    . SHOULDER SURGERY Right    x 2   . WRIST SURGERY Left    fracture   Family History  Problem Relation Age of Onset  . Colon cancer Mother   . Colon polyps Mother        ?  . Diabetes Daughter   . Heart disease Father        No details  . Emphysema Father        black lung / coal miner  . Heart disease Brother        Died during bypass age about 53.  . Diabetes Son   . Emphysema Son        black lung  . Heart disease Brother        Unclear age of onset.  CAD.     Social History   Socioeconomic History  . Marital status: Divorced    Spouse name: None  . Number of children: 4  . Years of education: 8  . Highest education level: 8th grade  Social Needs  . Financial resource strain: Not hard at all  . Food insecurity - worry: Never true  . Food insecurity - inability: Never true  . Transportation needs - medical: No  . Transportation needs - non-medical: No  Occupational History  . Occupation: Forensic scientist    Comment: Retired  Tobacco Use  . Smoking status: Never Smoker  . Smokeless tobacco: Never Used  Substance and Sexual Activity  . Alcohol use: No  . Drug use: No  . Sexual activity: No    Birth control/protection: None  Other Topics Concern  . None  Social History Narrative   Caffeine  Daily    Divorced, lives with son. They have one dog. Pt drinks one cup of coffee a day, 1-2 decaff sodas a week. Was previously getting regular exercise walking and riding stationary bicycle, however has had pain in his right groin area that has prevented him from this the last 2 months. I questioned if anyone had examined the groin area, he states his PCP did and said it was arthritis.   Tobacco Counseling Counseling given: Not Answered   Clinical Intake:     Pain : 0-10 Pain Score:  10-Worst pain ever Pain Type: Chronic pain Pain Location: Other (Comment)(back, right hip, and right thigh) Pain Orientation: Right Pain Descriptors / Indicators: Aching Pain Onset: More than a month ago Pain Frequency: Constant Pain Relieving Factors: ice, pain medication, steroid injection Effect of Pain on Daily Activities: significant. Unable to walk like he would like  Pain Relieving Factors: ice, pain medication, steroid injection     How often do you need to  have someone help you when you read instructions, pamphlets, or other written materials from your doctor or pharmacy?: 1 - Never What is the last grade level you completed in school?: 8th  Interpreter Needed?: No  Information entered by :: Demetrios Loll, RN  Activities of Daily Living In your present state of health, do you have any difficulty performing the following activities: 01/24/2018  Hearing? N  Vision? N  Comment seen yearly at [facility]  Difficulty concentrating or making decisions? Y  Comment Has noticed some trouble with recall from the past. Not as much trouble with short term but he has noticed he may forge where he puts things  Walking or climbing stairs? Y  Comment due back/hip/leg pain  Dressing or bathing? N  Doing errands, shopping? N  Preparing Food and eating ? N  Using the Toilet? N  In the past six months, have you accidently leaked urine? N  Do you have problems with loss of bowel control? N  Managing your Medications? N  Comment keeps them organized in a box  Managing your Finances? N  Housekeeping or managing your Housekeeping? N  Some recent data might be hidden     Immunizations and Health Maintenance Immunization History  Administered Date(s) Administered  . Influenza Split 08/07/2010  . Influenza, High Dose Seasonal PF 10/02/2016, 10/08/2017  . Influenza,inj,Quad PF,6+ Mos 09/05/2015  . Pneumococcal Conjugate-13 09/05/2015  . Pneumococcal Polysaccharide-23 01/24/2018  . Tdap  11/26/2015  . Zoster 11/26/2015   There are no preventive care reminders to display for this patient.  Patient Care Team: [name], MD as PCP - General (Family Medicine) Trey Sailors, MD as Consulting Physician (Neurosurgery) Peggye Pitt, MD as Consulting Physician (Pain Medicine) Rollene Rotunda, MD as Consulting Physician (Cardiology) Marcelino Duster, MD as Consulting Physician (Dermatology)  No hospitalizations, ER visits, or surgeries this past year.   Assessment:   This is a routine wellness examination for Solace.  Hearing/Vision screen Patient wears hearing aids. No problems with communication during visit.   Dietary issues and exercise activities discussed: Current Exercise Habits: The patient does not participate in regular exercise at present, Exercise limited by: orthopedic condition(s);neurologic condition(s)(Patient was walking and riding a stationary bike until about 6 months ago when he injured his back resulting in right leg pain)  Goals    . Exercise 150 min/wk Moderate Activity      Depression Screen PHQ 2/9 Scores 01/24/2018 12/20/2017 12/10/2017 08/27/2017  PHQ - 2 Score 0 0 0 0  PHQ- 9 Score - - - -    Fall Risk Fall Risk  01/24/2018 12/20/2017 12/10/2017 09/15/2017 08/27/2017  Falls in the past year? Yes Yes No No No  Comment Fell backwards going up steps - - - -  Number falls in past yr: 1 1 - - -  Injury with Fall? Yes Yes - - -  Comment No fractures - - - -  Risk for fall due to : Impaired balance/gait - - - -  Follow up Falls prevention discussed - - - -    Is the patient's home free of loose throw rugs in walkways, pet beds, electrical cords, etc?   yes      Grab bars in the bathroom? no      Handrails on the stairs?   yes      Adequate lighting?   yes    Cognitive Function: MMSE - Mini Mental State Exam 01/24/2018 01/19/2017  Orientation to time 5 5  Orientation to Place 5 5  Registration 3 -  Attention/ Calculation 5 5  Recall 1 -    Language- name 2 objects 2 -  Language- repeat 0 -  Language- follow 3 step command 3 -  Language- read & follow direction 1 -  Write a sentence 1 -  Copy design 1 -  Total score 27 -      Screening Tests Health Maintenance  Topic Date Due  . DEXA SCAN  01/19/2019  . TETANUS/TDAP  11/25/2025  . INFLUENZA VACCINE  Completed  . PNA vac Low Risk Adult  Completed      Plan:  Pneumovax given today Remain as active as possible. Aim for 150 minutes of moderate activity a week. Move carefully to avoid falls Keep f/u with PCP   I have personally reviewed and noted the following in the patient's chart:   . Medical and social history . Use of alcohol, tobacco or illicit drugs  . Current medications and supplements . Functional ability and status . Nutritional status . Physical activity . Advanced directives . List of other physicians . Hospitalizations, surgeries, and ER visits in previous 12 months . Vitals . Screenings to include cognitive, depression, and falls . Referrals and appointments  In addition, I have reviewed and discussed with patient certain preventive protocols, quality metrics, and best practice recommendations. A written personalized care plan for preventive services as well as general preventive health recommendations were provided to patient.     Demetrios LollKristen Jabin Tapp, RN   01/25/2018   I have reviewed and agree with the above AWV documentation.   Remus LofflerAngel S. Jones PA-C [facility] [address]  [place], [place] [address] [phone]

## 2018-01-24 NOTE — Patient Instructions (Addendum)
Michael Mcclure , Thank you for taking time to come for your Medicare Wellness Visit. I appreciate your ongoing commitment to your health goals. Please review the following plan we discussed and let me know if I can assist you in the future.   These are the goals we discussed: Goals    . Exercise 150 min/wk Moderate Activity       This is a list of the screening recommended for you and due dates:  Health Maintenance  Topic Date Due  . DEXA scan (bone density measurement)  01/19/2019  . Tetanus Vaccine  11/25/2025  . Flu Shot  Completed  . Pneumonia vaccines  Completed    Pneumococcal Vaccine, Polyvalent solution for injection What is this medicine? PNEUMOCOCCAL VACCINE, POLYVALENT (NEU mo KOK al vak SEEN, pol ee VEY luhnt) is a vaccine to prevent pneumococcus bacteria infection. These bacteria are a major cause of ear infections, Strep throat infections, and serious pneumonia, meningitis, or blood infections worldwide. These vaccines help the body to produce antibodies (protective substances) that help your body defend against these bacteria. This vaccine is recommended for people 372 years of age and older with health problems. It is also recommended for all adults over 82 years old. This vaccine will not treat an infection. This medicine may be used for other purposes; ask your health care provider or pharmacist if you have questions. COMMON BRAND NAME(S): Pneumovax 23 What should I tell my health care provider before I take this medicine? They need to know if you have any of these conditions: -bleeding problems -bone marrow or organ transplant -cancer, Hodgkin's disease -fever -infection -immune system problems -low platelet count in the blood -seizures -an unusual or allergic reaction to pneumococcal vaccine, diphtheria toxoid, other vaccines, latex, other medicines, foods, dyes, or preservatives -pregnant or trying to get pregnant -breast-feeding How should I use this  medicine? This vaccine is for injection into a muscle or under the skin. It is given by a health care professional. A copy of Vaccine Information Statements will be given before each vaccination. Read this sheet carefully each time. The sheet may change frequently. Talk to your pediatrician regarding the use of this medicine in children. While this drug may be prescribed for children as young as 832 years of age for selected conditions, precautions do apply. Overdosage: If you think you have taken too much of this medicine contact a poison control center or emergency room at once. NOTE: This medicine is only for you. Do not share this medicine with others. What if I miss a dose? It is important not to miss your dose. Call your doctor or health care professional if you are unable to keep an appointment. What may interact with this medicine? -medicines for cancer chemotherapy -medicines that suppress your immune function -medicines that treat or prevent blood clots like warfarin, enoxaparin, and dalteparin -steroid medicines like prednisone or cortisone This list may not describe all possible interactions. Give your health care provider a list of all the medicines, herbs, non-prescription drugs, or dietary supplements you use. Also tell them if you smoke, drink alcohol, or use illegal drugs. Some items may interact with your medicine. What should I watch for while using this medicine? Mild fever and pain should go away in 3 days or less. Report any unusual symptoms to your doctor or health care professional. What side effects may I notice from receiving this medicine? Side effects that you should report to your doctor or health care professional as  soon as possible: -allergic reactions like skin rash, itching or hives, swelling of the face, lips, or tongue -breathing problems -confused -fever over 102 degrees F -pain, tingling, numbness in the hands or feet -seizures -unusual bleeding or  bruising -unusual muscle weakness Side effects that usually do not require medical attention (report to your doctor or health care professional if they continue or are bothersome): -aches and pains -diarrhea -fever of 102 degrees F or less -headache -irritable -loss of appetite -pain, tender at site where injected -trouble sleeping This list may not describe all possible side effects. Call your doctor for medical advice about side effects. You may report side effects to FDA at 1-800-FDA-1088. Where should I keep my medicine? This does not apply. This vaccine is given in a clinic, pharmacy, doctor's office, or other health care setting and will not be stored at home. NOTE: This sheet is a summary. It may not cover all possible information. If you have questions about this medicine, talk to your doctor, pharmacist, or health care provider.  2018 Elsevier/Gold Standard (2008-06-29 14:32:37)

## 2018-01-28 ENCOUNTER — Encounter: Payer: Self-pay | Admitting: Family Medicine

## 2018-01-28 ENCOUNTER — Ambulatory Visit: Payer: PRIVATE HEALTH INSURANCE | Admitting: Family Medicine

## 2018-01-28 VITALS — BP 106/55 | HR 84 | Temp 97.5°F | Ht 69.0 in | Wt 187.8 lb

## 2018-01-28 DIAGNOSIS — N400 Enlarged prostate without lower urinary tract symptoms: Secondary | ICD-10-CM

## 2018-01-28 DIAGNOSIS — Z01818 Encounter for other preprocedural examination: Secondary | ICD-10-CM | POA: Diagnosis not present

## 2018-01-28 NOTE — Progress Notes (Signed)
   HPI  Patient presents today here for prostate check and preop evaluation.  States that he has had a long-term low back pain on the right side with right leg symptoms.  He is been evaluated by neurosurgery and has been offered surgery, he is discussing this with his family.  He can easily walk to the mailbox and back or up a flight of stairs without chest pain or shortness of breath.  He denies any chest pain. He had a negative stress test about 3 years ago and was evaluated by cardiology about 1 year ago after an episode of chest pain.  Prostate Patient doing well with Flomax, still has 3-4 episodes of nocturia but no weak stream. Would like prostate check today   PMH: Smoking status noted ROS: Per HPI  Objective: BP (!) 106/55   Pulse 84   Temp (!) 97.5 F (36.4 C) (Oral)   Ht 5\' 9"  (1.753 m)   Wt 187 lb 12.8 oz (85.2 kg)   BMI 27.73 kg/m  Gen: NAD, alert, cooperative with exam HEENT: NCAT, EOMI, PERRL CV: RRR, good S1/S2, no murmur Resp: CTABL, no wheezes, non-labored Neuro: Alert and oriented, No gross deficits Prostate: Enlarged prostate symmetric with no nodules, normal texture  Assessment and plan:  #BPH Enlarged prostate, doing well with Flomax PSA   #Preop exam Patient has high functional capacity, recent cardiology evaluation, and labs are up-to-date. I did perform CBC He has cleared for surgery if neurosurgery requests this    Orders Placed This Encounter  Procedures  . PSA  . CBC with Differential/Platelet     Murtis SinkSam Brent Taillon, MD [facility] 01/28/2018, 9:55 AM

## 2018-01-28 NOTE — Patient Instructions (Signed)
Great to see you!   

## 2018-01-29 ENCOUNTER — Other Ambulatory Visit: Payer: Self-pay | Admitting: Family Medicine

## 2018-01-29 DIAGNOSIS — F419 Anxiety disorder, unspecified: Secondary | ICD-10-CM

## 2018-01-29 LAB — CBC WITH DIFFERENTIAL/PLATELET
BASOS: 1 %
Basophils Absolute: 0 10*3/uL (ref 0.0–0.2)
EOS (ABSOLUTE): 0.2 10*3/uL (ref 0.0–0.4)
Eos: 4 %
Hematocrit: 40.8 % (ref 37.5–51.0)
Hemoglobin: 13.7 g/dL (ref 13.0–17.7)
IMMATURE GRANS (ABS): 0 10*3/uL (ref 0.0–0.1)
IMMATURE GRANULOCYTES: 0 %
LYMPHS: 17 %
Lymphocytes Absolute: 0.8 10*3/uL (ref 0.7–3.1)
MCH: 31 pg (ref 26.6–33.0)
MCHC: 33.6 g/dL (ref 31.5–35.7)
MCV: 92 fL (ref 79–97)
Monocytes Absolute: 0.7 10*3/uL (ref 0.1–0.9)
Monocytes: 14 %
NEUTROS PCT: 64 %
Neutrophils Absolute: 3.1 10*3/uL (ref 1.4–7.0)
PLATELETS: 166 10*3/uL (ref 150–379)
RBC: 4.42 x10E6/uL (ref 4.14–5.80)
RDW: 13.3 % (ref 12.3–15.4)
WBC: 4.8 10*3/uL (ref 3.4–10.8)

## 2018-01-29 LAB — PSA: Prostate Specific Ag, Serum: 1 ng/mL (ref 0.0–4.0)

## 2018-01-31 ENCOUNTER — Encounter: Payer: Self-pay | Admitting: Family Medicine

## 2018-02-03 ENCOUNTER — Emergency Department (HOSPITAL_COMMUNITY): Payer: Medicare Other

## 2018-02-03 ENCOUNTER — Encounter (HOSPITAL_COMMUNITY): Payer: Self-pay | Admitting: Emergency Medicine

## 2018-02-03 ENCOUNTER — Emergency Department (HOSPITAL_COMMUNITY)
Admission: EM | Admit: 2018-02-03 | Discharge: 2018-02-03 | Disposition: A | Discharge status: Home / Self Care | Payer: Medicare Other | Attending: Emergency Medicine | Admitting: Emergency Medicine

## 2018-02-03 DIAGNOSIS — Y9301 Activity, walking, marching and hiking: Secondary | ICD-10-CM | POA: Diagnosis not present

## 2018-02-03 DIAGNOSIS — Y92410 Unspecified street and highway as the place of occurrence of the external cause: Secondary | ICD-10-CM | POA: Diagnosis not present

## 2018-02-03 DIAGNOSIS — S82891A Other fracture of right lower leg, initial encounter for closed fracture: Secondary | ICD-10-CM

## 2018-02-03 DIAGNOSIS — S82841A Displaced bimalleolar fracture of right lower leg, initial encounter for closed fracture: Secondary | ICD-10-CM | POA: Insufficient documentation

## 2018-02-03 DIAGNOSIS — S99911A Unspecified injury of right ankle, initial encounter: Secondary | ICD-10-CM | POA: Diagnosis present

## 2018-02-03 DIAGNOSIS — S9304XA Dislocation of right ankle joint, initial encounter: Secondary | ICD-10-CM

## 2018-02-03 DIAGNOSIS — X509XXA Other and unspecified overexertion or strenuous movements or postures, initial encounter: Secondary | ICD-10-CM | POA: Insufficient documentation

## 2018-02-03 DIAGNOSIS — Z79899 Other long term (current) drug therapy: Secondary | ICD-10-CM | POA: Insufficient documentation

## 2018-02-03 DIAGNOSIS — Z8781 Personal history of (healed) traumatic fracture: Secondary | ICD-10-CM

## 2018-02-03 DIAGNOSIS — E039 Hypothyroidism, unspecified: Secondary | ICD-10-CM | POA: Diagnosis not present

## 2018-02-03 DIAGNOSIS — Y999 Unspecified external cause status: Secondary | ICD-10-CM | POA: Diagnosis not present

## 2018-02-03 MED ORDER — PROPOFOL 10 MG/ML IV BOLUS
INTRAVENOUS | Status: AC | PRN
  Administered 2018-02-03: 20 mg via INTRAVENOUS
  Administered 2018-02-03: 40 mg via INTRAVENOUS
  Administered 2018-02-03: 20 mg via INTRAVENOUS

## 2018-02-03 MED ORDER — PROPOFOL 10 MG/ML IV BOLUS: 100.0000 mg | Freq: Once | INTRAVENOUS | Status: DC

## 2018-02-03 MED ORDER — FENTANYL CITRATE (PF) 100 MCG/2ML IJ SOLN
50.0000 ug | INTRAMUSCULAR | Status: DC | PRN
  Administered 2018-02-03 (×2): 50 ug via INTRAVENOUS
  Filled 2018-02-03 (×2): qty 2

## 2018-02-03 MED ORDER — PROPOFOL 1000 MG/100ML IV EMUL
INTRAVENOUS | Status: AC
  Filled 2018-02-03: qty 100

## 2018-02-03 MED ORDER — OXYCODONE-ACETAMINOPHEN 5-325 MG PO TABS: 2.0000 | ORAL_TABLET | ORAL | 0 refills | Status: DC | PRN

## 2018-02-03 NOTE — ED Provider Notes (Signed)
[facility] EMERGENCY DEPARTMENT Provider Note   CSN: [id number] Arrival date & time: 02/03/18  1530     History   Chief Complaint Chief Complaint  Patient presents with  . Ankle Pain    HPI Michael Mcclure is a 84 y.o. male.  Chief complaint is fall, ankle pain and deformity.  HPI: Patient is 70.  He walks daily.  He was walking on the side of the road today.  He inverted his right ankle on the edge of the payment.  He fell to the ground.  He has pain and deformity at his ankle.  He did not strike his head.  He has no other areas of pain or injury.  Transferred via EMS.  Past Medical History:  Diagnosis Date  . Actinic keratitis 2017  . Anxiety   . Arthritis   . BPH (benign prostatic hyperplasia)   . Cataract   . GERD (gastroesophageal reflux disease)   . Hemorrhoids   . HOH (hard of hearing)   . Hx of colonic polyps   . Hyperlipidemia   . Hypothyroidism   . Keratosis, seborrheic 2017  . Pelvic fracture (HCC)   . Thyroid disease     Patient Active Problem List   Diagnosis Date Noted  . Osteoporosis 01/19/2017  . Osteoarthritis of hip 11/02/2016  . CKD (chronic kidney disease) stage 3, GFR 30-59 ml/min (HCC) 10/02/2016  . Venous insufficiency 05/11/2016  . Dizziness 02/07/2016  . Sensorineural hearing loss (SNHL) of both ears 10/10/2015  . Hypothyroidism 09/20/2014  . Neuropathy 02/14/2014  . Lumbar scoliosis 02/14/2014  . Anterolisthesis 02/14/2014  . GERD (gastroesophageal reflux disease) 03/19/2011  . Hyperlipidemia 03/19/2011  . Neurogenic bladder 03/19/2011  . BPH (benign prostatic hyperplasia) 03/19/2011  . Arthritis 12/06/2008  . Back pain 12/06/2008  . CARPAL TUNNEL SYNDROME, HX OF 12/06/2008    Past Surgical History:  Procedure Laterality Date  . BACK SURGERY    . CARPAL TUNNEL RELEASE Right    x2  . CATARACT EXTRACTION W/PHACO Right 05/18/2016   Procedure: CATARACT EXTRACTION PHACO AND INTRAOCULAR LENS PLACEMENT RIGHT EYE; CDE:  5.35;   Surgeon: Gemma Payor, MD;  Location: [facility];  Service: Ophthalmology;  Laterality: Right;  . ROTATOR CUFF REPAIR    . SHOULDER SURGERY Right    x 2   . WRIST SURGERY Left    fracture       Home Medications    Prior to Admission medications   Medication Sig Start Date End Date Taking? Authorizing Provider  alendronate (FOSAMAX) 70 MG tablet Take 1 tablet (70 mg total) by mouth every 7 (seven) days. Take with a full glass of water on an empty stomach. 04/07/17  Yes [name], MD  celecoxib (CELEBREX) 100 MG capsule Take 100 mg by mouth 2 (two) times daily.   Yes [provider]  cholecalciferol (VITAMIN D) 1000 units tablet Take 1,000 Units by mouth daily.    Yes [provider]  citalopram (CELEXA) 20 MG tablet Take 1 tablet (20 mg total) by mouth daily. 12/10/17  Yes [name], MD  clopidogrel (PLAVIX) 75 MG tablet TAKE 1 TABLET (75 MG TOTAL) BY MOUTH DAILY. 04/07/17  Yes [name], MD  ezetimibe-simvastatin (VYTORIN) 10-40 MG tablet TAKE 1 TABLET BY MOUTH EVERYDAY AT BEDTIME 12/13/17  Yes [name], MD  Flaxseed, Linseed, (FLAX SEED OIL) 1000 MG CAPS Take 1 capsule by mouth daily.    Yes [provider]  FLECTOR 1.3 %  PTCH PLACE 1 PATCH ONTO THE SKIN 2 (TWO) TIMES DAILY. Patient taking differently: PLACE 1 PATCH ONTO THE SKIN 2 (TWO) TIMES DAILY AS NEEDED FOR PAIN 10/21/17  Yes [name], MD  levothyroxine (SYNTHROID, LEVOTHROID) 25 MCG tablet TAKE 1 TABLET (25 MCG TOTAL) BY MOUTH DAILY. 05/04/17  Yes [name], MD  LYRICA 100 MG capsule TAKE 1 CAPSULE BY MOUTH TWICE A DAY 01/14/18  Yes Jaffe, Adam R, DO  Multiple Vitamins-Minerals (MULTIVITAMIN WITH MINERALS) tablet Take 1 tablet by mouth daily.   Yes [provider]  pantoprazole (PROTONIX) 40 MG tablet TAKE 1 TABLET BY MOUTH EVERY DAY 08/26/17  Yes [name], MD  tamsulosin (FLOMAX) 0.4 MG CAPS capsule TAKE 1 CAPSULE BY MOUTH EVERY DAY 12/21/17  Yes  [name], MD  triamcinolone ointment (KENALOG) 0.5 % APPLY 1 APPLICATION TOPICALLY 2 (TWO) TIMES DAILY AS NEEDED. 12/23/17  Yes [name], MD  citalopram (CELEXA) 20 MG tablet TAKE 1 TABLET BY MOUTH EVERY DAY Patient not taking: Reported on 02/03/2018 01/31/18   [name], MD  meloxicam (MOBIC) 7.5 MG tablet Take 1 tablet (7.5 mg total) by mouth daily. Patient not taking: Reported on 02/03/2018 12/20/17   [name], PA-C  oxyCODONE-acetaminophen (PERCOCET/ROXICET) 5-325 MG tablet Take 2 tablets by mouth every 4 (four) hours as needed. 02/03/18   [name], MD    Family History Family History  Problem Relation Age of Onset  . Colon cancer Mother   . Colon polyps Mother        ?  . Diabetes Daughter   . Heart disease Father        No details  . Emphysema Father        black lung / coal miner  . Heart disease Brother        Died during bypass age about 37.  . Diabetes Son   . Emphysema Son        black lung  . Heart disease Brother        Unclear age of onset.  CAD.      Social History Social History   Tobacco Use  . Smoking status: Never Smoker  . Smokeless tobacco: Never Used  Substance Use Topics  . Alcohol use: No  . Drug use: No     Allergies   Hydrocodone and Ciprofloxacin   Review of Systems Review of Systems  Constitutional: Negative for appetite change, chills, diaphoresis, fatigue and fever.  HENT: Negative for mouth sores, sore throat and trouble swallowing.   Eyes: Negative for visual disturbance.  Respiratory: Negative for cough, chest tightness, shortness of breath and wheezing.   Cardiovascular: Negative for chest pain.  Gastrointestinal: Negative for abdominal distention, abdominal pain, diarrhea, nausea and vomiting.  Endocrine: Negative for polydipsia, polyphagia and polyuria.  Genitourinary: Negative for dysuria, frequency and hematuria.  Musculoskeletal: Negative for gait problem.       Right ankle pain and deformity    Skin: Negative for color change, pallor and rash.  Neurological: Negative for dizziness, syncope, light-headedness and headaches.  Hematological: Does not bruise/bleed easily.  Psychiatric/Behavioral: Negative for behavioral problems and confusion.     Physical Exam Updated Vital Signs BP 131/75   Pulse 74   Temp 97.9 F (36.6 C) (Oral)   Resp 18   Ht 5\' 9"  (1.753 m)   Wt 83.9 kg (185 lb)   SpO2 99%   BMI 27.32 kg/m   Physical Exam  Constitutional:  He is oriented to person, place, and time. He appears well-developed and well-nourished. No distress.  HENT:  Head: Normocephalic.  Eyes: Conjunctivae are normal. Pupils are equal, round, and reactive to light. No scleral icterus.  Neck: Normal range of motion. Neck supple. No thyromegaly present.  Cardiovascular: Normal rate and regular rhythm. Exam reveals no gallop and no friction rub.  No murmur heard. Pulmonary/Chest: Effort normal and breath sounds normal. No respiratory distress. He has no wheezes. He has no rales.  Abdominal: Soft. Bowel sounds are normal. He exhibits no distension. There is no tenderness. There is no rebound.  Musculoskeletal: Normal range of motion.  Soft tissue swelling and early ecchymosis with deformity at the right ankle.  Good perfusion, cap refill, and pulses distally.  Closed injury.  No compromise of the skin.  He has no proximal tibia, or fibula pain or tenderness  Neurological: He is alert and oriented to person, place, and time.  Skin: Skin is warm and dry. No rash noted.  Psychiatric: He has a normal mood and affect. His behavior is normal.     ED Treatments / Results  Labs (all labs ordered are listed, but only abnormal results are displayed) Labs Reviewed - No data to display  EKG  EKG Interpretation None       Radiology Dg Tibia/fibula Right  Result Date: 02/03/2018 CLINICAL DATA:  Acute right ankle pain after injury today. EXAM: RIGHT TIBIA AND FIBULA - 2 VIEW COMPARISON:   None. FINDINGS: Lateral dislocation of talus relative to tibia is noted with severely displaced fractures involving the distal fibula and medial malleolus. Overlying soft tissue swelling is noted. IMPRESSION: Lateral dislocation of talus relative to tibia with severely displaced distal fibular and medial malleolar fractures. Electronically Signed   By: Lupita Raider, M.D.   On: 02/03/2018 16:26   Dg Ankle Complete Right  Result Date: 02/03/2018 CLINICAL DATA:  Acute right ankle pain after injury today. EXAM: RIGHT ANKLE - COMPLETE 3+ VIEW COMPARISON:  None. FINDINGS: Lateral dislocation of talus relative to distal tibia is noted, with associated severely displaced fractures involving the distal fibula and medial malleolus. Soft tissue swelling is noted. IMPRESSION: Lateral dislocation of talus relative to distal tibia, with severely displaced distal fibular and medial malleolar fractures. Electronically Signed   By: Lupita Raider, M.D.   On: 02/03/2018 16:25   Dg Ankle Right Port  Result Date: 02/03/2018 CLINICAL DATA:  Status post reduction. EXAM: PORTABLE RIGHT ANKLE - 2 VIEW COMPARISON:  Right ankle x-rays from same day. FINDINGS: Interval reduction of the talar subluxation and bimalleolar fractures, now in near anatomic alignment. The ankle mortise is symmetric. The talar dome is intact. Osteopenia. Prominent soft tissue swelling about the ankle. IMPRESSION: Interval reduction of the talar subluxation and bimalleolar fractures, now in near anatomic alignment. Electronically Signed   By: Obie Dredge M.D.   On: 02/03/2018 17:18    Procedures .Sedation Date/Time: 02/03/2018 4:45 PM Performed by: [name], MD Authorized by: [name], MD   Consent:    Consent obtained:  Verbal   Consent given by:  Patient   Risks discussed:  Allergic reaction, dysrhythmia, inadequate sedation, nausea, prolonged hypoxia resulting in organ damage, prolonged sedation necessitating reversal, respiratory  compromise necessitating ventilatory assistance and intubation and vomiting   Alternatives discussed:  Analgesia without sedation, anxiolysis and regional anesthesia Universal protocol:    Procedure explained and questions answered to patient or proxy's satisfaction: yes     Relevant documents present  and verified: yes     Test results available and properly labeled: yes     Imaging studies available: yes     Required blood products, implants, devices, and special equipment available: yes     Site/side marked: yes     Immediately prior to procedure a time out was called: yes     Patient identity confirmation method:  Verbally with patient Indications:    Procedure necessitating sedation performed by:  Physician performing sedation   Intended level of sedation:  Deep and moderate (conscious sedation) Pre-sedation assessment:    Time since last food or drink:  12:00   ASA classification: class 1 - normal, healthy patient     Neck mobility: normal     Mouth opening:  3 or more finger widths   Thyromental distance:  4 finger widths   Mallampati score:  I - soft palate, uvula, fauces, pillars visible   Pre-sedation assessment completed:  02/03/2018 4:30 PM Immediate pre-procedure details:    Reassessment: Patient reassessed immediately prior to procedure     Reviewed: vital signs, relevant labs/tests and NPO status     Verified: bag valve mask available, emergency equipment available, intubation equipment available, IV patency confirmed, oxygen available and suction available   Procedure details (see MAR for exact dosages):    Preoxygenation:  Nasal cannula   Sedation:  Propofol   Intra-procedure monitoring:  Blood pressure monitoring, cardiac monitor, continuous pulse oximetry, frequent LOC assessments, frequent vital sign checks and continuous capnometry   Intra-procedure events: none     Total Provider sedation time (minutes):  20 Post-procedure details:    Post-sedation assessment  completed:  02/03/2018 6:18 PM   Attendance: Constant attendance by certified staff until patient recovered     Recovery: Patient returned to pre-procedure baseline     Patient is stable for discharge or admission: yes     Patient tolerance:  Tolerated well, no immediate complications   (including critical care time)  Reduction of dislocation Date/Time: 17:45pm Performed by: Claudean KindsMark Joseph Tabita Corbo Authorized by: Claudean KindsMark Joseph Sumedha Munnerlyn Consent: Verbal consent obtained. Risks and benefits: risks, benefits and alternatives were discussed Consent given by: patient Required items: required blood products, implants, devices, and special equipment available Time out: Immediately prior to procedure a "time out" was called to verify the correct patient, procedure, equipment, support staff and site/side marked as required.  Patient sedated: +  Vitals: Vital signs were monitored during sedation. Patient tolerance: Patient tolerated the procedure well with no immediate complications. Joint: Right talo-tibial Reduction technique: Knee flexion, Evesrion-distraction-inversion   Post procedure x-rays show near anatomic reduction.  Patient splinted by myself with assistance of nursing.SPLINT APPLICATION Date/Time: 6:21 PM Authorized by: Claudean KindsMark Joseph Jaidin Richison Consent: Verbal consent obtained. Risks and benefits: risks, benefits and alternatives were discussed Consent given by: patient Splint applied by: ME Location details: right ankle Splint type: coaptation Supplies used: Orthoglass, webroll, ace wraps Post-procedure: The splinted body part was neurovascularly unchanged following the procedure. Patient tolerance: Patient tolerated the procedure well with no immediate complications.     Medications Ordered in ED Medications  fentaNYL (SUBLIMAZE) injection 50 mcg (50 mcg Intravenous Given 02/03/18 1702)  propofol (DIPRIVAN) 1000 MG/100ML infusion (not administered)  propofol (DIPRIVAN) 10 mg/mL bolus/IV  push 100 mg (not administered)  propofol (DIPRIVAN) 10 mg/mL bolus/IV push (20 mg Intravenous Given 02/03/18 1648)     Initial Impression / Assessment and Plan / ED Course  I have reviewed the triage vital signs and the nursing notes.  Pertinent labs & imaging results that were available during my care of the patient were reviewed by me and considered in my medical decision making (see chart for details).   X-rays show fracture dislocation of the right ankle bimalleolar with lateral talotibial dislocation.  Discussed risks and benefit of sedation and reduction with patient.  He consents.  Discussed with Dr. Tiburcio Pea and he will see patient his office tomorrow morning 9 AM to discuss definitive treatment    Final Clinical Impressions(s) / ED Diagnoses   Final diagnoses:  Closed fracture of right ankle, initial encounter  Ankle dislocation, right, initial encounter    ED Discharge Orders        Ordered    oxyCODONE-acetaminophen (PERCOCET/ROXICET) 5-325 MG tablet  Every 4 hours PRN     02/03/18 1801       [name], MD 02/03/18 1823

## 2018-02-03 NOTE — Discharge Instructions (Addendum)
Non weight bearing on right leg. [place] for pain. See Dr. Romeo AppleHarrison in his office tomorrow at 9am.  Jeani HawkingAnnie Penn  Case Manager will contact you tomorrow to discuss need for home--wheelchair, visiting nurse, etc.

## 2018-02-03 NOTE — ED Triage Notes (Signed)
Pt reports walking and went to step off and rolled his right ankle.  Deformity noted.

## 2018-02-04 ENCOUNTER — Ambulatory Visit (INDEPENDENT_AMBULATORY_CARE_PROVIDER_SITE_OTHER): Payer: Medicare (Managed Care) | Admitting: Orthopedic Surgery

## 2018-02-04 ENCOUNTER — Encounter: Payer: Self-pay | Admitting: Orthopedic Surgery

## 2018-02-04 VITALS — BP 152/65 | HR 75 | Ht 71.0 in | Wt 187.0 lb

## 2018-02-04 DIAGNOSIS — S82841A Displaced bimalleolar fracture of right lower leg, initial encounter for closed fracture: Secondary | ICD-10-CM | POA: Diagnosis not present

## 2018-02-04 NOTE — Patient Instructions (Signed)
No weight-bearing

## 2018-02-04 NOTE — Progress Notes (Signed)
NEW PATIENT OFFICE VISIT    Chief Complaint  Patient presents with  . Ankle Injury    right ankle fracture dislocation 02/03/18    84 year old male on Plavix for vertebral artery stenosis?  Presents after closed treatment with closed reduction right ankle bimalleolar fracture dislocation, successful reduction performed in the emergency room  He presents for definitive management  And complains of mild to moderate dull aching pain right ankle medial lateral side times 1 day with a date of injury recorded as February 28    Review of Systems  Constitutional: Negative for chills and fever.  Skin: Negative.   Neurological: Positive for headaches. Negative for tingling and sensory change.     Past Medical History:  Diagnosis Date  . Actinic keratitis 2017  . Anxiety   . Arthritis   . BPH (benign prostatic hyperplasia)   . Cataract   . GERD (gastroesophageal reflux disease)   . Hemorrhoids   . HOH (hard of hearing)   . Hx of colonic polyps   . Hyperlipidemia   . Hypothyroidism   . Keratosis, seborrheic 2017  . Pelvic fracture (HCC)   . Thyroid disease     Past Surgical History:  Procedure Laterality Date  . BACK SURGERY    . CARPAL TUNNEL RELEASE Right    x2  . CATARACT EXTRACTION W/PHACO Right 05/18/2016   Procedure: CATARACT EXTRACTION PHACO AND INTRAOCULAR LENS PLACEMENT RIGHT EYE; CDE:  5.35;  Surgeon: Gemma Payor, MD;  Location: [facility];  Service: Ophthalmology;  Laterality: Right;  . ROTATOR CUFF REPAIR    . SHOULDER SURGERY Right    x 2   . WRIST SURGERY Left    fracture    Family History  Problem Relation Age of Onset  . Colon cancer Mother   . Colon polyps Mother        ?  . Diabetes Daughter   . Heart disease Father        No details  . Emphysema Father        black lung / coal miner  . Heart disease Brother        Died during bypass age about 26.  . Diabetes Son   . Emphysema Son        black lung  . Heart disease Brother        Unclear age of  onset.  CAD.     Social History   Tobacco Use  . Smoking status: Never Smoker  . Smokeless tobacco: Never Used  Substance Use Topics  . Alcohol use: No  . Drug use: No    Allergies  Allergen Reactions  . Hydrocodone Shortness Of Breath  . Ciprofloxacin Other (See Comments)    Question caused short of breath.     Current Meds  Medication Sig  . alendronate (FOSAMAX) 70 MG tablet Take 1 tablet (70 mg total) by mouth every 7 (seven) days. Take with a full glass of water on an empty stomach.  . celecoxib (CELEBREX) 100 MG capsule Take 100 mg by mouth 2 (two) times daily.  . cholecalciferol (VITAMIN D) 1000 units tablet Take 1,000 Units by mouth daily.   . citalopram (CELEXA) 20 MG tablet Take 1 tablet (20 mg total) by mouth daily.  . clopidogrel (PLAVIX) 75 MG tablet TAKE 1 TABLET (75 MG TOTAL) BY MOUTH DAILY.  Marland Kitchen ezetimibe-simvastatin (VYTORIN) 10-40 MG tablet TAKE 1 TABLET BY MOUTH EVERYDAY AT BEDTIME  . Flaxseed, Linseed, (FLAX SEED OIL) 1000 MG CAPS  Take 1 capsule by mouth daily.   Marland Kitchen. FLECTOR 1.3 % PTCH PLACE 1 PATCH ONTO THE SKIN 2 (TWO) TIMES DAILY. (Patient taking differently: PLACE 1 PATCH ONTO THE SKIN 2 (TWO) TIMES DAILY AS NEEDED FOR PAIN)  . levothyroxine (SYNTHROID, LEVOTHROID) 25 MCG tablet TAKE 1 TABLET (25 MCG TOTAL) BY MOUTH DAILY.  Marland Kitchen. LYRICA 100 MG capsule TAKE 1 CAPSULE BY MOUTH TWICE A DAY  . Multiple Vitamins-Minerals (MULTIVITAMIN WITH MINERALS) tablet Take 1 tablet by mouth daily.  Marland Kitchen. oxyCODONE-acetaminophen (PERCOCET/ROXICET) 5-325 MG tablet Take 2 tablets by mouth every 4 (four) hours as needed.  . pantoprazole (PROTONIX) 40 MG tablet TAKE 1 TABLET BY MOUTH EVERY DAY  . tamsulosin (FLOMAX) 0.4 MG CAPS capsule TAKE 1 CAPSULE BY MOUTH EVERY DAY  . triamcinolone ointment (KENALOG) 0.5 % APPLY 1 APPLICATION TOPICALLY 2 (TWO) TIMES DAILY AS NEEDED.    BP (!) 152/65   Pulse 75   Ht 5\' 11"  (1.803 m)   Wt 187 lb (84.8 kg)   BMI 26.08 kg/m   Physical Exam   Constitutional: He is oriented to person, place, and time. He appears well-developed and well-nourished.  Vital signs have been reviewed and are stable. Gen. appearance the patient is well-developed and well-nourished with normal grooming and hygiene.   Neurological: He is alert and oriented to person, place, and time.  Skin: Skin is warm and dry. No erythema.  Psychiatric: He has a normal mood and affect.  Vitals reviewed.   Ortho Exam  Left lower extremity  normal alignment.  No contractures.  Joints are reduced without subluxation muscle tone normal skin without rash lesion or ulceration pulse and perfusion normal lymph nodes negative and station  Right lower extremity Splint is on the right leg color capillary refill foot normal sensation in the toes normal skin that was visible was normal muscle tone was normal knee and ankle showed no subluxation alignment look normal   No orders of the defined types were placed in this encounter.   Encounter Diagnosis  Name Primary?  . Closed bimalleolar fracture of right ankle, initial encounter Yes     PLAN:   Continue splint recheck x-ray in about 11 days.  If he does not this place he can continue with a cast at that time if he does displace we will have to manage the Plavix get that stopped and then perform ORIF of the ankle

## 2018-02-04 NOTE — Care Management (Signed)
CM consult [facility] and [facility]. No HH or DME ordered by EDP. CM has contacted Dr. Mort SawyersHarrison's office who does not have pt scheduled for this AM but does see not and AVS from ED. Message left for Dr. Romeo AppleHarrison inpt CM will be happy to facilitate DME/HH referral if he places order.

## 2018-02-08 ENCOUNTER — Ambulatory Visit: Payer: PRIVATE HEALTH INSURANCE | Admitting: Family Medicine

## 2018-02-10 DIAGNOSIS — S82891A Other fracture of right lower leg, initial encounter for closed fracture: Secondary | ICD-10-CM | POA: Insufficient documentation

## 2018-02-14 ENCOUNTER — Telehealth: Payer: Self-pay | Admitting: Orthopedic Surgery

## 2018-02-14 NOTE — Telephone Encounter (Signed)
Call received earlier today; per son Casimiro NeedleMichael - requests to cancel follow up appointment 02/15/18, offered re-schedule - states does not wish to re-schedule.

## 2018-02-15 ENCOUNTER — Ambulatory Visit: Payer: Medicare (Managed Care) | Admitting: Orthopedic Surgery

## 2018-02-28 ENCOUNTER — Telehealth: Payer: Self-pay | Admitting: Family Medicine

## 2018-02-28 MED ORDER — GABAPENTIN 600 MG PO TABS: 600.0000 mg | ORAL_TABLET | Freq: Two times a day (BID) | ORAL | 2 refills | Status: DC

## 2018-02-28 NOTE — Telephone Encounter (Signed)
Patient wanting to switch back from Lyrica to gabapentin, previously on 600-800 mg of gabapentin twice daily. Start with 300 mg, 1/2 tablet of 600 mg tablet, twice daily and then increase as tolerated after a week.  Michael SinkSam Song Myre, MD [facility] 02/28/2018, 12:02 PM

## 2018-02-28 NOTE — Telephone Encounter (Signed)
Patient notified

## 2018-02-28 NOTE — Telephone Encounter (Signed)
Please review and advise on medication. Please route to Pool A

## 2018-02-28 NOTE — Telephone Encounter (Signed)
Pt fell and broke left leg 3 weeks ago. [facility]. Pt wants to get off lyrica and get back on Gabapentin. Pt Uses [facility].

## 2018-03-01 ENCOUNTER — Other Ambulatory Visit: Payer: Self-pay | Admitting: *Deleted

## 2018-03-01 DIAGNOSIS — M810 Age-related osteoporosis without current pathological fracture: Secondary | ICD-10-CM

## 2018-03-01 MED ORDER — ALENDRONATE SODIUM 70 MG PO TABS: 70.0000 mg | ORAL_TABLET | ORAL | 1 refills | Status: DC

## 2018-03-08 ENCOUNTER — Other Ambulatory Visit: Payer: Self-pay | Admitting: Family Medicine

## 2018-03-12 ENCOUNTER — Other Ambulatory Visit: Payer: Self-pay | Admitting: Family Medicine

## 2018-03-12 DIAGNOSIS — E785 Hyperlipidemia, unspecified: Secondary | ICD-10-CM

## 2018-03-16 ENCOUNTER — Other Ambulatory Visit: Payer: Self-pay | Admitting: Pediatrics

## 2018-03-16 DIAGNOSIS — N400 Enlarged prostate without lower urinary tract symptoms: Secondary | ICD-10-CM

## 2018-03-22 ENCOUNTER — Other Ambulatory Visit: Payer: Self-pay | Admitting: *Deleted

## 2018-03-22 MED ORDER — GABAPENTIN 600 MG PO TABS: 600.0000 mg | ORAL_TABLET | Freq: Two times a day (BID) | ORAL | 0 refills | Status: DC

## 2018-03-31 ENCOUNTER — Other Ambulatory Visit: Payer: Self-pay | Admitting: Family Medicine

## 2018-04-04 ENCOUNTER — Other Ambulatory Visit: Payer: Self-pay | Admitting: Family Medicine

## 2018-04-08 ENCOUNTER — Ambulatory Visit: Payer: PRIVATE HEALTH INSURANCE | Admitting: Family Medicine

## 2018-04-10 ENCOUNTER — Other Ambulatory Visit: Payer: Self-pay | Admitting: Pediatrics

## 2018-04-25 ENCOUNTER — Other Ambulatory Visit: Payer: Self-pay | Admitting: Family Medicine

## 2018-04-29 ENCOUNTER — Other Ambulatory Visit: Payer: Self-pay | Admitting: Family Medicine

## 2018-04-29 NOTE — Telephone Encounter (Signed)
Last thyroid 04/07/17

## 2018-05-06 ENCOUNTER — Encounter: Payer: Self-pay | Admitting: Family Medicine

## 2018-05-06 ENCOUNTER — Ambulatory Visit: Payer: PRIVATE HEALTH INSURANCE | Admitting: Family Medicine

## 2018-05-06 VITALS — BP 101/64 | HR 75 | Temp 97.5°F | Ht 71.0 in | Wt 189.0 lb

## 2018-05-06 DIAGNOSIS — R7309 Other abnormal glucose: Secondary | ICD-10-CM | POA: Diagnosis not present

## 2018-05-06 DIAGNOSIS — E039 Hypothyroidism, unspecified: Secondary | ICD-10-CM

## 2018-05-06 DIAGNOSIS — R0982 Postnasal drip: Secondary | ICD-10-CM | POA: Diagnosis not present

## 2018-05-06 DIAGNOSIS — E785 Hyperlipidemia, unspecified: Secondary | ICD-10-CM | POA: Diagnosis not present

## 2018-05-06 MED ORDER — OXYCODONE-ACETAMINOPHEN 5-325 MG PO TABS: 1.0000 | ORAL_TABLET | Freq: Three times a day (TID) | ORAL | 0 refills | Status: AC | PRN

## 2018-05-06 NOTE — Addendum Note (Signed)
Addended by: [name] on: 05/06/2018 04:41 PM   Modules accepted: Orders

## 2018-05-06 NOTE — Progress Notes (Addendum)
   HPI  Patient presents today for follow-up chronic medical conditions.  Hyperlipidemia Good medication compliance Nonfasting.  Hypothyroidism Good medication compliance, non-symptomatic  We discussed his recent ankle fracture, he is recovering well.  Patient also complains of frequent throat clearing, cough, and sore throat over the last 2 months. He is using Mucinex without improvement.  He is also using nasal saline.  PMH: Smoking status noted ROS: Per HPI  Objective: BP 101/64   Pulse 75   Temp (!) 97.5 F (36.4 C) (Oral)   Ht '5\' 11"'$  (1.803 m)   Wt 189 lb (85.7 kg)   BMI 26.36 kg/m  Gen: NAD, alert, cooperative with exam HEENT: NCAT, swollen boggy nasal mucosa, TMs normal bilaterally, oropharynx moist with cobblestoning CV: RRR, good S1/S2, no murmur Resp: CTABL, no wheezes, non-labored Ext: No edema, warm Neuro: Alert and oriented, No gross deficits   Healing scar of the right medial ankle  Assessment and plan:  #Postnasal drip Continue nasal saline, add daily Zyrtec Discontinue guaifenesin  #Hyperlipidemia Labs Well-controlled previously  #Hypothyroidism Asymptomatic Labs, no chnages     Orders Placed This Encounter  Procedures  . TSH  . CMP14+EGFR  . Lipid panel     Laroy Apple, MD [facility] 05/06/2018, 4:37 PM   Requesting refill of oxycodone, Wilson small amount, as this is understandable pain after his surgery and injury.  Laroy Apple, MD [facility] 05/06/2018, 4:40 PM

## 2018-05-06 NOTE — Patient Instructions (Signed)
Great to see you!   Try 10 mg of cetirizine once daily for your frequent cough.

## 2018-05-07 LAB — CMP14+EGFR
ALT: 16 IU/L (ref 0–44)
AST: 22 IU/L (ref 0–40)
Albumin/Globulin Ratio: 2 (ref 1.2–2.2)
Albumin: 4 g/dL (ref 3.5–4.7)
Alkaline Phosphatase: 48 IU/L (ref 39–117)
BUN/Creatinine Ratio: 14 (ref 10–24)
BUN: 17 mg/dL (ref 8–27)
Bilirubin Total: 0.4 mg/dL (ref 0.0–1.2)
CALCIUM: 9.2 mg/dL (ref 8.6–10.2)
CO2: 24 mmol/L (ref 20–29)
Chloride: 104 mmol/L (ref 96–106)
Creatinine, Ser: 1.18 mg/dL (ref 0.76–1.27)
GFR, EST AFRICAN AMERICAN: 66 mL/min/{1.73_m2} (ref >59)
GFR, EST NON AFRICAN AMERICAN: 57 mL/min/{1.73_m2} — AB (ref >59)
Globulin, Total: 2 g/dL (ref 1.5–4.5)
Glucose: 162 mg/dL — ABNORMAL HIGH (ref 65–99)
Potassium: 4 mmol/L (ref 3.5–5.2)
Sodium: 141 mmol/L (ref 134–144)
TOTAL PROTEIN: 6 g/dL (ref 6.0–8.5)

## 2018-05-07 LAB — LIPID PANEL
Chol/HDL Ratio: 2.6 ratio (ref 0.0–5.0)
Cholesterol, Total: 130 mg/dL (ref 100–199)
HDL: 50 mg/dL (ref >39)
LDL Calculated: 61 mg/dL (ref 0–99)
TRIGLYCERIDES: 95 mg/dL (ref 0–149)
VLDL CHOLESTEROL CAL: 19 mg/dL (ref 5–40)

## 2018-05-07 LAB — TSH: TSH: 2.27 u[IU]/mL (ref 0.450–4.500)

## 2018-05-09 NOTE — Addendum Note (Signed)
Addended by: Lorelee CoverOSTOSKY, JESSICA C on: 05/09/2018 04:54 PM   Modules accepted: Orders

## 2018-05-10 LAB — BAYER DCA HB A1C WAIVED: HB A1C: 5.4 % (ref <7.0)

## 2018-05-23 ENCOUNTER — Other Ambulatory Visit: Payer: Self-pay | Admitting: Family Medicine

## 2018-06-12 ENCOUNTER — Other Ambulatory Visit: Payer: Self-pay | Admitting: Family Medicine

## 2018-06-12 DIAGNOSIS — N400 Enlarged prostate without lower urinary tract symptoms: Secondary | ICD-10-CM

## 2018-06-15 ENCOUNTER — Telehealth: Payer: Self-pay | Admitting: Family Medicine

## 2018-06-15 NOTE — Telephone Encounter (Signed)
Recommend caution with prescribing narcotics, has received from multiple prescribers.   Notified by Gala Romneycvs  Sam Shaquita Fort, MD [facility] 06/15/2018, 9:03 AM

## 2018-06-16 ENCOUNTER — Other Ambulatory Visit: Payer: Self-pay | Admitting: Family Medicine

## 2018-06-29 ENCOUNTER — Other Ambulatory Visit: Payer: Self-pay | Admitting: Family Medicine

## 2018-07-18 ENCOUNTER — Other Ambulatory Visit: Payer: Self-pay

## 2018-07-18 MED ORDER — CELECOXIB 100 MG PO CAPS: 100.0000 mg | ORAL_CAPSULE | Freq: Two times a day (BID) | ORAL | 0 refills | Status: DC

## 2018-07-18 NOTE — Telephone Encounter (Signed)
Last seen 05/06/18  Dr Bradshaw 

## 2018-08-12 ENCOUNTER — Other Ambulatory Visit: Payer: Self-pay

## 2018-08-12 MED ORDER — LEVOTHYROXINE SODIUM 25 MCG PO TABS: ORAL_TABLET | ORAL | 1 refills | Status: DC

## 2018-08-15 ENCOUNTER — Other Ambulatory Visit: Payer: Self-pay | Admitting: Family Medicine

## 2018-08-15 NOTE — Telephone Encounter (Signed)
Last CMP 05/06/2018, last office visit same date

## 2018-08-17 ENCOUNTER — Other Ambulatory Visit: Payer: Self-pay | Admitting: *Deleted

## 2018-08-17 DIAGNOSIS — M810 Age-related osteoporosis without current pathological fracture: Secondary | ICD-10-CM

## 2018-08-17 MED ORDER — ALENDRONATE SODIUM 70 MG PO TABS: 70.0000 mg | ORAL_TABLET | ORAL | 0 refills | Status: DC

## 2018-08-17 NOTE — Telephone Encounter (Signed)
OV 08/31/18 

## 2018-08-31 ENCOUNTER — Ambulatory Visit: Payer: PRIVATE HEALTH INSURANCE | Admitting: Family Medicine

## 2018-08-31 ENCOUNTER — Encounter: Payer: Self-pay | Admitting: Family Medicine

## 2018-08-31 VITALS — BP 123/61 | HR 77 | Temp 97.1°F | Ht 71.0 in | Wt 186.0 lb

## 2018-08-31 DIAGNOSIS — N400 Enlarged prostate without lower urinary tract symptoms: Secondary | ICD-10-CM | POA: Diagnosis not present

## 2018-08-31 DIAGNOSIS — K219 Gastro-esophageal reflux disease without esophagitis: Secondary | ICD-10-CM

## 2018-08-31 DIAGNOSIS — E785 Hyperlipidemia, unspecified: Secondary | ICD-10-CM

## 2018-08-31 DIAGNOSIS — E039 Hypothyroidism, unspecified: Secondary | ICD-10-CM

## 2018-08-31 DIAGNOSIS — Z23 Encounter for immunization: Secondary | ICD-10-CM

## 2018-08-31 DIAGNOSIS — M810 Age-related osteoporosis without current pathological fracture: Secondary | ICD-10-CM | POA: Diagnosis not present

## 2018-08-31 MED ORDER — TRIAMCINOLONE ACETONIDE 0.5 % EX OINT: 1.0000 "application " | TOPICAL_OINTMENT | Freq: Two times a day (BID) | CUTANEOUS | 2 refills | Status: DC | PRN

## 2018-08-31 MED ORDER — LEVOTHYROXINE SODIUM 25 MCG PO TABS: ORAL_TABLET | ORAL | 2 refills | Status: DC

## 2018-08-31 MED ORDER — GABAPENTIN 800 MG PO TABS: 800.0000 mg | ORAL_TABLET | Freq: Two times a day (BID) | ORAL | 2 refills | Status: DC

## 2018-08-31 MED ORDER — ALENDRONATE SODIUM 70 MG PO TABS: 70.0000 mg | ORAL_TABLET | ORAL | 0 refills | Status: DC

## 2018-08-31 MED ORDER — CELECOXIB 100 MG PO CAPS: 100.0000 mg | ORAL_CAPSULE | Freq: Two times a day (BID) | ORAL | 5 refills | Status: DC

## 2018-08-31 MED ORDER — PANTOPRAZOLE SODIUM 40 MG PO TBEC: 40.0000 mg | DELAYED_RELEASE_TABLET | Freq: Every day | ORAL | 3 refills | Status: DC

## 2018-08-31 MED ORDER — EZETIMIBE-SIMVASTATIN 10-40 MG PO TABS: ORAL_TABLET | ORAL | 3 refills | Status: DC

## 2018-08-31 MED ORDER — TAMSULOSIN HCL 0.4 MG PO CAPS: 0.4000 mg | ORAL_CAPSULE | Freq: Every day | ORAL | 3 refills | Status: DC

## 2018-08-31 NOTE — Progress Notes (Signed)
BP 123/61   Pulse 77   Temp (!) 97.1 F (36.2 C) (Oral)   Ht '5\' 11"'$  (1.803 m)   Wt 186 lb (84.4 kg)   BMI 25.94 kg/m    Subjective:    Patient ID: Michael Mcclure, male    DOB: 1932-09-23, 84 y.o.   MRN: [id number]  HPI: Michael Mcclure is a 84 y.o. male presenting on 08/31/2018 for Hyperlipidemia (4 month follow up- Pateint would like gabapentin increased ) and Establish Care Michael Mcclure pt )   HPI Hyperlipidemia Patient is coming in for recheck of his hyperlipidemia. The patient is currently taking Vytorin. They deny any issues with myalgias or history of liver damage from it. They deny any focal numbness or weakness or chest pain.   GERD Patient is currently on Protonix.  She denies any major symptoms or abdominal pain or belching or burping. She denies any blood in her stool or lightheadedness or dizziness.   Hypothyroidism recheck Patient is coming in for thyroid recheck today as well. They deny any issues with hair changes or heat or cold problems or diarrhea or constipation. They deny any chest pain or palpitations. They are currently on levothyroxine 25 micrograms   Osteoporosis medication refill Patient is doing good on osteoporosis medication, he is a year and a half out from his last DEXA scan so he is not due for one yet, we will do one after its been at least 2 years.  Relevant past medical, surgical, family and social history reviewed and updated as indicated. Interim medical history since our last visit reviewed. Allergies and medications reviewed and updated.  Review of Systems  Constitutional: Negative for chills and fever.  Eyes: Negative for visual disturbance.  Respiratory: Negative for shortness of breath and wheezing.   Cardiovascular: Negative for chest pain and leg swelling.  Musculoskeletal: Negative for back pain and gait problem.  Skin: Negative for rash.  Neurological: Negative for dizziness, weakness and light-headedness.  All other systems  reviewed and are negative.   Per HPI unless specifically indicated above   Allergies as of 08/31/2018      Reactions   Hydrocodone Shortness Of Breath   Ciprofloxacin Other (See Comments)   Question caused short of breath.      Medication List        Accurate as of 08/31/18  1:45 PM. Always use your most recent med list.          alendronate 70 MG tablet Commonly known as:  FOSAMAX Take 1 tablet (70 mg total) by mouth every 7 (seven) days. Take with a full glass of water on an empty stomach.   celecoxib 100 MG capsule Commonly known as:  CELEBREX Take 1 capsule (100 mg total) by mouth 2 (two) times daily.   cholecalciferol 1000 units tablet Commonly known as:  VITAMIN D Take 1,000 Units by mouth daily.   citalopram 20 MG tablet Commonly known as:  CELEXA Take 1 tablet (20 mg total) by mouth daily.   clopidogrel 75 MG tablet Commonly known as:  PLAVIX TAKE 1 TABLET BY MOUTH EVERY DAY   ezetimibe-simvastatin 10-40 MG tablet Commonly known as:  VYTORIN TAKE 1 TABLET BY MOUTH EVERYDAY AT BEDTIME   Flax Seed Oil 1000 MG Caps Take 1 capsule by mouth daily.   FLECTOR 1.3 % Ptch Generic drug:  diclofenac PLACE 1 PATCH ONTO THE SKIN 2 (TWO) TIMES DAILY.   gabapentin 600 MG tablet Commonly known as:  NEURONTIN TAKE 1  TABLET BY MOUTH TWICE A DAY   levothyroxine 25 MCG tablet Commonly known as:  SYNTHROID, LEVOTHROID TAKE 1 TABLET (25 MCG TOTAL) BY MOUTH DAILY.   multivitamin with minerals tablet Take 1 tablet by mouth daily.   pantoprazole 40 MG tablet Commonly known as:  PROTONIX Take 1 tablet (40 mg total) by mouth daily.   tamsulosin 0.4 MG Caps capsule Commonly known as:  FLOMAX Take 1 capsule (0.4 mg total) by mouth daily.   triamcinolone ointment 0.5 % Commonly known as:  KENALOG Apply 1 application topically 2 (two) times daily as needed.          Objective:    BP 123/61   Pulse 77   Temp (!) 97.1 F (36.2 C) (Oral)   Ht '5\' 11"'$  (1.803 m)    Wt 186 lb (84.4 kg)   BMI 25.94 kg/m   Wt Readings from Last 3 Encounters:  08/31/18 186 lb (84.4 kg)  05/06/18 189 lb (85.7 kg)  02/04/18 187 lb (84.8 kg)    Physical Exam  Constitutional: He is oriented to person, place, and time. He appears well-developed and well-nourished. No distress.  Eyes: Conjunctivae are normal. No scleral icterus.  Neck: Neck supple. No thyromegaly present.  Cardiovascular: Normal rate, regular rhythm, normal heart sounds and intact distal pulses.  No murmur heard. Pulmonary/Chest: Effort normal and breath sounds normal. No respiratory distress. He has no wheezes.  Musculoskeletal: Normal range of motion. He exhibits no edema.  Lymphadenopathy:    He has no cervical adenopathy.  Neurological: He is alert and oriented to person, place, and time. Coordination normal.  Skin: Skin is warm and dry. No rash noted. He is not diaphoretic.  Psychiatric: He has a normal mood and affect. His behavior is normal.  Nursing note and vitals reviewed.       Assessment & Plan:   Problem List Items Addressed This Visit      Digestive   GERD (gastroesophageal reflux disease)   Relevant Medications   pantoprazole (PROTONIX) 40 MG tablet   Other Relevant Orders   CBC with Differential/Platelet     Endocrine   Hypothyroidism   Relevant Medications   levothyroxine (SYNTHROID, LEVOTHROID) 25 MCG tablet   Other Relevant Orders   TSH     Musculoskeletal and Integument   Osteoporosis   Relevant Medications   alendronate (FOSAMAX) 70 MG tablet   Other Relevant Orders   BMP8+EGFR     Genitourinary   BPH (benign prostatic hyperplasia)   Relevant Medications   tamsulosin (FLOMAX) 0.4 MG CAPS capsule   Other Relevant Orders   PSA, total and free     Other   Hyperlipidemia - Primary   Relevant Medications   ezetimibe-simvastatin (VYTORIN) 10-40 MG tablet   Other Relevant Orders   Lipid panel       Follow up plan: Return in about 6 months (around  03/01/2019), or if symptoms worsen or fail to improve, for Recheck thyroid and cholesterol.  Counseling provided for all of the vaccine components Orders Placed This Encounter  Procedures  . BMP8+EGFR  . CBC with Differential/Platelet  . Lipid panel  . PSA, total and free  . TSH    Michael Pina, MD [facility] 08/31/2018, 1:45 PM

## 2018-09-01 LAB — CBC WITH DIFFERENTIAL/PLATELET
BASOS: 0 %
Basophils Absolute: 0 10*3/uL (ref 0.0–0.2)
EOS (ABSOLUTE): 0.2 10*3/uL (ref 0.0–0.4)
Eos: 4 %
Hematocrit: 39.3 % (ref 37.5–51.0)
Hemoglobin: 13 g/dL (ref 13.0–17.7)
IMMATURE GRANS (ABS): 0 10*3/uL (ref 0.0–0.1)
Immature Granulocytes: 0 %
LYMPHS: 26 %
Lymphocytes Absolute: 1.2 10*3/uL (ref 0.7–3.1)
MCH: 30.7 pg (ref 26.6–33.0)
MCHC: 33.1 g/dL (ref 31.5–35.7)
MCV: 93 fL (ref 79–97)
Monocytes Absolute: 0.4 10*3/uL (ref 0.1–0.9)
Monocytes: 8 %
Neutrophils Absolute: 2.9 10*3/uL (ref 1.4–7.0)
Neutrophils: 62 %
PLATELETS: 155 10*3/uL (ref 150–450)
RBC: 4.24 x10E6/uL (ref 4.14–5.80)
RDW: 14 % (ref 12.3–15.4)
WBC: 4.6 10*3/uL (ref 3.4–10.8)

## 2018-09-01 LAB — TSH: TSH: 3.43 u[IU]/mL (ref 0.450–4.500)

## 2018-09-01 LAB — BMP8+EGFR
BUN / CREAT RATIO: 12 (ref 10–24)
BUN: 14 mg/dL (ref 8–27)
CHLORIDE: 97 mmol/L (ref 96–106)
CO2: 21 mmol/L (ref 20–29)
Calcium: 9 mg/dL (ref 8.6–10.2)
Creatinine, Ser: 1.14 mg/dL (ref 0.76–1.27)
GFR calc Af Amer: 68 mL/min/{1.73_m2} (ref >59)
GFR, EST NON AFRICAN AMERICAN: 59 mL/min/{1.73_m2} — AB (ref >59)
Glucose: 142 mg/dL — ABNORMAL HIGH (ref 65–99)
POTASSIUM: 4.2 mmol/L (ref 3.5–5.2)
Sodium: 134 mmol/L (ref 134–144)

## 2018-09-01 LAB — PSA, TOTAL AND FREE
PSA FREE: 0.31 ng/mL
PSA, Free Pct: 51.7 %
Prostate Specific Ag, Serum: 0.6 ng/mL (ref 0.0–4.0)

## 2018-09-01 LAB — LIPID PANEL
Chol/HDL Ratio: 2.9 ratio (ref 0.0–5.0)
Cholesterol, Total: 145 mg/dL (ref 100–199)
HDL: 50 mg/dL (ref >39)
LDL Calculated: 40 mg/dL (ref 0–99)
Triglycerides: 273 mg/dL — ABNORMAL HIGH (ref 0–149)
VLDL CHOLESTEROL CAL: 55 mg/dL — AB (ref 5–40)

## 2018-10-04 ENCOUNTER — Other Ambulatory Visit: Payer: Self-pay

## 2018-10-04 MED ORDER — CLOPIDOGREL BISULFATE 75 MG PO TABS: ORAL_TABLET | ORAL | 0 refills | Status: DC

## 2018-11-08 ENCOUNTER — Encounter: Payer: Self-pay | Admitting: Family Medicine

## 2018-11-08 ENCOUNTER — Ambulatory Visit: Payer: PRIVATE HEALTH INSURANCE | Admitting: Family Medicine

## 2018-11-08 VITALS — BP 145/71 | HR 71 | Temp 97.0°F | Ht 71.0 in | Wt 186.6 lb

## 2018-11-08 DIAGNOSIS — M67432 Ganglion, left wrist: Secondary | ICD-10-CM

## 2018-11-08 DIAGNOSIS — K219 Gastro-esophageal reflux disease without esophagitis: Secondary | ICD-10-CM | POA: Diagnosis not present

## 2018-11-08 DIAGNOSIS — E785 Hyperlipidemia, unspecified: Secondary | ICD-10-CM | POA: Diagnosis not present

## 2018-11-08 DIAGNOSIS — F419 Anxiety disorder, unspecified: Secondary | ICD-10-CM

## 2018-11-08 DIAGNOSIS — N400 Enlarged prostate without lower urinary tract symptoms: Secondary | ICD-10-CM

## 2018-11-08 LAB — CBC WITH DIFFERENTIAL/PLATELET
Basophils Absolute: 0.1 10*3/uL (ref 0.0–0.2)
Basos: 1 %
EOS (ABSOLUTE): 0.2 10*3/uL (ref 0.0–0.4)
Eos: 3 %
HEMOGLOBIN: 13.5 g/dL (ref 13.0–17.7)
Hematocrit: 40.6 % (ref 37.5–51.0)
IMMATURE GRANS (ABS): 0 10*3/uL (ref 0.0–0.1)
IMMATURE GRANULOCYTES: 0 %
LYMPHS: 23 %
Lymphocytes Absolute: 1.1 10*3/uL (ref 0.7–3.1)
MCH: 30.5 pg (ref 26.6–33.0)
MCHC: 33.3 g/dL (ref 31.5–35.7)
MCV: 92 fL (ref 79–97)
Monocytes Absolute: 0.5 10*3/uL (ref 0.1–0.9)
Monocytes: 10 %
NEUTROS ABS: 3.2 10*3/uL (ref 1.4–7.0)
Neutrophils: 63 %
Platelets: 161 10*3/uL (ref 150–450)
RBC: 4.42 x10E6/uL (ref 4.14–5.80)
RDW: 12.4 % (ref 12.3–15.4)
WBC: 5.1 10*3/uL (ref 3.4–10.8)

## 2018-11-08 LAB — CMP14+EGFR
ALT: 24 IU/L (ref 0–44)
AST: 32 IU/L (ref 0–40)
Albumin/Globulin Ratio: 1.8 (ref 1.2–2.2)
Albumin: 4.2 g/dL (ref 3.5–4.7)
Alkaline Phosphatase: 58 IU/L (ref 39–117)
BILIRUBIN TOTAL: 0.6 mg/dL (ref 0.0–1.2)
BUN / CREAT RATIO: 13 (ref 10–24)
BUN: 16 mg/dL (ref 8–27)
CALCIUM: 9.5 mg/dL (ref 8.6–10.2)
CHLORIDE: 101 mmol/L (ref 96–106)
CO2: 22 mmol/L (ref 20–29)
CREATININE: 1.24 mg/dL (ref 0.76–1.27)
GFR, EST AFRICAN AMERICAN: 61 mL/min/{1.73_m2} (ref >59)
GFR, EST NON AFRICAN AMERICAN: 53 mL/min/{1.73_m2} — AB (ref >59)
GLUCOSE: 107 mg/dL — AB (ref 65–99)
Globulin, Total: 2.3 g/dL (ref 1.5–4.5)
Potassium: 4.5 mmol/L (ref 3.5–5.2)
Sodium: 136 mmol/L (ref 134–144)
TOTAL PROTEIN: 6.5 g/dL (ref 6.0–8.5)

## 2018-11-08 MED ORDER — PANTOPRAZOLE SODIUM 40 MG PO TBEC: 40.0000 mg | DELAYED_RELEASE_TABLET | Freq: Every day | ORAL | 1 refills | Status: DC

## 2018-11-08 MED ORDER — LEVOTHYROXINE SODIUM 25 MCG PO TABS: ORAL_TABLET | ORAL | 1 refills | Status: DC

## 2018-11-08 MED ORDER — CITALOPRAM HYDROBROMIDE 20 MG PO TABS: 20.0000 mg | ORAL_TABLET | Freq: Every day | ORAL | 1 refills | Status: DC

## 2018-11-08 MED ORDER — GABAPENTIN 800 MG PO TABS: 800.0000 mg | ORAL_TABLET | Freq: Three times a day (TID) | ORAL | 1 refills | Status: DC

## 2018-11-08 MED ORDER — EZETIMIBE-SIMVASTATIN 10-40 MG PO TABS: ORAL_TABLET | ORAL | 1 refills | Status: DC

## 2018-11-08 MED ORDER — CLOPIDOGREL BISULFATE 75 MG PO TABS: ORAL_TABLET | ORAL | 1 refills | Status: DC

## 2018-11-08 MED ORDER — CELECOXIB 200 MG PO CAPS: 200.0000 mg | ORAL_CAPSULE | Freq: Two times a day (BID) | ORAL | 2 refills | Status: DC

## 2018-11-08 MED ORDER — CELECOXIB 100 MG PO CAPS: 100.0000 mg | ORAL_CAPSULE | Freq: Two times a day (BID) | ORAL | 5 refills | Status: DC

## 2018-11-08 MED ORDER — GABAPENTIN 800 MG PO TABS: 800.0000 mg | ORAL_TABLET | Freq: Two times a day (BID) | ORAL | 1 refills | Status: DC

## 2018-11-08 MED ORDER — TAMSULOSIN HCL 0.4 MG PO CAPS: 0.4000 mg | ORAL_CAPSULE | Freq: Every day | ORAL | 1 refills | Status: DC

## 2018-11-08 NOTE — Progress Notes (Signed)
Subjective:  Patient ID: Michael Mcclure, male    DOB: [birth year]  Age: 84 y.o. MRN: [id number]  CC: Cough (x 2 months) and Nasal Congestion   HPI Michael Mcclure presents for sx as above. However, changes topic to discuss lesion on left wrist. Getting bigger. Also has chronic back pain that is getting worse. Has increased his own gabapentin by adding a 600 mg tablet in the afternoon from an old supply. He has just been doing this for a couple of weeks and it isn't helping. Has perioheral neuropathy as well affecting RLE. Also concerned about increasing pain at left upper ext.   Patient in for follow-up of GERD. Currently asymptomatic taking  PPI daily. There is no chest pain or heartburn. No hematemesis and no melena. No dysphagia or choking. Onset is remote. Progression is stable. Complicating factors, none.   Depression/ anxiety stable on citalopram. See PHQ.  Depression screen [facility] 2/9 11/08/2018 08/31/2018 05/06/2018  Decreased Interest 0 0 0  Down, Depressed, Hopeless 0 0 0  PHQ - 2 Score 0 0 0  Altered sleeping - - -  Tired, decreased energy - - -  Change in appetite - - -  Feeling bad or failure about yourself  - - -  Trouble concentrating - - -  Moving slowly or fidgety/restless - - -  Suicidal thoughts - - -  PHQ-9 Score - - -  Some recent data might be hidden    History Cyler has a past medical history of Actinic keratitis (2017), Anxiety, Arthritis, BPH (benign prostatic hyperplasia), Cataract, GERD (gastroesophageal reflux disease), Hemorrhoids, HOH (hard of hearing), colonic polyps, Hyperlipidemia, Hypothyroidism, Keratosis, seborrheic (2017), Pelvic fracture ([place]), and Thyroid disease.   He has a past surgical history that includes Back surgery; Wrist surgery (Left); Shoulder surgery (Right); Rotator cuff repair; Carpal tunnel release (Right); Cataract extraction w/PHACO (Right, 05/18/2016); and Ankle surgery.   His family history includes Colon cancer in his mother;  Colon polyps in his mother; Diabetes in his daughter and son; Emphysema in his father and son; Heart disease in his brother, brother, and father.He reports that he has never smoked. He has never used smokeless tobacco. He reports that he does not drink alcohol or use drugs.    ROS Review of Systems  Constitutional: Negative.   HENT: Negative.   Eyes: Negative for visual disturbance.  Respiratory: Positive for cough. Negative for shortness of breath.   Cardiovascular: Negative for chest pain and leg swelling.  Gastrointestinal: Negative for abdominal pain, diarrhea, nausea and vomiting.  Genitourinary: Negative.   Musculoskeletal: Positive for arthralgias and back pain.  Skin: Negative for rash.  Neurological: Positive for numbness. Negative for headaches.  Psychiatric/Behavioral: Negative for sleep disturbance.    Objective:  BP (!) 145/71   Pulse 71   Temp (!) 97 F (36.1 C) (Oral)   Ht _0  (1.803 m)   Wt 186 lb 9.6 oz (84.6 kg)   SpO2 96%   BMI 26.03 kg/m   BP Readings from Last 3 Encounters:  11/08/18 (!) 145/71  08/31/18 123/61  05/06/18 101/64    Wt Readings from Last 3 Encounters:  11/08/18 186 lb 9.6 oz (84.6 kg)  08/31/18 186 lb (84.4 kg)  05/06/18 189 lb (85.7 kg)     Physical Exam  Constitutional: He appears well-developed and well-nourished.  HENT:  Head: Normocephalic and atraumatic.  Right Ear: Tympanic membrane and external ear normal. No decreased hearing is noted.  Left Ear: Tympanic membrane  and external ear normal. No decreased hearing is noted.  Mouth/Throat: No oropharyngeal exudate or posterior oropharyngeal erythema.  Eyes: Pupils are equal, round, and reactive to light.  Neck: Normal range of motion. Neck supple.  Cardiovascular: Normal rate and regular rhythm.  No murmur heard. Pulmonary/Chest: Breath sounds normal. No respiratory distress.  Abdominal: Soft. Bowel sounds are normal. He exhibits no mass. There is no tenderness.    Musculoskeletal: He exhibits tenderness (rubbery, fluctuant lesion dorsal left wrist.).  Vitals reviewed.     Assessment & Plan:   Jordan was seen today for cough and nasal congestion.  Diagnoses and all orders for this visit:  Hyperlipidemia, unspecified hyperlipidemia type -     CBC with Differential/Platelet -     CMP14+EGFR -     ezetimibe-simvastatin (VYTORIN) 10-40 MG tablet; TAKE 1 TABLET BY MOUTH EVERYDAY AT BEDTIME  Anxiety -     citalopram (CELEXA) 20 MG tablet; Take 1 tablet (20 mg total) by mouth daily.  Benign prostatic hyperplasia, unspecified whether lower urinary tract symptoms present -     tamsulosin (FLOMAX) 0.4 MG CAPS capsule; Take 1 capsule (0.4 mg total) by mouth daily.  Gastroesophageal reflux disease, esophagitis presence not specified -     pantoprazole (PROTONIX) 40 MG tablet; Take 1 tablet (40 mg total) by mouth daily.  Ganglion cyst of wrist, left -     Ambulatory referral to Orthopedics  Other orders -     Discontinue: celecoxib (CELEBREX) 100 MG capsule; Take 1 capsule (100 mg total) by mouth 2 (two) times daily. -     clopidogrel (PLAVIX) 75 MG tablet; TAKE 1 TABLET BY MOUTH EVERY DAY -     Discontinue: gabapentin (NEURONTIN) 800 MG tablet; Take 1 tablet (800 mg total) by mouth 2 (two) times daily. -     levothyroxine (SYNTHROID, LEVOTHROID) 25 MCG tablet; TAKE 1 TABLET (25 MCG TOTAL) BY MOUTH DAILY. -     celecoxib (CELEBREX) 200 MG capsule; Take 1 capsule (200 mg total) by mouth 2 (two) times daily. -     gabapentin (NEURONTIN) 800 MG tablet; Take 1 tablet (800 mg total) by mouth 3 (three) times daily. Replaces scrip for BID sent earlier today.       I have discontinued Arlana Hove. Velardi's celecoxib and gabapentin. I have also changed his celecoxib and gabapentin. Additionally, I am having him maintain his cholecalciferol, Flax Seed Oil, multivitamin with minerals, FLECTOR, alendronate, triamcinolone ointment, citalopram, tamsulosin,  pantoprazole, ezetimibe-simvastatin, clopidogrel, and levothyroxine.  Allergies as of 11/08/2018      Reactions   Hydrocodone Shortness Of Breath   Ciprofloxacin Other (See Comments)   Question caused short of breath.      Medication List        Accurate as of 11/08/18  9:26 PM. Always use your most recent med list.          alendronate 70 MG tablet Commonly known as:  FOSAMAX Take 1 tablet (70 mg total) by mouth every 7 (seven) days. Take with a full glass of water on an empty stomach.   celecoxib 200 MG capsule Commonly known as:  CELEBREX Take 1 capsule (200 mg total) by mouth 2 (two) times daily.   cholecalciferol 1000 units tablet Commonly known as:  VITAMIN D Take 1,000 Units by mouth daily.   citalopram 20 MG tablet Commonly known as:  CELEXA Take 1 tablet (20 mg total) by mouth daily.   clopidogrel 75 MG tablet Commonly known as:  PLAVIX TAKE  1 TABLET BY MOUTH EVERY DAY   ezetimibe-simvastatin 10-40 MG tablet Commonly known as:  VYTORIN TAKE 1 TABLET BY MOUTH EVERYDAY AT BEDTIME   Flax Seed Oil 1000 MG Caps Take 1 capsule by mouth daily.   FLECTOR 1.3 % Ptch Generic drug:  diclofenac PLACE 1 PATCH ONTO THE SKIN 2 (TWO) TIMES DAILY.   gabapentin 800 MG tablet Commonly known as:  NEURONTIN Take 1 tablet (800 mg total) by mouth 3 (three) times daily. Replaces scrip for BID sent earlier today.   levothyroxine 25 MCG tablet Commonly known as:  SYNTHROID, LEVOTHROID TAKE 1 TABLET (25 MCG TOTAL) BY MOUTH DAILY.   multivitamin with minerals tablet Take 1 tablet by mouth daily.   pantoprazole 40 MG tablet Commonly known as:  PROTONIX Take 1 tablet (40 mg total) by mouth daily.   tamsulosin 0.4 MG Caps capsule Commonly known as:  FLOMAX Take 1 capsule (0.4 mg total) by mouth daily.   triamcinolone ointment 0.5 % Commonly known as:  KENALOG Apply 1 application topically 2 (two) times daily as needed.        Follow-up: No follow-ups on  file.  Claretta Fraise, M.D.

## 2018-11-09 ENCOUNTER — Telehealth: Payer: Self-pay | Admitting: Family Medicine

## 2018-11-09 NOTE — Telephone Encounter (Signed)
Pt advised Dr Darlyn Readstacks did increase his Gabapentin by increasing to taking three times daily instead of two times daily. Pt voiced understanding.

## 2018-11-09 NOTE — Progress Notes (Signed)
Hello French Anaracy,  Your lab result is normal.Some minor variations that are not significant are commonly marked abnormal, but do not represent any medical problem for you.  Best regards, Mechele ClaudeWarren An Schnabel, M.D.

## 2018-11-28 ENCOUNTER — Other Ambulatory Visit: Payer: Self-pay | Admitting: *Deleted

## 2018-11-28 MED ORDER — DICLOFENAC EPOLAMINE 1.3 % TD PTCH: MEDICATED_PATCH | TRANSDERMAL | 0 refills | Status: DC

## 2018-12-16 ENCOUNTER — Ambulatory Visit (INDEPENDENT_AMBULATORY_CARE_PROVIDER_SITE_OTHER): Payer: Medicare Other

## 2018-12-16 ENCOUNTER — Encounter: Payer: Self-pay | Admitting: Family Medicine

## 2018-12-16 ENCOUNTER — Ambulatory Visit: Payer: Medicare Other | Admitting: Family Medicine

## 2018-12-16 VITALS — BP 119/62 | HR 74 | Temp 97.0°F | Ht 71.0 in | Wt 193.1 lb

## 2018-12-16 DIAGNOSIS — G629 Polyneuropathy, unspecified: Secondary | ICD-10-CM | POA: Diagnosis not present

## 2018-12-16 DIAGNOSIS — H903 Sensorineural hearing loss, bilateral: Secondary | ICD-10-CM | POA: Diagnosis not present

## 2018-12-16 DIAGNOSIS — M25572 Pain in left ankle and joints of left foot: Secondary | ICD-10-CM

## 2018-12-16 DIAGNOSIS — R059 Cough, unspecified: Secondary | ICD-10-CM

## 2018-12-16 DIAGNOSIS — R05 Cough: Secondary | ICD-10-CM | POA: Diagnosis not present

## 2018-12-16 MED ORDER — PREDNISONE 10 MG (48) PO TBPK: ORAL_TABLET | ORAL | 0 refills | Status: DC

## 2018-12-16 MED ORDER — CEFUROXIME AXETIL 250 MG PO TABS: 250.0000 mg | ORAL_TABLET | Freq: Two times a day (BID) | ORAL | 0 refills | Status: AC

## 2018-12-16 MED ORDER — GABAPENTIN 800 MG PO TABS: 1600.0000 mg | ORAL_TABLET | Freq: Two times a day (BID) | ORAL | 1 refills | Status: DC

## 2018-12-16 MED ORDER — AZITHROMYCIN 250 MG PO TABS: ORAL_TABLET | ORAL | 0 refills | Status: DC

## 2018-12-16 NOTE — Progress Notes (Addendum)
Subjective:  Patient ID: Michael Mcclure, male    DOB: November 11, 1932  Age: 85 y.o. MRN: [id number]  CC: Ankle Pain (pt here today c/o left ankle pain after getting it caught in the "storm door" and falling 2 weeks ago) and Sinus Problem (also c/o cough and congestion)   HPI Michael Mcclure presents for constant cough at night.  Gets bad when he lays down.  It is been going on since before he was here last month.  He is taking some mucus medication.  He is coughing up some mucus periodically.  I think he means Mucinex based on discussion.  He has had no fever no shortness of breath.  He is a non-smoker.  Patient fell about 2 weeks ago and twisted his ankle.  He is now having pain at the left ankle with ambulation.  He is able to bear weight.  No numbness or tingling.  Pain is mild to moderate.  Patient increase the gabapentin as we discussed.  However, he does not feel that there is adequate pain relief in the right lower extremity.  He describes a heaviness noted after walking for a while.  This is mostly in the upper right thigh.  The pain generally though is in the right lower leg.  Patient has a history of peripheral neuropathy in the right lower leg.  Depression screen [facility] 2/9 12/16/2018 11/08/2018 08/31/2018  Decreased Interest 0 0 0  Down, Depressed, Hopeless 0 0 0  PHQ - 2 Score 0 0 0  Altered sleeping - - -  Tired, decreased energy - - -  Change in appetite - - -  Feeling bad or failure about yourself  - - -  Trouble concentrating - - -  Moving slowly or fidgety/restless - - -  Suicidal thoughts - - -  PHQ-9 Score - - -  Some recent data might be hidden    History Michael Mcclure has a past medical history of Actinic keratitis (2017), Anxiety, Arthritis, BPH (benign prostatic hyperplasia), Cataract, GERD (gastroesophageal reflux disease), Hemorrhoids, HOH (hard of hearing), colonic polyps, Hyperlipidemia, Hypothyroidism, Keratosis, seborrheic (2017), Pelvic fracture (HCC), and Thyroid  disease.   He has a past surgical history that includes Back surgery; Wrist surgery (Left); Shoulder surgery (Right); Rotator cuff repair; Carpal tunnel release (Right); Cataract extraction w/PHACO (Right, 05/18/2016); and Ankle surgery.   His family history includes Colon cancer in his mother; Colon polyps in his mother; Diabetes in his daughter and son; Emphysema in his father and son; Heart disease in his brother, brother, and father.He reports that he has never smoked. He has never used smokeless tobacco. He reports that he does not drink alcohol or use drugs.    ROS Review of Systems  Constitutional: Positive for fatigue. Negative for fever.  HENT: Negative.   Eyes: Negative for visual disturbance.  Respiratory: Negative for cough and shortness of breath.   Cardiovascular: Negative for chest pain and leg swelling.  Gastrointestinal: Negative for abdominal pain, diarrhea, nausea and vomiting.  Genitourinary: Negative for difficulty urinating.  Musculoskeletal: Positive for arthralgias and myalgias.  Skin: Negative for rash.  Neurological: Negative for headaches.  Psychiatric/Behavioral: Negative for sleep disturbance.    Objective:  BP 119/62   Pulse 74   Temp (!) 97 F (36.1 C) (Oral)   Ht 5\' 11"  (1.803 m)   Wt 193 lb 2 oz (87.6 kg)   BMI 26.94 kg/m   BP Readings from Last 3 Encounters:  12/16/18 119/62  11/08/18 Marland Kitchen)  145/71  08/31/18 123/61    Wt Readings from Last 3 Encounters:  12/16/18 193 lb 2 oz (87.6 kg)  11/08/18 186 lb 9.6 oz (84.6 kg)  08/31/18 186 lb (84.4 kg)     Physical Exam Vitals signs reviewed.  Constitutional:      Appearance: He is well-developed.  HENT:     Head: Normocephalic and atraumatic.     Right Ear: Tympanic membrane and external ear normal. Decreased hearing noted.     Left Ear: Tympanic membrane and external ear normal. Decreased hearing noted.     Mouth/Throat:     Pharynx: No oropharyngeal exudate or posterior oropharyngeal  erythema.  Eyes:     Pupils: Pupils are equal, round, and reactive to light.  Neck:     Musculoskeletal: Normal range of motion and neck supple.  Cardiovascular:     Rate and Rhythm: Normal rate and regular rhythm.     Heart sounds: No murmur.  Pulmonary:     Effort: No respiratory distress.     Breath sounds: Normal breath sounds.  Abdominal:     General: Bowel sounds are normal.     Palpations: Abdomen is soft. There is no mass.     Tenderness: There is no abdominal tenderness.       Assessment & Plan:   Michael Mcclure was seen today for ankle pain and sinus problem.  Diagnoses and all orders for this visit:  Acute left ankle pain -     DG Ankle Complete Left; Future  Cough -     DG Chest 2 View; Future  Sensorineural hearing loss (SNHL) of both ears  Neuropathy  Other orders -     gabapentin (NEURONTIN) 800 MG tablet; Take 2 tablets (1,600 mg total) by mouth 2 (two) times daily. Replaces scrip for BID sent earlier today. -     cefUROXime (CEFTIN) 250 MG tablet; Take 1 tablet (250 mg total) by mouth 2 (two) times daily with a meal for 10 days. -     azithromycin (ZITHROMAX Z-PAK) 250 MG tablet; Take two right away Then one a day for the next 4 days. -     predniSONE (STERAPRED UNI-PAK 48 TAB) 10 MG (48) TBPK tablet; Use as directed       I have changed Michael Mcclure's gabapentin. I am also having him start on cefUROXime, azithromycin, and predniSONE. Additionally, I am having him maintain his cholecalciferol, Flax Seed Oil, multivitamin with minerals, alendronate, triamcinolone ointment, citalopram, tamsulosin, pantoprazole, ezetimibe-simvastatin, clopidogrel, levothyroxine, celecoxib, and diclofenac.  Allergies as of 12/16/2018      Reactions   Hydrocodone Shortness Of Breath   Ciprofloxacin Other (See Comments)   Question caused short of breath.      Medication List       Accurate as of December 16, 2018  3:13 PM. Always use your most recent med list.          alendronate 70 MG tablet Commonly known as:  FOSAMAX Take 1 tablet (70 mg total) by mouth every 7 (seven) days. Take with a full glass of water on an empty stomach.   azithromycin 250 MG tablet Commonly known as:  ZITHROMAX Z-PAK Take two right away Then one a day for the next 4 days.   cefUROXime 250 MG tablet Commonly known as:  CEFTIN Take 1 tablet (250 mg total) by mouth 2 (two) times daily with a meal for 10 days.   celecoxib 200 MG capsule Commonly known as:  CELEBREX Take  1 capsule (200 mg total) by mouth 2 (two) times daily.   cholecalciferol 1000 units tablet Commonly known as:  VITAMIN D Take 1,000 Units by mouth daily.   citalopram 20 MG tablet Commonly known as:  CELEXA Take 1 tablet (20 mg total) by mouth daily.   clopidogrel 75 MG tablet Commonly known as:  PLAVIX TAKE 1 TABLET BY MOUTH EVERY DAY   diclofenac 1.3 % Ptch Commonly known as:  FLECTOR PLACE 1 PATCH ONTO THE SKIN 2 (TWO) TIMES DAILY AS NEEDED FOR PAIN   ezetimibe-simvastatin 10-40 MG tablet Commonly known as:  VYTORIN TAKE 1 TABLET BY MOUTH EVERYDAY AT BEDTIME   Flax Seed Oil 1000 MG Caps Take 1 capsule by mouth daily.   gabapentin 800 MG tablet Commonly known as:  NEURONTIN Take 2 tablets (1,600 mg total) by mouth 2 (two) times daily. Replaces scrip for BID sent earlier today.   levothyroxine 25 MCG tablet Commonly known as:  SYNTHROID, LEVOTHROID TAKE 1 TABLET (25 MCG TOTAL) BY MOUTH DAILY.   multivitamin with minerals tablet Take 1 tablet by mouth daily.   pantoprazole 40 MG tablet Commonly known as:  PROTONIX Take 1 tablet (40 mg total) by mouth daily.   predniSONE 10 MG (48) Tbpk tablet Commonly known as:  STERAPRED UNI-PAK 48 TAB Use as directed   tamsulosin 0.4 MG Caps capsule Commonly known as:  FLOMAX Take 1 capsule (0.4 mg total) by mouth daily.   triamcinolone ointment 0.5 % Commonly known as:  KENALOG Apply 1 application topically 2 (two) times daily as needed.       ASO for left ankle  Follow-up: Return in about 6 weeks (around 01/27/2019).  Mechele ClaudeWarren Jaeven Wanzer, M.D.

## 2018-12-19 ENCOUNTER — Telehealth: Payer: Self-pay | Admitting: Family Medicine

## 2018-12-19 NOTE — Telephone Encounter (Signed)
2 caps BID - pt aware

## 2018-12-22 ENCOUNTER — Telehealth: Payer: Self-pay | Admitting: Family Medicine

## 2018-12-22 NOTE — Telephone Encounter (Signed)
Forward to PCP.

## 2018-12-23 NOTE — Telephone Encounter (Signed)
Yes, stop it today. Resume after surgery.

## 2018-12-23 NOTE — Telephone Encounter (Signed)
Pt son aware and will tell pt.

## 2018-12-23 NOTE — Telephone Encounter (Signed)
Pt has called again wanting to check on status

## 2018-12-29 ENCOUNTER — Other Ambulatory Visit: Payer: PRIVATE HEALTH INSURANCE

## 2019-01-17 ENCOUNTER — Telehealth: Payer: Self-pay | Admitting: Family Medicine

## 2019-01-24 DIAGNOSIS — M67432 Ganglion, left wrist: Secondary | ICD-10-CM | POA: Insufficient documentation

## 2019-01-26 ENCOUNTER — Other Ambulatory Visit: Payer: Self-pay | Admitting: Orthopedic Surgery

## 2019-01-26 ENCOUNTER — Ambulatory Visit (INDEPENDENT_AMBULATORY_CARE_PROVIDER_SITE_OTHER): Payer: PRIVATE HEALTH INSURANCE

## 2019-01-26 DIAGNOSIS — R52 Pain, unspecified: Secondary | ICD-10-CM

## 2019-01-26 DIAGNOSIS — M25532 Pain in left wrist: Secondary | ICD-10-CM

## 2019-01-27 ENCOUNTER — Ambulatory Visit: Payer: Medicare Other | Admitting: Family Medicine

## 2019-01-31 ENCOUNTER — Other Ambulatory Visit: Payer: Self-pay | Admitting: Family Medicine

## 2019-02-14 ENCOUNTER — Encounter: Payer: Self-pay | Admitting: Family Medicine

## 2019-02-14 ENCOUNTER — Ambulatory Visit (INDEPENDENT_AMBULATORY_CARE_PROVIDER_SITE_OTHER): Payer: PRIVATE HEALTH INSURANCE

## 2019-02-14 ENCOUNTER — Ambulatory Visit: Payer: PRIVATE HEALTH INSURANCE | Admitting: Family Medicine

## 2019-02-14 VITALS — BP 119/63 | HR 84 | Temp 97.3°F | Ht 71.0 in | Wt 194.1 lb

## 2019-02-14 DIAGNOSIS — M4126 Other idiopathic scoliosis, lumbar region: Secondary | ICD-10-CM | POA: Diagnosis not present

## 2019-02-14 DIAGNOSIS — M1611 Unilateral primary osteoarthritis, right hip: Secondary | ICD-10-CM

## 2019-02-14 DIAGNOSIS — H903 Sensorineural hearing loss, bilateral: Secondary | ICD-10-CM | POA: Diagnosis not present

## 2019-02-14 DIAGNOSIS — N183 Chronic kidney disease, stage 3 unspecified: Secondary | ICD-10-CM

## 2019-02-14 DIAGNOSIS — E039 Hypothyroidism, unspecified: Secondary | ICD-10-CM | POA: Diagnosis not present

## 2019-02-14 DIAGNOSIS — K219 Gastro-esophageal reflux disease without esophagitis: Secondary | ICD-10-CM

## 2019-02-14 DIAGNOSIS — N400 Enlarged prostate without lower urinary tract symptoms: Secondary | ICD-10-CM

## 2019-02-14 DIAGNOSIS — F419 Anxiety disorder, unspecified: Secondary | ICD-10-CM

## 2019-02-14 DIAGNOSIS — E785 Hyperlipidemia, unspecified: Secondary | ICD-10-CM

## 2019-02-14 MED ORDER — CITALOPRAM HYDROBROMIDE 20 MG PO TABS: 20.0000 mg | ORAL_TABLET | Freq: Every day | ORAL | 1 refills | Status: DC

## 2019-02-14 MED ORDER — PANTOPRAZOLE SODIUM 40 MG PO TBEC: 40.0000 mg | DELAYED_RELEASE_TABLET | Freq: Every day | ORAL | 1 refills | Status: DC

## 2019-02-14 MED ORDER — EZETIMIBE-SIMVASTATIN 10-40 MG PO TABS: ORAL_TABLET | ORAL | 1 refills | Status: DC

## 2019-02-14 MED ORDER — CLOPIDOGREL BISULFATE 75 MG PO TABS: ORAL_TABLET | ORAL | 1 refills | Status: DC

## 2019-02-14 MED ORDER — LEVOTHYROXINE SODIUM 25 MCG PO TABS: ORAL_TABLET | ORAL | 1 refills | Status: DC

## 2019-02-14 MED ORDER — CELECOXIB 200 MG PO CAPS: 200.0000 mg | ORAL_CAPSULE | Freq: Two times a day (BID) | ORAL | 1 refills | Status: DC

## 2019-02-14 MED ORDER — TAMSULOSIN HCL 0.4 MG PO CAPS: 0.4000 mg | ORAL_CAPSULE | Freq: Every day | ORAL | 1 refills | Status: DC

## 2019-02-14 NOTE — Progress Notes (Signed)
Subjective:  Patient ID: Michael Mcclure, male    DOB: 07/03/1932  Age: 85 y.o. MRN: [id number]  CC: Medical Management of Chronic Issues   HPI HEVER CASTILLEJA presents for right hip pain worsening. NKI. LAst XR Sept. 2018 - mild to moderate degenerative joint dx. (reviewed)   Patient presents for follow-up on  thyroid. The patient has a history of hypothyroidism for many years. It has been stable recently. Pt. denies any change in  voice, loss of hair, heat or cold intolerance. Energy level has been adequate to good. Patient denies constipation and diarrhea. No myxedema. Medication is as noted below. Verified that pt is taking it daily on an empty stomach. Well tolerated.  Patient in for follow-up of GERD. Currently asymptomatic taking  PPI daily. There is no chest pain or heartburn. No hematemesis and no melena. No dysphagia or choking. Onset is remote. Progression is stable. Complicating factors, none.    Depression screen [facility] 2/9 02/14/2019 12/16/2018 11/08/2018  Decreased Interest 0 0 0  Down, Depressed, Hopeless 0 0 0  PHQ - 2 Score 0 0 0  Altered sleeping - - -  Tired, decreased energy - - -  Change in appetite - - -  Feeling bad or failure about yourself  - - -  Trouble concentrating - - -  Moving slowly or fidgety/restless - - -  Suicidal thoughts - - -  PHQ-9 Score - - -  Some recent data might be hidden    History Chipper has a past medical history of Actinic keratitis (2017), Anxiety, Arthritis, BPH (benign prostatic hyperplasia), Cataract, GERD (gastroesophageal reflux disease), Hemorrhoids, HOH (hard of hearing), colonic polyps, Hyperlipidemia, Hypothyroidism, Keratosis, seborrheic (2017), Pelvic fracture ([place]), and Thyroid disease.   He has a past surgical history that includes Back surgery; Wrist surgery (Left); Shoulder surgery (Right); Rotator cuff repair; Carpal tunnel release (Right); Cataract extraction w/PHACO (Right, 05/18/2016); and Ankle surgery.   His family  history includes Colon cancer in his mother; Colon polyps in his mother; Diabetes in his daughter and son; Emphysema in his father and son; Heart disease in his brother, brother, and father.He reports that he has never smoked. He has never used smokeless tobacco. He reports that he does not drink alcohol or use drugs.    ROS Review of Systems  Constitutional: Negative.   HENT: Positive for hearing loss.   Eyes: Negative for visual disturbance.  Respiratory: Negative for cough and shortness of breath.   Cardiovascular: Negative for chest pain and leg swelling.  Gastrointestinal: Negative for abdominal pain, diarrhea, nausea and vomiting.  Genitourinary: Negative for difficulty urinating.  Musculoskeletal: Negative for arthralgias and myalgias.  Skin: Negative for rash.  Neurological: Negative for headaches.  Psychiatric/Behavioral: Negative for sleep disturbance.    Objective:  BP 119/63   Pulse 84   Temp (!) 97.3 F (36.3 C) (Oral)   Ht _0  (1.803 m)   Wt 194 lb 2 oz (88.1 kg)   BMI 27.07 kg/m   BP Readings from Last 3 Encounters:  02/14/19 119/63  12/16/18 119/62  11/08/18 (!) 145/71    Wt Readings from Last 3 Encounters:  02/14/19 194 lb 2 oz (88.1 kg)  12/16/18 193 lb 2 oz (87.6 kg)  11/08/18 186 lb 9.6 oz (84.6 kg)     Physical Exam Constitutional:      General: He is not in acute distress.    Appearance: He is well-developed.  HENT:     Head: Normocephalic and  atraumatic.     Right Ear: External ear normal.     Left Ear: External ear normal.     Nose: Nose normal.  Eyes:     Conjunctiva/sclera: Conjunctivae normal.     Pupils: Pupils are equal, round, and reactive to light.  Neck:     Musculoskeletal: Normal range of motion and neck supple.  Cardiovascular:     Rate and Rhythm: Normal rate and regular rhythm.     Heart sounds: Normal heart sounds. No murmur.  Pulmonary:     Effort: Pulmonary effort is normal. No respiratory distress.     Breath  sounds: Normal breath sounds. No wheezing or rales.  Abdominal:     Palpations: Abdomen is soft.     Tenderness: There is no abdominal tenderness.  Musculoskeletal: Normal range of motion.  Skin:    General: Skin is warm and dry.  Neurological:     Mental Status: He is alert and oriented to person, place, and time.     Deep Tendon Reflexes: Reflexes are normal and symmetric.  Psychiatric:        Behavior: Behavior normal.        Thought Content: Thought content normal.        Judgment: Judgment normal.    Results for orders placed or performed in visit on 02/14/19  CMP14+EGFR  Result Value Ref Range   Glucose 78 65 - 99 mg/dL   BUN 16 8 - 27 mg/dL   Creatinine, Ser 1.30 (H) 0.76 - 1.27 mg/dL   GFR calc non Af Amer 50 (L) >59 mL/min/1.73   GFR calc Af Amer 58 (L) >59 mL/min/1.73   BUN/Creatinine Ratio 12 10 - 24   Sodium 142 134 - 144 mmol/L   Potassium 4.6 3.5 - 5.2 mmol/L   Chloride 104 96 - 106 mmol/L   CO2 25 20 - 29 mmol/L   Calcium 9.6 8.6 - 10.2 mg/dL   Total Protein 6.1 6.0 - 8.5 g/dL   Albumin 4.2 3.6 - 4.6 g/dL   Globulin, Total 1.9 1.5 - 4.5 g/dL   Albumin/Globulin Ratio 2.2 1.2 - 2.2   Bilirubin Total 0.9 0.0 - 1.2 mg/dL   Alkaline Phosphatase 53 39 - 117 IU/L   AST 29 0 - 40 IU/L   ALT 18 0 - 44 IU/L  Lipid panel  Result Value Ref Range   Cholesterol, Total 152 100 - 199 mg/dL   Triglycerides 109 0 - 149 mg/dL   HDL 55 >39 mg/dL   VLDL Cholesterol Cal 22 5 - 40 mg/dL   LDL Calculated 75 0 - 99 mg/dL   Chol/HDL Ratio 2.8 0.0 - 5.0 ratio  TSH  Result Value Ref Range   TSH 2.650 0.450 - 4.500 uIU/mL  T4, Free  Result Value Ref Range   Free T4 1.26 0.82 - 1.77 ng/dL      Assessment & Plan:   Dominque was seen today for medical management of chronic issues.  Diagnoses and all orders for this visit:  Acquired hypothyroidism -     TSH -     T4, Free  CKD (chronic kidney disease) stage 3, GFR 30-59 ml/min (HCC)  Sensorineural hearing loss (SNHL) of  both ears  Other idiopathic scoliosis, lumbar region  Hyperlipidemia, unspecified hyperlipidemia type -     CMP14+EGFR -     Lipid panel -     ezetimibe-simvastatin (VYTORIN) 10-40 MG tablet; TAKE 1 TABLET BY MOUTH EVERYDAY AT BEDTIME  Gastroesophageal reflux disease,  esophagitis presence not specified -     pantoprazole (PROTONIX) 40 MG tablet; Take 1 tablet (40 mg total) by mouth daily.  Primary osteoarthritis of right hip -     DG HIP UNILAT W OR W/O PELVIS 2-3 VIEWS RIGHT; Future  Anxiety -     citalopram (CELEXA) 20 MG tablet; Take 1 tablet (20 mg total) by mouth daily.  Benign prostatic hyperplasia, unspecified whether lower urinary tract symptoms present -     tamsulosin (FLOMAX) 0.4 MG CAPS capsule; Take 1 capsule (0.4 mg total) by mouth daily.  Other orders -     celecoxib (CELEBREX) 200 MG capsule; Take 1 capsule (200 mg total) by mouth 2 (two) times daily. -     clopidogrel (PLAVIX) 75 MG tablet; TAKE 1 TABLET BY MOUTH EVERY DAY -     levothyroxine (SYNTHROID, LEVOTHROID) 25 MCG tablet; TAKE 1 TABLET (25 MCG TOTAL) BY MOUTH DAILY.       I have discontinued Arlana Hove. Demetrius's azithromycin and predniSONE. I have also changed his celecoxib. Additionally, I am having him maintain his cholecalciferol, Flax Seed Oil, multivitamin with minerals, alendronate, triamcinolone ointment, diclofenac, gabapentin, citalopram, tamsulosin, pantoprazole, ezetimibe-simvastatin, clopidogrel, and levothyroxine.  Allergies as of 02/14/2019      Reactions   Hydrocodone Shortness Of Breath   Ciprofloxacin Other (See Comments)   Question caused short of breath.      Medication List       Accurate as of February 14, 2019 11:59 PM. Always use your most recent med list.        alendronate 70 MG tablet Commonly known as:  FOSAMAX Take 1 tablet (70 mg total) by mouth every 7 (seven) days. Take with a full glass of water on an empty stomach.   celecoxib 200 MG capsule Commonly known as:   CELEBREX Take 1 capsule (200 mg total) by mouth 2 (two) times daily.   cholecalciferol 1000 units tablet Commonly known as:  VITAMIN D Take 1,000 Units by mouth daily.   citalopram 20 MG tablet Commonly known as:  CELEXA Take 1 tablet (20 mg total) by mouth daily.   clopidogrel 75 MG tablet Commonly known as:  PLAVIX TAKE 1 TABLET BY MOUTH EVERY DAY   diclofenac 1.3 % Ptch Commonly known as:  Flector PLACE 1 PATCH ONTO THE SKIN 2 (TWO) TIMES DAILY AS NEEDED FOR PAIN   ezetimibe-simvastatin 10-40 MG tablet Commonly known as:  VYTORIN TAKE 1 TABLET BY MOUTH EVERYDAY AT BEDTIME   Flax Seed Oil 1000 MG Caps Take 1 capsule by mouth daily.   gabapentin 800 MG tablet Commonly known as:  NEURONTIN Take 2 tablets (1,600 mg total) by mouth 2 (two) times daily. Replaces scrip for BID sent earlier today.   levothyroxine 25 MCG tablet Commonly known as:  SYNTHROID, LEVOTHROID TAKE 1 TABLET (25 MCG TOTAL) BY MOUTH DAILY.   multivitamin with minerals tablet Take 1 tablet by mouth daily.   pantoprazole 40 MG tablet Commonly known as:  PROTONIX Take 1 tablet (40 mg total) by mouth daily.   tamsulosin 0.4 MG Caps capsule Commonly known as:  FLOMAX Take 1 capsule (0.4 mg total) by mouth daily.   triamcinolone ointment 0.5 % Commonly known as:  KENALOG Apply 1 application topically 2 (two) times daily as needed.        Follow-up: Return in about 6 months (around 08/17/2019).  Claretta Fraise, M.D.

## 2019-02-15 ENCOUNTER — Telehealth: Payer: Self-pay | Admitting: *Deleted

## 2019-02-15 LAB — LIPID PANEL
Chol/HDL Ratio: 2.8 ratio (ref 0.0–5.0)
Cholesterol, Total: 152 mg/dL (ref 100–199)
HDL: 55 mg/dL (ref >39)
LDL Calculated: 75 mg/dL (ref 0–99)
Triglycerides: 109 mg/dL (ref 0–149)
VLDL Cholesterol Cal: 22 mg/dL (ref 5–40)

## 2019-02-15 LAB — CMP14+EGFR
ALT: 18 IU/L (ref 0–44)
AST: 29 IU/L (ref 0–40)
Albumin/Globulin Ratio: 2.2 (ref 1.2–2.2)
Albumin: 4.2 g/dL (ref 3.6–4.6)
Alkaline Phosphatase: 53 IU/L (ref 39–117)
BUN / CREAT RATIO: 12 (ref 10–24)
BUN: 16 mg/dL (ref 8–27)
Bilirubin Total: 0.9 mg/dL (ref 0.0–1.2)
CO2: 25 mmol/L (ref 20–29)
Calcium: 9.6 mg/dL (ref 8.6–10.2)
Chloride: 104 mmol/L (ref 96–106)
Creatinine, Ser: 1.3 mg/dL — ABNORMAL HIGH (ref 0.76–1.27)
GFR calc Af Amer: 58 mL/min/{1.73_m2} — ABNORMAL LOW (ref >59)
GFR calc non Af Amer: 50 mL/min/{1.73_m2} — ABNORMAL LOW (ref >59)
GLOBULIN, TOTAL: 1.9 g/dL (ref 1.5–4.5)
Glucose: 78 mg/dL (ref 65–99)
Potassium: 4.6 mmol/L (ref 3.5–5.2)
SODIUM: 142 mmol/L (ref 134–144)
Total Protein: 6.1 g/dL (ref 6.0–8.5)

## 2019-02-15 LAB — TSH: TSH: 2.65 u[IU]/mL (ref 0.450–4.500)

## 2019-02-15 LAB — T4, FREE: Free T4: 1.26 ng/dL (ref 0.82–1.77)

## 2019-02-15 NOTE — Telephone Encounter (Signed)
-----   Message from Mechele Claude, MD sent at 02/15/2019  4:31 PM EDT ----- Michael Mcclure, Your kidney function is somewhat weak but stable.  The rest of your labs appear normal Best Regards, Mechele Claude, M.D.

## 2019-02-18 ENCOUNTER — Encounter: Payer: Self-pay | Admitting: Family Medicine

## 2019-02-18 ENCOUNTER — Other Ambulatory Visit: Payer: Self-pay | Admitting: Family Medicine

## 2019-02-18 DIAGNOSIS — M810 Age-related osteoporosis without current pathological fracture: Secondary | ICD-10-CM

## 2019-03-01 ENCOUNTER — Ambulatory Visit: Payer: PRIVATE HEALTH INSURANCE | Admitting: Family Medicine

## 2019-05-18 ENCOUNTER — Encounter: Payer: Self-pay | Admitting: Family Medicine

## 2019-05-18 ENCOUNTER — Ambulatory Visit (INDEPENDENT_AMBULATORY_CARE_PROVIDER_SITE_OTHER): Payer: Medicare Other | Admitting: Family Medicine

## 2019-05-18 ENCOUNTER — Other Ambulatory Visit: Payer: Self-pay

## 2019-05-18 DIAGNOSIS — M810 Age-related osteoporosis without current pathological fracture: Secondary | ICD-10-CM | POA: Diagnosis not present

## 2019-05-18 DIAGNOSIS — E039 Hypothyroidism, unspecified: Secondary | ICD-10-CM

## 2019-05-18 DIAGNOSIS — E785 Hyperlipidemia, unspecified: Secondary | ICD-10-CM

## 2019-05-18 DIAGNOSIS — F419 Anxiety disorder, unspecified: Secondary | ICD-10-CM

## 2019-05-18 DIAGNOSIS — M1611 Unilateral primary osteoarthritis, right hip: Secondary | ICD-10-CM

## 2019-05-18 DIAGNOSIS — K219 Gastro-esophageal reflux disease without esophagitis: Secondary | ICD-10-CM

## 2019-05-18 MED ORDER — NABUMETONE 500 MG PO TABS: 1000.0000 mg | ORAL_TABLET | Freq: Two times a day (BID) | ORAL | 1 refills | Status: DC

## 2019-05-18 MED ORDER — ALENDRONATE SODIUM 70 MG PO TABS: 70.0000 mg | ORAL_TABLET | ORAL | 1 refills | Status: DC

## 2019-05-18 NOTE — Progress Notes (Signed)
Subjective:    Patient ID: Michael Mcclure, male    DOB: 11/16/1932, 85 y.o.   MRN: [id number]   HPI: Michael Mcclure is a 85 y.o. male presenting for patient presents for follow-up on  thyroid. The patient has a history of hypothyroidism for many years. It has been stable recently. Pt. denies any change in  voice, loss of hair, heat or cold intolerance. Energy level has been adequate to good. Patient denies constipation and diarrhea. No myxedema. Medication is as noted below. Verified that pt is taking it daily on an empty stomach. Well tolerated.  Patient in for follow-up of GERD. Currently asymptomatic taking  PPI daily. There is no chest pain or heartburn. No hematemesis and no melena. No dysphagia or choking. Onset is remote. Progression is stable. Complicating factors, none.  Patient also having pain from arthritis. Mostly right hip and ankle. No relieved with celebrex. He was diagnosed with osteoporosis after ankle fracture last year. Has notbeen taking fosamax. (Apparently the prescription wouldn't go through due to needing a diagnosis attached at the pharmacy.)   Depression screen [facility] 2/9 02/14/2019 12/16/2018 11/08/2018 08/31/2018 05/06/2018  Decreased Interest 0 0 0 0 0  Down, Depressed, Hopeless 0 0 0 0 0  PHQ - 2 Score 0 0 0 0 0  Altered sleeping - - - - -  Tired, decreased energy - - - - -  Change in appetite - - - - -  Feeling bad or failure about yourself  - - - - -  Trouble concentrating - - - - -  Moving slowly or fidgety/restless - - - - -  Suicidal thoughts - - - - -  PHQ-9 Score - - - - -  Some recent data might be hidden     Relevant past medical, surgical, family and social history reviewed and updated as indicated.  Interim medical history since our last visit reviewed. Allergies and medications reviewed and updated.  ROS:  Review of Systems  Constitutional: Negative.   HENT: Negative.   Eyes: Negative for visual disturbance.  Respiratory: Negative for  cough and shortness of breath.   Cardiovascular: Negative for chest pain and leg swelling.  Gastrointestinal: Negative for abdominal pain, diarrhea, nausea and vomiting.  Genitourinary: Negative for difficulty urinating.  Musculoskeletal: Positive for arthralgias (ankle, since fracture). Negative for myalgias.  Skin: Negative for rash.  Neurological: Negative for headaches.  Psychiatric/Behavioral: Negative for sleep disturbance.     Social History   Tobacco Use  Smoking Status Never Smoker  Smokeless Tobacco Never Used       Objective:     Wt Readings from Last 3 Encounters:  02/14/19 194 lb 2 oz (88.1 kg)  12/16/18 193 lb 2 oz (87.6 kg)  11/08/18 186 lb 9.6 oz (84.6 kg)     Exam deferred. Pt. Harboring due to COVID 19. Phone visit performed.   Assessment & Plan:   1. Acquired hypothyroidism   2. Age-related osteoporosis without current pathological fracture   3. Hyperlipidemia, unspecified hyperlipidemia type   4. Anxiety   5. Primary osteoarthritis of right hip   6. Gastroesophageal reflux disease, esophagitis presence not specified     Meds ordered this encounter  Medications  . alendronate (FOSAMAX) 70 MG tablet    Sig: Take 1 tablet (70 mg total) by mouth once a week. Take with a full glass of water on an empty stomach.    Dispense:  12 tablet    Refill:  1  DX Code :M81.0  . nabumetone (RELAFEN) 500 MG tablet    Sig: Take 2 tablets (1,000 mg total) by mouth 2 (two) times daily. For muscle and joint pain    Dispense:  360 tablet    Refill:  1    No orders of the defined types were placed in this encounter.     Diagnoses and all orders for this visit:  Acquired hypothyroidism  Age-related osteoporosis without current pathological fracture -     alendronate (FOSAMAX) 70 MG tablet; Take 1 tablet (70 mg total) by mouth once a week. Take with a full glass of water on an empty stomach.  Hyperlipidemia, unspecified hyperlipidemia type  Anxiety   Primary osteoarthritis of right hip  Gastroesophageal reflux disease, esophagitis presence not specified  Other orders -     nabumetone (RELAFEN) 500 MG tablet; Take 2 tablets (1,000 mg total) by mouth 2 (two) times daily. For muscle and joint pain    Virtual Visit via telephone Note  I discussed the limitations, risks, security and privacy concerns of performing an evaluation and management service by telephone and the availability of in person appointments. The patient was identified with two identifiers. Pt.expressed understanding and agreed to proceed. Pt. Is at home. Dr. Darlyn ReadStacks is in his office.  Follow Up Instructions:   I discussed the assessment and treatment plan with the patient. The patient was provided an opportunity to ask questions and all were answered. The patient agreed with the plan and demonstrated an understanding of the instructions.   The patient was advised to call back or seek an in-person evaluation if the symptoms worsen or if the condition fails to improve as anticipated.   Total minutes including chart review and phone contact time: 20   Follow up plan: Return in about 3 months (around 08/18/2019).  Michael ClaudeWarren Cianni Manny, MD [facility]

## 2019-06-22 ENCOUNTER — Other Ambulatory Visit: Payer: Self-pay | Admitting: Family Medicine

## 2019-08-03 ENCOUNTER — Ambulatory Visit (INDEPENDENT_AMBULATORY_CARE_PROVIDER_SITE_OTHER): Payer: No Typology Code available for payment source | Admitting: Family Medicine

## 2019-08-03 ENCOUNTER — Encounter: Payer: Self-pay | Admitting: Family Medicine

## 2019-08-03 DIAGNOSIS — R2689 Other abnormalities of gait and mobility: Secondary | ICD-10-CM | POA: Diagnosis not present

## 2019-08-03 DIAGNOSIS — J0100 Acute maxillary sinusitis, unspecified: Secondary | ICD-10-CM

## 2019-08-03 MED ORDER — AMOXICILLIN-POT CLAVULANATE 875-125 MG PO TABS: 1.0000 | ORAL_TABLET | Freq: Two times a day (BID) | ORAL | 0 refills | Status: DC

## 2019-08-03 NOTE — Progress Notes (Signed)
Subjective:    Patient ID: Michael Mcclure, male    DOB: 25-Oct-1932, 85 y.o.   MRN: [id number]   HPI: Michael Mcclure is a 85 y.o. male presenting for sinuses bothering him a lot. Voice feels heavy. Congested. Post nasal drainage and pressure in cheeks.  When I stand up I want to go backwards. (Due to poor balance.) Sometimes when trying to start walking.. Not falling. States he does not need a walker. Nofocal numbness or weakness, but feeling weak al over. Insidious onset.Able to ambulate with his cane.   Depression screen [facility] 2/9 02/14/2019 12/16/2018 11/08/2018 08/31/2018 05/06/2018  Decreased Interest 0 0 0 0 0  Down, Depressed, Hopeless 0 0 0 0 0  PHQ - 2 Score 0 0 0 0 0  Altered sleeping - - - - -  Tired, decreased energy - - - - -  Change in appetite - - - - -  Feeling bad or failure about yourself  - - - - -  Trouble concentrating - - - - -  Moving slowly or fidgety/restless - - - - -  Suicidal thoughts - - - - -  PHQ-9 Score - - - - -  Some recent data might be hidden     Relevant past medical, surgical, family and social history reviewed and updated as indicated.  Interim medical history since our last visit reviewed. Allergies and medications reviewed and updated.  ROS:  Review of Systems  Constitutional: Negative.  Negative for activity change, appetite change, chills and fever.  HENT: Positive for congestion, postnasal drip and sinus pressure. Negative for ear discharge, ear pain, hearing loss, nosebleeds, sneezing and trouble swallowing.   Eyes: Negative for visual disturbance.  Respiratory: Negative for cough, chest tightness and shortness of breath.   Cardiovascular: Negative for chest pain, palpitations and leg swelling.  Gastrointestinal: Negative for abdominal pain, diarrhea, nausea and vomiting.  Genitourinary: Negative for difficulty urinating.  Musculoskeletal: Negative for arthralgias and myalgias.  Skin: Negative for rash.  Neurological: Negative for  headaches.  Psychiatric/Behavioral: Negative for sleep disturbance.     Social History   Tobacco Use  Smoking Status Never Smoker  Smokeless Tobacco Never Used       Objective:     Wt Readings from Last 3 Encounters:  02/14/19 194 lb 2 oz (88.1 kg)  12/16/18 193 lb 2 oz (87.6 kg)  11/08/18 186 lb 9.6 oz (84.6 kg)     Exam deferred. Pt. Harboring due to COVID 19. Phone visit performed.   Assessment & Plan:   1. Impairment of balance   2. Acute maxillary sinusitis, recurrence not specified     Meds ordered this encounter  Medications  . amoxicillin-clavulanate (AUGMENTIN) 875-125 MG tablet    Sig: Take 1 tablet by mouth 2 (two) times daily. Take all of this medication    Dispense:  20 tablet    Refill:  0    Orders Placed This Encounter  Procedures  . Ambulatory referral to Physical Therapy    Referral Priority:   Routine    Referral Type:   Physical Medicine    Referral Reason:   Specialty Services Required    Requested Specialty:   Physical Therapy    Number of Visits Requested:   1      Diagnoses and all orders for this visit:  Impairment of balance -     Ambulatory referral to Physical Therapy  Acute maxillary sinusitis, recurrence not specified  Other orders -  amoxicillin-clavulanate (AUGMENTIN) 875-125 MG tablet; Take 1 tablet by mouth 2 (two) times daily. Take all of this medication    Virtual Visit via telephone Note  I discussed the limitations, risks, security and privacy concerns of performing an evaluation and management service by telephone and the availability of in person appointments. The patient was identified with two identifiers. Pt.expressed understanding and agreed to proceed. Pt. Is at home. Dr. Livia Snellen is in his office.  Follow Up Instructions:   I discussed the assessment and treatment plan with the patient. The patient was provided an opportunity to ask questions and all were answered. The patient agreed with the plan and  demonstrated an understanding of the instructions.   The patient was advised to call back or seek an in-person evaluation if the symptoms worsen or if the condition fails to improve as anticipated.   Total minutes including chart review and phone contact time: 22   Follow up plan: Return in about 6 weeks (around 09/14/2019).  Claretta Fraise, MD [place]

## 2019-08-08 ENCOUNTER — Encounter: Payer: Self-pay | Admitting: Physical Therapy

## 2019-08-08 ENCOUNTER — Other Ambulatory Visit: Payer: Self-pay

## 2019-08-08 ENCOUNTER — Ambulatory Visit: Payer: Medicare Other | Attending: Family Medicine | Admitting: Physical Therapy

## 2019-08-08 DIAGNOSIS — R262 Difficulty in walking, not elsewhere classified: Secondary | ICD-10-CM | POA: Insufficient documentation

## 2019-08-08 DIAGNOSIS — M5441 Lumbago with sciatica, right side: Secondary | ICD-10-CM | POA: Diagnosis present

## 2019-08-08 DIAGNOSIS — G8929 Other chronic pain: Secondary | ICD-10-CM | POA: Diagnosis present

## 2019-08-08 DIAGNOSIS — R296 Repeated falls: Secondary | ICD-10-CM | POA: Insufficient documentation

## 2019-08-08 DIAGNOSIS — M6281 Muscle weakness (generalized): Secondary | ICD-10-CM | POA: Diagnosis present

## 2019-08-08 NOTE — Therapy (Signed)
[facility] [address] [place], [place], [address] Phone: [phone]   Fax:  [phone]  Physical Therapy Evaluation  Patient Details  Name: Michael Mcclure MRN: [id number] Date of Birth: [birth year] Referring Provider (PT): Mechele ClaudeWarren Stacks, MD   Encounter Date: 08/08/2019  PT End of Session - 08/08/19 1109    Visit Number  1    Number of Visits  16    Date for PT Re-Evaluation  10/06/19    Authorization Type  Medicare KX modifier after visit 15    PT Start Time  1030    PT Stop Time  1115    PT Time Calculation (min)  45 min    Equipment Utilized During Treatment  Gait belt    Activity Tolerance  Patient tolerated treatment well    Behavior During Therapy  [facility] for tasks assessed/performed       Past Medical History:  Diagnosis Date  . Actinic keratitis 2017  . Anxiety   . Arthritis   . BPH (benign prostatic hyperplasia)   . Cataract   . GERD (gastroesophageal reflux disease)   . Hemorrhoids   . HOH (hard of hearing)   . Hx of colonic polyps   . Hyperlipidemia   . Hypothyroidism   . Keratosis, seborrheic 2017  . Pelvic fracture (HCC)   . Thyroid disease     Past Surgical History:  Procedure Laterality Date  . ANKLE SURGERY    . BACK SURGERY    . CARPAL TUNNEL RELEASE Right    x2  . CATARACT EXTRACTION W/PHACO Right 05/18/2016   Procedure: CATARACT EXTRACTION PHACO AND INTRAOCULAR LENS PLACEMENT RIGHT EYE; CDE:  5.35;  Surgeon: Gemma PayorKerry Hunt, MD;  Location: [facility];  Service: Ophthalmology;  Laterality: Right;  . ROTATOR CUFF REPAIR    . SHOULDER SURGERY Right    x 2   . WRIST SURGERY Left    fracture    There were no vitals filed for this visit.   Subjective Assessment - 08/08/19 1104    Subjective  Pt arriving to therpay reporting decreased balance and posteior lean when standing. Pt reporting he us unable to look up when walking or reach upward to hang his clothes in his closet. Pt also reporting low back pain  when walking or bending forward. pt reporting history of falls >/= 3. Pt currently amb with st cane in his R hand.    How long can you walk comfortably?  10 minutes    Patient Stated Goals  walk without hurting in my right side, stop losing my balance    Currently in Pain?  No/denies    Pain Score  --   no pain at present, but reports back pain is common   Pain Location  Back    Pain Orientation  Right;Lower    Pain Descriptors / Indicators  Aching    Pain Type  Chronic pain    Pain Radiating Towards  numbness reported in R thig    Pain Frequency  Intermittent    Aggravating Factors   walking, standing, bending    Pain Relieving Factors  resting    Effect of Pain on Daily Activities  difficulty with household chores         [facility] PT Assessment - 08/08/19 0001      Assessment   Medical Diagnosis  balance deficits, h/o falls    Referring Provider (PT)  Mechele ClaudeWarren Stacks, MD    Hand Dominance  Right  Prior Therapy  yes, several years ago      Precautions   Precautions  Fall      Restrictions   Weight Bearing Restrictions  No      Balance Screen   Has the patient fallen in the past 6 months  Yes    How many times?  3    Has the patient had a decrease in activity level because of a fear of falling?   Yes    Is the patient reluctant to leave their home because of a fear of falling?   No      Home Environment   Living Environment  Private residence    Living Arrangements  Children    Available Help at Discharge  Family    Home Access  Stairs to enter    Entrance Stairs-Number of Steps  4    Entrance Stairs-Rails  Right    Home Layout  One level      Prior Function   Level of Independence  Independent    Leisure  Pt reports he walks about 30 minutes each day when it's not raining around his neighborhood      Cognition   Overall Cognitive Status  Within Functional Limits for tasks assessed      Observation/Other Assessments   Focus on Therapeutic Outcomes (FOTO)    deferred due to balance deficits      Posture/Postural Control   Posture/Postural Control  Postural limitations    Postural Limitations  Rounded Shoulders;Forward head;Decreased lumbar lordosis      ROM / Strength   AROM / PROM / Strength  AROM;Strength      AROM   Overall AROM Comments  limited lumbar rotation due to pain on right side, Pt loses his balance when performing trunk rotation in standing.       Strength   Strength Assessment Site  Hip    Right/Left Hip  Right;Left    Right Hip Flexion  4+/5    Right Hip ABduction  4/5    Right Hip ADduction  4/5    Left Hip Flexion  4/5    Left Hip ABduction  4/5    Left Hip ADduction  4/5      Palpation   Palpation comment  TTP in lumbar paraspinals      Transfers   Five time sit to stand comments   18.67 seconds using UE support      Ambulation/Gait   Ambulation Distance (Feet)  50 Feet    Assistive device  Straight cane    Gait Pattern  Step-through pattern;Decreased step length - right;Decreased step length - left;Poor foot clearance - left;Poor foot clearance - right      Standardized Balance Assessment   Standardized Balance Assessment  Berg Balance Test      Berg Balance Test   Sit to Stand  Able to stand  independently using hands    Standing Unsupported  Able to stand safely 2 minutes    Sitting with Back Unsupported but Feet Supported on Floor or Stool  Able to sit safely and securely 2 minutes    Stand to Sit  Sits safely with minimal use of hands    Transfers  Able to transfer safely, definite need of hands    Standing Unsupported with Eyes Closed  Unable to keep eyes closed 3 seconds but stays steady    Standing Unsupported with Feet Together  Needs help to attain position but able to stand for  30 seconds with feet together    From Standing, Reach Forward with Outstretched Arm  Can reach forward >12 cm safely (5")    From Standing Position, Pick up Object from Floor  Able to pick up shoe, needs supervision     From Standing Position, Turn to Look Behind Over each Shoulder  Needs supervision when turning    Turn 360 Degrees  Needs close supervision or verbal cueing    Standing Unsupported, Alternately Place Feet on Step/Stool  Able to complete 4 steps without aid or supervision    Standing Unsupported, One Foot in Front  Loses balance while stepping or standing    Standing on One Leg  Unable to try or needs assist to prevent fall    Total Score  30                Objective measurements completed on examination: See above findings.      [facility]/Exercise - 08/08/19 0001      Exercises   Exercises  Knee/Hip      Knee/Hip Exercises: Standing   Heel Raises  Right;Left;15 reps    Hip Flexion  AROM;Stengthening;Both;15 reps    Hip ADduction  AROM;Strengthening;Both;15 reps      Knee/Hip Exercises: Seated   Sit to Sand  5 reps;with UE support             PT Education - 08/08/19 1109    Education Details  HEP,    Person(s) Educated  Patient    Methods  Explanation;Demonstration    Comprehension  Verbalized understanding;Returned demonstration          PT Long Term Goals - 08/08/19 1110      PT LONG TERM GOAL #1   Title  Pt will be independent in his HEP and progression.    Baseline  issued standing hip exercises today    Time  8    Period  Weeks    Status  New      PT LONG TERM GOAL #2   Title  Pt will improve his BERG balance score from 30/56 to >/= 40/56.    Baseline  30/56 on 08/08/2019    Time  8    Period  Weeks    Status  New    Target Date  10/06/19      PT LONG TERM GOAL #3   Title  Pt will be able to perform 5 times sit to stand with no UE support in </= 15 seconds.    Baseline  18.67 seconds using UE support    Time  8    Period  Weeks    Status  New    Target Date  10/06/19      PT LONG TERM GOAL #4   Title  Pt will be able to walk 20 minutes with pain in his low back of </=2/10.    Time  8    Period  Weeks    Status  New     Target Date  10/06/19             Plan - 08/08/19 1116    Clinical Impression Statement  Pt presents with chronic low back pain with difficutly with dynamic balance. BERG balance score of 30/56. Pt amb with a straight cane with reports of several LOB posteriorly. Pt with LOB with mild pertubations fowrad and back while standing. Pt's bilateral LE grossly 4/5 strength. Pt reporting pain with amb down R  lumbar spine and into R hip/thigh. Pt could benefit from skilled PT to address pt's impairments with the below interventions.    Personal Factors and Comorbidities  Age;Comorbidity 3+    Comorbidities  anxiety, HOH, hypothyroidism, pelvic fx, BPH, cataracts, R ankle surgery, carpel tunner, R rotator cuff surgery, L wrist fx, OA, lumbar surgery    Examination-Activity Limitations  Squat;Stairs;Stand;Lift    Examination-Participation Restrictions  Community Activity;Laundry;Yard Work    Stability/Clinical Decision Making  Stable/Uncomplicated    Clinical Decision Making  Moderate    Rehab Potential  Good    PT Frequency  2x / week    PT Duration  8 weeks    PT Treatment/Interventions  ADLs/Self Care Home Management;Cryotherapy;Electrical Stimulation;Moist Heat;Ultrasound;Gait training;Stair training;Functional mobility training;Therapeutic activities;Therapeutic exercise;Balance training;Neuromuscular re-education;Patient/family education;Manual techniques;Passive range of motion    PT Next Visit Plan  balance exercises, lumbar stretching, strengthening    PT Home Exercise Plan  heel raises, hip abd, marching all with UE support    Consulted and Agree with Plan of Care  Patient       Patient will benefit from skilled therapeutic intervention in order to improve the following deficits and impairments:  Abnormal gait, Pain, Decreased mobility, Decreased strength, Postural dysfunction, Decreased activity tolerance, Impaired flexibility, Decreased balance  Visit Diagnosis: Difficulty in  walking, not elsewhere classified  Repeated falls  Muscle weakness (generalized)  Chronic right-sided low back pain with right-sided sciatica     Problem List Patient Active Problem List   Diagnosis Date Noted  . Impairment of balance 08/03/2019  . Osteoporosis 01/19/2017  . Osteoarthritis of hip 11/02/2016  . CKD (chronic kidney disease) stage 3, GFR 30-59 ml/min (HCC) 10/02/2016  . Venous insufficiency 05/11/2016  . Dizziness 02/07/2016  . Sensorineural hearing loss (SNHL) of both ears 10/10/2015  . Hypothyroidism 09/20/2014  . Neuropathy 02/14/2014  . Lumbar scoliosis 02/14/2014  . Anterolisthesis 02/14/2014  . GERD (gastroesophageal reflux disease) 03/19/2011  . Hyperlipidemia 03/19/2011  . Neurogenic bladder 03/19/2011  . BPH (benign prostatic hyperplasia) 03/19/2011  . Arthritis 12/06/2008  . Back pain 12/06/2008  . CARPAL TUNNEL SYNDROME, HX OF 12/06/2008    Oretha Caprice, PT 08/08/2019, 11:25 AM  [facility] [place], [place], [address] Phone: [phone]   Fax:  [phone]  Name: Michael Mcclure MRN: [id number] Date of Birth: 01/18/1932

## 2019-08-10 ENCOUNTER — Other Ambulatory Visit: Payer: Self-pay

## 2019-08-10 ENCOUNTER — Encounter: Payer: Self-pay | Admitting: Physical Therapy

## 2019-08-10 ENCOUNTER — Ambulatory Visit: Payer: Medicare Other | Admitting: Physical Therapy

## 2019-08-10 DIAGNOSIS — M6281 Muscle weakness (generalized): Secondary | ICD-10-CM

## 2019-08-10 DIAGNOSIS — R296 Repeated falls: Secondary | ICD-10-CM

## 2019-08-10 DIAGNOSIS — M5441 Lumbago with sciatica, right side: Secondary | ICD-10-CM

## 2019-08-10 DIAGNOSIS — R262 Difficulty in walking, not elsewhere classified: Secondary | ICD-10-CM | POA: Diagnosis not present

## 2019-08-10 DIAGNOSIS — G8929 Other chronic pain: Secondary | ICD-10-CM

## 2019-08-10 NOTE — Therapy (Signed)
[facility] [place], [place], [address] Phone: [phone]   Fax:  [phone]  Physical Therapy Treatment  Patient Details  Name: Michael Mcclure MRN: [id number] Date of Birth: 06-07-1932 Referring Provider (PT): Claretta Fraise, MD   Encounter Date: 08/10/2019  PT End of Session - 08/10/19 1122    Visit Number  2    Number of Visits  16    Date for PT Re-Evaluation  10/06/19    Authorization Type  Medicare KX modifier after visit 15    PT Start Time  1030    PT Stop Time  1115    PT Time Calculation (min)  45 min    Equipment Utilized During Treatment  Gait belt    Activity Tolerance  Patient tolerated treatment well    Behavior During Therapy  [facility] for tasks assessed/performed       Past Medical History:  Diagnosis Date  . Actinic keratitis 2017  . Anxiety   . Arthritis   . BPH (benign prostatic hyperplasia)   . Cataract   . GERD (gastroesophageal reflux disease)   . Hemorrhoids   . HOH (hard of hearing)   . Hx of colonic polyps   . Hyperlipidemia   . Hypothyroidism   . Keratosis, seborrheic 2017  . Pelvic fracture ([place])   . Thyroid disease     Past Surgical History:  Procedure Laterality Date  . ANKLE SURGERY    . BACK SURGERY    . CARPAL TUNNEL RELEASE Right    x2  . CATARACT EXTRACTION W/PHACO Right 05/18/2016   Procedure: CATARACT EXTRACTION PHACO AND INTRAOCULAR LENS PLACEMENT RIGHT EYE; CDE:  5.35;  Surgeon: Tonny Branch, MD;  Location: [facility];  Service: Ophthalmology;  Laterality: Right;  . ROTATOR CUFF REPAIR    . SHOULDER SURGERY Right    x 2   . WRIST SURGERY Left    fracture    There were no vitals filed for this visit.                    [facility] Adult PT Treatment/Exercise - 08/10/19 0001      Exercises   Exercises  Knee/Hip      Knee/Hip Exercises: Standing   Heel Raises  Right;Left;15 reps    Hip Flexion  AROM;Stengthening;Both;15 reps    Hip Abduction   Stengthening;Both;2 sets;10 reps    Rocker Board  3 minutes;Limitations    Rocker Board Limitations  UE support, lateral rocking x 2 minutes    Other Standing Knee Exercises  tandum stance x 30 seocnds with CGA to reach position and CGA to maintain balance.       Knee/Hip Exercises: Seated   Other Seated Knee/Hip Exercises  tapping heels on 6 inch step x 20 reps with UE support    Other Seated Knee/Hip Exercises  standing on airex mat x 2 minutes with intermittent CGA    Marching  Strengthening;Both;20 reps    Hamstring Curl  AROM;Strengthening;Both;15 reps    Sit to Sand  10 reps;without UE support                  PT Long Term Goals - 08/10/19 1125      PT LONG TERM GOAL #1   Title  Pt will be independent in his HEP and progression.    Baseline  issued standing hip exercises today    Time  8    Period  Weeks  Status  New      PT LONG TERM GOAL #2   Title  Pt will improve his BERG balance score from 30/56 to >/= 40/56.    Baseline  30/56 on 08/08/2019    Time  8    Period  Weeks    Status  New      PT LONG TERM GOAL #3   Title  Pt will be able to perform 5 times sit to stand with no UE support in </= 15 seconds.    Baseline  18.67 seconds using UE support    Time  8    Period  Weeks    Status  New      PT LONG TERM GOAL #4   Title  Pt will be able to walk 20 minutes with pain in his low back of </=2/10.    Time  8    Period  Weeks    Status  New            Plan - 08/10/19 1122    Clinical Impression Statement  Pt tolreating exercises well requiring CGA and gait belt for balance exercises. Pt with limited R ankle DF. Skilled PT needs to address pt's impairments to progress toward LTG's set.    Personal Factors and Comorbidities  Age;Comorbidity 3+    Comorbidities  anxiety, HOH, hypothyroidism, pelvic fx, BPH, cataracts, R ankle surgery, carpel tunner, R rotator cuff surgery, L wrist fx, OA, lumbar surgery    Examination-Activity Limitations   Squat;Stairs;Stand;Lift    Examination-Participation Restrictions  Community Activity;Laundry;Yard Work    Stability/Clinical Decision Making  Stable/Uncomplicated    Rehab Potential  Good    PT Frequency  2x / week    PT Duration  8 weeks    PT Treatment/Interventions  ADLs/Self Care Home Management;Cryotherapy;Electrical Stimulation;Moist Heat;Ultrasound;Gait training;Stair training;Functional mobility training;Therapeutic activities;Therapeutic exercise;Balance training;Neuromuscular re-education;Patient/family education;Manual techniques;Passive range of motion    PT Next Visit Plan  balance exercises, lumbar stretching, strengthening    PT Home Exercise Plan  heel raises, hip abd, marching all with UE support    Consulted and Agree with Plan of Care  Patient       Patient will benefit from skilled therapeutic intervention in order to improve the following deficits and impairments:  Abnormal gait, Pain, Decreased mobility, Decreased strength, Postural dysfunction, Decreased activity tolerance, Impaired flexibility, Decreased balance  Visit Diagnosis: Difficulty in walking, not elsewhere classified  Repeated falls  Muscle weakness (generalized)  Chronic right-sided low back pain with right-sided sciatica     Problem List Patient Active Problem List   Diagnosis Date Noted  . Impairment of balance 08/03/2019  . Osteoporosis 01/19/2017  . Osteoarthritis of hip 11/02/2016  . CKD (chronic kidney disease) stage 3, GFR 30-59 ml/min (HCC) 10/02/2016  . Venous insufficiency 05/11/2016  . Dizziness 02/07/2016  . Sensorineural hearing loss (SNHL) of both ears 10/10/2015  . Hypothyroidism 09/20/2014  . Neuropathy 02/14/2014  . Lumbar scoliosis 02/14/2014  . Anterolisthesis 02/14/2014  . GERD (gastroesophageal reflux disease) 03/19/2011  . Hyperlipidemia 03/19/2011  . Neurogenic bladder 03/19/2011  . BPH (benign prostatic hyperplasia) 03/19/2011  . Arthritis 12/06/2008  . Back  pain 12/06/2008  . CARPAL TUNNEL SYNDROME, HX OF 12/06/2008    Sharmon LeydenJennifer R Clerance Umland, PT 08/10/2019, 11:27 AM  [facility] [address] [place], [place], [address] Phone: [phone]   Fax:  [phone]  Name: Michael Mcclure MRN: [id number] Date of Birth: 17-Oct-1932

## 2019-08-15 ENCOUNTER — Ambulatory Visit: Payer: Medicare Other | Admitting: *Deleted

## 2019-08-15 ENCOUNTER — Other Ambulatory Visit: Payer: Self-pay

## 2019-08-15 DIAGNOSIS — R262 Difficulty in walking, not elsewhere classified: Secondary | ICD-10-CM | POA: Diagnosis not present

## 2019-08-15 DIAGNOSIS — G8929 Other chronic pain: Secondary | ICD-10-CM

## 2019-08-15 DIAGNOSIS — M6281 Muscle weakness (generalized): Secondary | ICD-10-CM

## 2019-08-15 DIAGNOSIS — M5441 Lumbago with sciatica, right side: Secondary | ICD-10-CM

## 2019-08-15 DIAGNOSIS — R296 Repeated falls: Secondary | ICD-10-CM

## 2019-08-15 NOTE — Therapy (Signed)
[facility] [address] [place], [place], [address] Phone: [phone]   Fax:  [phone]  Physical Therapy Treatment  Patient Details  Name: Michael Mcclure J Nakayama MRN: [id number] Date of Birth: 08-25-1932 Referring Provider (PT): Mechele ClaudeWarren Stacks, MD   Encounter Date: 08/15/2019  PT End of Session - 08/15/19 1304    Visit Number  3    Number of Visits  16    Date for PT Re-Evaluation  10/06/19    Authorization Type  Medicare KX modifier after visit 15    PT Start Time  1300    Equipment Utilized During Treatment  Gait belt    Activity Tolerance  Patient tolerated treatment well    Behavior During Therapy  [facility] for tasks assessed/performed       Past Medical History:  Diagnosis Date  . Actinic keratitis 2017  . Anxiety   . Arthritis   . BPH (benign prostatic hyperplasia)   . Cataract   . GERD (gastroesophageal reflux disease)   . Hemorrhoids   . HOH (hard of hearing)   . Hx of colonic polyps   . Hyperlipidemia   . Hypothyroidism   . Keratosis, seborrheic 2017  . Pelvic fracture (HCC)   . Thyroid disease     Past Surgical History:  Procedure Laterality Date  . ANKLE SURGERY    . BACK SURGERY    . CARPAL TUNNEL RELEASE Right    x2  . CATARACT EXTRACTION W/PHACO Right 05/18/2016   Procedure: CATARACT EXTRACTION PHACO AND INTRAOCULAR LENS PLACEMENT RIGHT EYE; CDE:  5.35;  Surgeon: Gemma PayorKerry Hunt, MD;  Location: [facility];  Service: Ophthalmology;  Laterality: Right;  . ROTATOR CUFF REPAIR    . SHOULDER SURGERY Right    x 2   . WRIST SURGERY Left    fracture    There were no vitals filed for this visit.  Subjective Assessment - 08/15/19 1300    Subjective  COVID19 screening performed prior to entering the building. Did good after last Rx.    How long can you walk comfortably?  10 minutes    Patient Stated Goals  walk without hurting in my right side, stop losing my balance                       [facility] Adult PT  Treatment/Exercise - 08/15/19 0001      Exercises   Exercises  Knee/Hip      Knee/Hip Exercises: Aerobic   Nustep  L4 x 10 mins       Knee/Hip Exercises: Standing   Heel Raises  Right;Left;15 reps    Hip Flexion  AROM;Stengthening;Both;15 reps    Hip Abduction  Stengthening;Both;2 sets;10 reps    Rocker Board  3 minutes;Limitations    Rocker Board Limitations  UE support, lateral rocking x 2 minutes    Other Standing Knee Exercises  tandum stance x 30 seocnds with CGA to reach position and CGA to maintain balance.       Knee/Hip Exercises: Seated   Ball Squeeze  x10 hold 5 secs    Clamshell with TheraBand  Red    Other Seated Knee/Hip Exercises  tapping heels on 6 inch step x 20 reps with UE support    Other Seated Knee/Hip Exercises  standing on airex mat x 2 minutes with intermittent CGA    Hamstring Curl  --    Sit to Sand  10 reps;without UE support   on mat  PT Long Term Goals - 08/10/19 1125      PT LONG TERM GOAL #1   Title  Pt will be independent in his HEP and progression.    Baseline  issued standing hip exercises today    Time  8    Period  Weeks    Status  New      PT LONG TERM GOAL #2   Title  Pt will improve his BERG balance score from 30/56 to >/= 40/56.    Baseline  30/56 on 08/08/2019    Time  8    Period  Weeks    Status  New      PT LONG TERM GOAL #3   Title  Pt will be able to perform 5 times sit to stand with no UE support in </= 15 seconds.    Baseline  18.67 seconds using UE support    Time  8    Period  Weeks    Status  New      PT LONG TERM GOAL #4   Title  Pt will be able to walk 20 minutes with pain in his low back of </=2/10.    Time  8    Period  Weeks    Status  New            Plan - 08/15/19 1336    Clinical Impression Statement  Pt arrived today feeling fairly well with balance and  LE strengthening exs, but continues to need CGA/SBA durng balance exs. Pt had LOB 3x while on airex pad and would  need assistace to regain. V/Cs to lean forward to avoid falling back during sit to stand ex.    Comorbidities  anxiety, HOH, hypothyroidism, pelvic fx, BPH, cataracts, R ankle surgery, carpel tunner, R rotator cuff surgery, L wrist fx, OA, lumbar surgery    Examination-Activity Limitations  Squat;Stairs;Stand;Lift    Examination-Participation Restrictions  Community Activity;Laundry;Yard Work    Rehab Potential  Good    PT Treatment/Interventions  ADLs/Self Care Home Management;Cryotherapy;Electrical Stimulation;Moist Heat;Ultrasound;Gait training;Stair training;Functional mobility training;Therapeutic activities;Therapeutic exercise;Balance training;Neuromuscular re-education;Patient/family education;Manual techniques;Passive range of motion    PT Next Visit Plan  balance exercises, lumbar stretching, strengthening    PT Home Exercise Plan  heel raises, hip abd, marching all with UE support    Consulted and Agree with Plan of Care  Patient       Patient will benefit from skilled therapeutic intervention in order to improve the following deficits and impairments:  Abnormal gait, Pain, Decreased mobility, Decreased strength, Postural dysfunction, Decreased activity tolerance, Impaired flexibility, Decreased balance  Visit Diagnosis: Difficulty in walking, not elsewhere classified  Repeated falls  Muscle weakness (generalized)  Chronic right-sided low back pain with right-sided sciatica     Problem List Patient Active Problem List   Diagnosis Date Noted  . Impairment of balance 08/03/2019  . Osteoporosis 01/19/2017  . Osteoarthritis of hip 11/02/2016  . CKD (chronic kidney disease) stage 3, GFR 30-59 ml/min (HCC) 10/02/2016  . Venous insufficiency 05/11/2016  . Dizziness 02/07/2016  . Sensorineural hearing loss (SNHL) of both ears 10/10/2015  . Hypothyroidism 09/20/2014  . Neuropathy 02/14/2014  . Lumbar scoliosis 02/14/2014  . Anterolisthesis 02/14/2014  . GERD  (gastroesophageal reflux disease) 03/19/2011  . Hyperlipidemia 03/19/2011  . Neurogenic bladder 03/19/2011  . BPH (benign prostatic hyperplasia) 03/19/2011  . Arthritis 12/06/2008  . Back pain 12/06/2008  . CARPAL TUNNEL SYNDROME, HX OF 12/06/2008    Jamille Yoshino,CHRIS, PTA 08/15/2019, 1:58 PM  [facility] [place], [place], [address] Phone: [phone]   Fax:  [phone]  Name: MIHAIL PRETTYMAN MRN: [id number] Date of Birth: Dec 04, 1932

## 2019-08-17 ENCOUNTER — Other Ambulatory Visit: Payer: Self-pay

## 2019-08-17 ENCOUNTER — Encounter: Payer: Self-pay | Admitting: Physical Therapy

## 2019-08-17 ENCOUNTER — Ambulatory Visit: Payer: Medicare Other | Admitting: Physical Therapy

## 2019-08-17 DIAGNOSIS — M5441 Lumbago with sciatica, right side: Secondary | ICD-10-CM

## 2019-08-17 DIAGNOSIS — R262 Difficulty in walking, not elsewhere classified: Secondary | ICD-10-CM

## 2019-08-17 DIAGNOSIS — M6281 Muscle weakness (generalized): Secondary | ICD-10-CM

## 2019-08-17 DIAGNOSIS — R296 Repeated falls: Secondary | ICD-10-CM

## 2019-08-17 DIAGNOSIS — G8929 Other chronic pain: Secondary | ICD-10-CM

## 2019-08-17 NOTE — Therapy (Signed)
[facility] [address] [place], [place], [address] Phone: [phone]   Fax:  [phone]  Physical Therapy Treatment  Patient Details  Name: Michael Mcclure MRN: [id number] Date of Birth: [birth year] Referring Provider (PT): Mechele ClaudeWarren Stacks, MD   Encounter Date: 08/17/2019  PT End of Session - 08/17/19 0908    Visit Number  4    Number of Visits  16    Date for PT Re-Evaluation  10/06/19    Authorization Type  Medicare KX modifier after visit 15    PT Start Time  0904    PT Stop Time  0946    PT Time Calculation (min)  42 min    Activity Tolerance  Patient tolerated treatment well    Behavior During Therapy  [facility] for tasks assessed/performed       Past Medical History:  Diagnosis Date  . Actinic keratitis 2017  . Anxiety   . Arthritis   . BPH (benign prostatic hyperplasia)   . Cataract   . GERD (gastroesophageal reflux disease)   . Hemorrhoids   . HOH (hard of hearing)   . Hx of colonic polyps   . Hyperlipidemia   . Hypothyroidism   . Keratosis, seborrheic 2017  . Pelvic fracture (HCC)   . Thyroid disease     Past Surgical History:  Procedure Laterality Date  . ANKLE SURGERY    . BACK SURGERY    . CARPAL TUNNEL RELEASE Right    x2  . CATARACT EXTRACTION W/PHACO Right 05/18/2016   Procedure: CATARACT EXTRACTION PHACO AND INTRAOCULAR LENS PLACEMENT RIGHT EYE; CDE:  5.35;  Surgeon: Gemma PayorKerry Hunt, MD;  Location: [facility];  Service: Ophthalmology;  Laterality: Right;  . ROTATOR CUFF REPAIR    . SHOULDER SURGERY Right    x 2   . WRIST SURGERY Left    fracture    There were no vitals filed for this visit.  Subjective Assessment - 08/17/19 0911    Subjective  COVID19 screening performed prior to entering the building. Patient reports doing well and states no falls recently.    How long can you walk comfortably?  10 minutes    Patient Stated Goals  walk without hurting in my right side, stop losing my balance    Currently  in Pain?  No/denies         [facility] PT Assessment - 08/17/19 0001      Assessment   Medical Diagnosis  balance deficits, h/o falls    Referring Provider (PT)  Mechele ClaudeWarren Stacks, MD    Hand Dominance  Right    Prior Therapy  yes, several years ago      Precautions   Precautions  [place] MaFall                   [facility] Adult PT Treatment/Exercise - 08/17/19 0001      Exercises   Exercises  Knee/Hip      Knee/Hip Exercises: Aerobic   Nustep  L4 x 10 mins       Knee/Hip Exercises: Standing   Heel Raises  20 reps;Both    Hip Flexion  AROM;Stengthening;Both;20 reps;Knee bent    Hip Abduction  Stengthening;Both;2 sets;10 reps    Other Standing Knee Exercises  lateral stepping at counter top x3 minutes      Knee/Hip Exercises: Seated   Long Arc Quad  Strengthening;Both;2 sets;10 reps    Harley-DavidsonBall Squeeze  x2 minutes    Clamshell with Constellation BrandsheraBand  Red  x2 minutes   Marching  Strengthening;Both;20 reps    Marching Limitations  red theraband    Hamstring Curl  AROM;Strengthening;Both;15 reps    Sit to Sand  10 reps;without UE support          Balance Exercises - 08/17/19 0942      Balance Exercises: Standing   Standing Eyes Opened  Narrow base of support (BOS);2 reps;Time   1 minute each   Standing Eyes Closed  Wide (BOA);Solid surface;1 rep;Time   1 minute   Partial Tandem Stance  Eyes open;2 reps;30 secs;Intermittent upper extremity support             PT Long Term Goals - 08/10/19 1125      PT LONG TERM GOAL #1   Title  Pt will be independent in his HEP and progression.    Baseline  issued standing hip exercises today    Time  8    Period  Weeks    Status  New      PT LONG TERM GOAL #2   Title  Pt will improve his BERG balance score from 30/56 to >/= 40/56.    Baseline  30/56 on 08/08/2019    Time  8    Period  Weeks    Status  New      PT LONG TERM GOAL #3   Title  Pt will be able to perform 5 times sit to stand with no UE support in </= 15 seconds.     Baseline  18.67 seconds using UE support    Time  8    Period  Weeks    Status  New      PT LONG TERM GOAL #4   Title  Pt will be able to walk 20 minutes with pain in his low back of </=2/10.    Time  8    Period  Weeks    Status  New            Plan - 08/17/19 1030    Clinical Impression Statement  Patient responded well to TEs but did require intermittent verbal cuing for proper form. Patient was able to demonstrate good use of ankle strategies during WBOS EC balance. Patient required contact guard to attain partial tandem stance and SBA once balanced.    Personal Factors and Comorbidities  Age;Comorbidity 3+    Comorbidities  anxiety, HOH, hypothyroidism, pelvic fx, BPH, cataracts, R ankle surgery, carpel tunner, R rotator cuff surgery, L wrist fx, OA, lumbar surgery    Examination-Activity Limitations  Squat;Stairs;Stand;Lift    Examination-Participation Restrictions  Community Activity;Laundry;Yard Work    Stability/Clinical Decision Making  Stable/Uncomplicated    Clinical Decision Making  Moderate    Rehab Potential  Good    PT Frequency  2x / week    PT Duration  8 weeks    PT Treatment/Interventions  ADLs/Self Care Home Management;Cryotherapy;Electrical Stimulation;Moist Heat;Ultrasound;Gait training;Stair training;Functional mobility training;Therapeutic activities;Therapeutic exercise;Balance training;Neuromuscular re-education;Patient/family education;Manual techniques;Passive range of motion    PT Next Visit Plan  balance exercises, lumbar stretching, strengthening    PT Home Exercise Plan  heel raises, hip abd, marching all with UE support    Consulted and Agree with Plan of Care  Patient       Patient will benefit from skilled therapeutic intervention in order to improve the following deficits and impairments:  Abnormal gait, Pain, Decreased mobility, Decreased strength, Postural dysfunction, Decreased activity tolerance, Impaired flexibility, Decreased  balance  Visit Diagnosis: Difficulty  in walking, not elsewhere classified  Repeated falls  Muscle weakness (generalized)  Chronic right-sided low back pain with right-sided sciatica     Problem List Patient Active Problem List   Diagnosis Date Noted  . Impairment of balance 08/03/2019  . Osteoporosis 01/19/2017  . Osteoarthritis of hip 11/02/2016  . CKD (chronic kidney disease) stage 3, GFR 30-59 ml/min (HCC) 10/02/2016  . Venous insufficiency 05/11/2016  . Dizziness 02/07/2016  . Sensorineural hearing loss (SNHL) of both ears 10/10/2015  . Hypothyroidism 09/20/2014  . Neuropathy 02/14/2014  . Lumbar scoliosis 02/14/2014  . Anterolisthesis 02/14/2014  . GERD (gastroesophageal reflux disease) 03/19/2011  . Hyperlipidemia 03/19/2011  . Neurogenic bladder 03/19/2011  . BPH (benign prostatic hyperplasia) 03/19/2011  . Arthritis 12/06/2008  . Back pain 12/06/2008  . CARPAL TUNNEL SYNDROME, HX OF 12/06/2008    Gabriela Eves, PT, DPT 08/17/2019, 10:43 AM  [facility] [address] [place], [place], [address] Phone: [phone]   Fax:  [phone]  Name: Michael Mcclure MRN: [id number] Date of Birth: 25-Jul-1932

## 2019-08-22 ENCOUNTER — Ambulatory Visit: Payer: Medicare Other | Admitting: *Deleted

## 2019-08-22 ENCOUNTER — Other Ambulatory Visit: Payer: Self-pay

## 2019-08-22 DIAGNOSIS — M6281 Muscle weakness (generalized): Secondary | ICD-10-CM

## 2019-08-22 DIAGNOSIS — R296 Repeated falls: Secondary | ICD-10-CM

## 2019-08-22 DIAGNOSIS — R262 Difficulty in walking, not elsewhere classified: Secondary | ICD-10-CM

## 2019-08-22 NOTE — Therapy (Signed)
[facility] [address] [place], [place], [address] Phone: [phone]   Fax:  [phone]  Physical Therapy Treatment  Patient Details  Name: Michael Mcclure J Settles MRN: [id number] Date of Birth: 09-25-1932 Referring Provider (PT): Mechele ClaudeWarren Stacks, MD   Encounter Date: 08/22/2019  PT End of Session - 08/22/19 1029    Visit Number  5    Number of Visits  16    Date for PT Re-Evaluation  10/06/19    Authorization Type  Medicare KX modifier after visit 15    PT Start Time  1030    PT Stop Time  1119    PT Time Calculation (min)  49 min       Past Medical History:  Diagnosis Date  . Actinic keratitis 2017  . Anxiety   . Arthritis   . BPH (benign prostatic hyperplasia)   . Cataract   . GERD (gastroesophageal reflux disease)   . Hemorrhoids   . HOH (hard of hearing)   . Hx of colonic polyps   . Hyperlipidemia   . Hypothyroidism   . Keratosis, seborrheic 2017  . Pelvic fracture (HCC)   . Thyroid disease     Past Surgical History:  Procedure Laterality Date  . ANKLE SURGERY    . BACK SURGERY    . CARPAL TUNNEL RELEASE Right    x2  . CATARACT EXTRACTION W/PHACO Right 05/18/2016   Procedure: CATARACT EXTRACTION PHACO AND INTRAOCULAR LENS PLACEMENT RIGHT EYE; CDE:  5.35;  Surgeon: Gemma PayorKerry Hunt, MD;  Location: [facility];  Service: Ophthalmology;  Laterality: Right;  . ROTATOR CUFF REPAIR    . SHOULDER SURGERY Right    x 2   . WRIST SURGERY Left    fracture    There were no vitals filed for this visit.                    [facility] Treatment/Exercise - 08/22/19 0001      Exercises   Exercises  Knee/Hip      Knee/Hip Exercises: Aerobic   Nustep  L4 x 12 mins       Knee/Hip Exercises: Standing   Hip Flexion  AROM;Stengthening;Both;20 reps;Knee bent    Rocker Board  3 minutes;Limitations    Other Standing Knee Exercises  lateral stepping with SBA/CGA    Other Standing Knee Exercises  6in block toe taps for  balance CGA      Knee/Hip Exercises: Seated   Long Arc Quad  Strengthening;Both;10 reps;3 sets    Long Arc Quad Weight  3 lbs.    Ball Squeeze  x2 minutes    Clamshell with TheraBand  Red   x2 minutes   Marching  Strengthening;Both;20 reps    Hamstring Curl  AROM;Strengthening;Both;15 reps   red tband   Sit to Sand  10 reps;without UE support;2 sets          Balance Exercises - 08/22/19 1034      Balance Exercises: Standing   Standing Eyes Opened  Narrow base of support (BOS);2 reps;Time    Standing Eyes Closed  Wide (BOA);Solid surface;1 rep;Time    Partial Tandem Stance  Eyes open;2 reps;30 secs;Intermittent upper extremity support             PT Long Term Goals - 08/10/19 1125      PT LONG TERM GOAL #1   Title  Pt will be independent in his HEP and progression.    Baseline  issued standing  hip exercises today    Time  8    Period  Weeks    Status  New      PT LONG TERM GOAL #2   Title  Pt will improve his BERG balance score from 30/56 to >/= 40/56.    Baseline  30/56 on 08/08/2019    Time  8    Period  Weeks    Status  New      PT LONG TERM GOAL #3   Title  Pt will be able to perform 5 times sit to stand with no UE support in </= 15 seconds.    Baseline  18.67 seconds using UE support    Time  8    Period  Weeks    Status  New      PT LONG TERM GOAL #4   Title  Pt will be able to walk 20 minutes with pain in his low back of </=2/10.    Time  8    Period  Weeks    Status  New              Patient will benefit from skilled therapeutic intervention in order to improve the following deficits and impairments:     Visit Diagnosis: Difficulty in walking, not elsewhere classified  Repeated falls  Muscle weakness (generalized)     Problem List Patient Active Problem List   Diagnosis Date Noted  . Impairment of balance 08/03/2019  . Osteoporosis 01/19/2017  . Osteoarthritis of hip 11/02/2016  . CKD (chronic kidney disease) stage 3, GFR  30-59 ml/min (HCC) 10/02/2016  . Venous insufficiency 05/11/2016  . Dizziness 02/07/2016  . Sensorineural hearing loss (SNHL) of both ears 10/10/2015  . Hypothyroidism 09/20/2014  . Neuropathy 02/14/2014  . Lumbar scoliosis 02/14/2014  . Anterolisthesis 02/14/2014  . GERD (gastroesophageal reflux disease) 03/19/2011  . Hyperlipidemia 03/19/2011  . Neurogenic bladder 03/19/2011  . BPH (benign prostatic hyperplasia) 03/19/2011  . Arthritis 12/06/2008  . Back pain 12/06/2008  . CARPAL TUNNEL SYNDROME, HX OF 12/06/2008    RAMSEUR,CHRIS, PTA 08/22/2019, 11:35 AM  [facility] [place], [place], [address] Phone: [phone]   Fax:  [phone]  Name: SAHIR TOLSON MRN: [id number] Date of Birth: 08-06-1932

## 2019-08-24 ENCOUNTER — Other Ambulatory Visit: Payer: Self-pay

## 2019-08-24 ENCOUNTER — Other Ambulatory Visit: Payer: Self-pay | Admitting: Family Medicine

## 2019-08-24 ENCOUNTER — Ambulatory Visit: Payer: Medicare Other | Admitting: *Deleted

## 2019-08-24 DIAGNOSIS — R262 Difficulty in walking, not elsewhere classified: Secondary | ICD-10-CM | POA: Diagnosis not present

## 2019-08-24 DIAGNOSIS — R296 Repeated falls: Secondary | ICD-10-CM

## 2019-08-24 DIAGNOSIS — N400 Enlarged prostate without lower urinary tract symptoms: Secondary | ICD-10-CM

## 2019-08-24 DIAGNOSIS — M6281 Muscle weakness (generalized): Secondary | ICD-10-CM

## 2019-08-24 NOTE — Therapy (Signed)
[facility] [place], [place], [address] Phone: [phone]   Fax:  [phone]  Physical Therapy Treatment  Patient Details  Name: Michael Mcclure MRN: [id number] Date of Birth: September 13, 1932 Referring Provider (PT): Claretta Fraise, MD   Encounter Date: 08/24/2019  PT End of Session - 08/24/19 1109    Visit Number  6    Number of Visits  16    Date for PT Re-Evaluation  10/06/19    Authorization Type  Medicare KX modifier after visit 15    PT Start Time  1030    PT Stop Time  1117    PT Time Calculation (min)  47 min       Past Medical History:  Diagnosis Date  . Actinic keratitis 2017  . Anxiety   . Arthritis   . BPH (benign prostatic hyperplasia)   . Cataract   . GERD (gastroesophageal reflux disease)   . Hemorrhoids   . HOH (hard of hearing)   . Hx of colonic polyps   . Hyperlipidemia   . Hypothyroidism   . Keratosis, seborrheic 2017  . Pelvic fracture ([place])   . Thyroid disease     Past Surgical History:  Procedure Laterality Date  . ANKLE SURGERY    . BACK SURGERY    . CARPAL TUNNEL RELEASE Right    x2  . CATARACT EXTRACTION W/PHACO Right 05/18/2016   Procedure: CATARACT EXTRACTION PHACO AND INTRAOCULAR LENS PLACEMENT RIGHT EYE; CDE:  5.35;  Surgeon: Tonny Branch, MD;  Location: [facility];  Service: Ophthalmology;  Laterality: Right;  . ROTATOR CUFF REPAIR    . SHOULDER SURGERY Right    x 2   . WRIST SURGERY Left    fracture    There were no vitals filed for this visit.  Subjective Assessment - 08/24/19 1047    Subjective  COVID19 screening performed prior to entering the building. LBP is less    Patient Stated Goals  walk without hurting in my right side, stop losing my balance    Currently in Pain?  Yes    Pain Score  2     Pain Location  Back    Pain Orientation  Right    Pain Descriptors / Indicators  Aching    Pain Type  Chronic pain                       [facility] Adult PT  Treatment/Exercise - 08/24/19 0001      Exercises   Exercises  Knee/Hip      Knee/Hip Exercises: Aerobic   Nustep  L5 x 15 mins       Knee/Hip Exercises: Standing   Rocker Board  3 minutes;Limitations    Rocker Board Limitations  --   balance and calf stretching   Other Standing Knee Exercises  lateral stepping with SBA/CGA    Other Standing Knee Exercises  6in block toe taps for balance CGA      Knee/Hip Exercises: Seated   Long Arc Quad  Strengthening;Both;10 reps;3 sets    Long Arc Quad Weight  3 lbs.    Ball Squeeze  x2 minutes    Clamshell with TheraBand  Red   x2 minutes   Marching  Strengthening;Both;20 reps    Hamstring Curl  AROM;Strengthening;Both;15 reps   red tband   Sit to Sand  10 reps;without UE support;2 sets          Balance Exercises - 08/24/19 1051  Balance Exercises: Standing   Standing Eyes Opened  Narrow base of support (BOS);2 reps;Time   UE diagonal reaches   Standing Eyes Closed  Wide (BOA);Solid surface;1 rep;Time   1 minute   Partial Tandem Stance  Eyes open;30 secs;Intermittent upper extremity support;4 reps   more stable with LT foot forward            PT Long Term Goals - 08/24/19 1113      PT LONG TERM GOAL #1   Title  Pt will be independent in his HEP and progression.    Baseline  issued standing hip exercises today    Time  8    Period  Weeks    Status  Achieved      PT LONG TERM GOAL #2   Title  Pt will improve his BERG balance score from 30/56 to >/= 40/56.    Baseline  30/56 on 08/08/2019    Period  Weeks    Status  On-going      PT LONG TERM GOAL #3   Title  Pt will be able to perform 5 times sit to stand with no UE support in </= 15 seconds.    Baseline  18.67 seconds using UE support     Met 08-24-19 14 secs    Time  8    Period  Weeks    Status  Achieved      PT LONG TERM GOAL #4   Title  Pt will be able to walk 20 minutes with pain in his low back of </=2/10.    Time  8    Period  Weeks    Status   On-going            Plan - 08/24/19 1129    Clinical Impression Statement  Pt arrived today doing fairly well with decreased LBP and feels he is getting around better at home. He was able to perform all strengthening  exs and balance act.'s. He was able to meet LTG for sit to stand today. Others ongoing.    PT Next Visit Plan  balance exercises, lumbar stretching, strengthening    PT Home Exercise Plan  heel raises, hip abd, marching all with UE support    Consulted and Agree with Plan of Care  Patient       Patient will benefit from skilled therapeutic intervention in order to improve the following deficits and impairments:     Visit Diagnosis: Difficulty in walking, not elsewhere classified  Repeated falls  Muscle weakness (generalized)     Problem List Patient Active Problem List   Diagnosis Date Noted  . Impairment of balance 08/03/2019  . Osteoporosis 01/19/2017  . Osteoarthritis of hip 11/02/2016  . CKD (chronic kidney disease) stage 3, GFR 30-59 ml/min (HCC) 10/02/2016  . Venous insufficiency 05/11/2016  . Dizziness 02/07/2016  . Sensorineural hearing loss (SNHL) of both ears 10/10/2015  . Hypothyroidism 09/20/2014  . Neuropathy 02/14/2014  . Lumbar scoliosis 02/14/2014  . Anterolisthesis 02/14/2014  . GERD (gastroesophageal reflux disease) 03/19/2011  . Hyperlipidemia 03/19/2011  . Neurogenic bladder 03/19/2011  . BPH (benign prostatic hyperplasia) 03/19/2011  . Arthritis 12/06/2008  . Back pain 12/06/2008  . CARPAL TUNNEL SYNDROME, HX OF 12/06/2008    Indie Nickerson,CHRIS, PTA 08/24/2019, 12:07 PM  [facility] [place], [place], [address] Phone: [phone]   Fax:  [phone]  Name: Michael Mcclure MRN: [id number] Date of Birth: [birth year]

## 2019-08-25 ENCOUNTER — Other Ambulatory Visit: Payer: Self-pay | Admitting: Family Medicine

## 2019-08-25 DIAGNOSIS — K219 Gastro-esophageal reflux disease without esophagitis: Secondary | ICD-10-CM

## 2019-08-29 ENCOUNTER — Ambulatory Visit: Payer: Medicare Other | Admitting: *Deleted

## 2019-08-29 ENCOUNTER — Other Ambulatory Visit: Payer: Self-pay

## 2019-08-29 DIAGNOSIS — M6281 Muscle weakness (generalized): Secondary | ICD-10-CM

## 2019-08-29 DIAGNOSIS — R296 Repeated falls: Secondary | ICD-10-CM

## 2019-08-29 DIAGNOSIS — R262 Difficulty in walking, not elsewhere classified: Secondary | ICD-10-CM

## 2019-08-29 DIAGNOSIS — G8929 Other chronic pain: Secondary | ICD-10-CM

## 2019-08-29 NOTE — Therapy (Signed)
[facility] [place], [place], [address] Phone: [phone]   Fax:  [phone]  Physical Therapy Treatment  Patient Details  Name: Michael Mcclure MRN: [id number] Date of Birth: 03/12/1932 Referring Provider (PT): Claretta Fraise, MD   Encounter Date: 08/29/2019  PT End of Session - 08/29/19 1050    Visit Number  7    Number of Visits  16    Date for PT Re-Evaluation  10/06/19    Authorization Type  Medicare KX modifier after visit 15    PT Start Time  1030    PT Stop Time  1119    PT Time Calculation (min)  49 min       Past Medical History:  Diagnosis Date  . Actinic keratitis 2017  . Anxiety   . Arthritis   . BPH (benign prostatic hyperplasia)   . Cataract   . GERD (gastroesophageal reflux disease)   . Hemorrhoids   . HOH (hard of hearing)   . Hx of colonic polyps   . Hyperlipidemia   . Hypothyroidism   . Keratosis, seborrheic 2017  . Pelvic fracture ([place])   . Thyroid disease     Past Surgical History:  Procedure Laterality Date  . ANKLE SURGERY    . BACK SURGERY    . CARPAL TUNNEL RELEASE Right    x2  . CATARACT EXTRACTION W/PHACO Right 05/18/2016   Procedure: CATARACT EXTRACTION PHACO AND INTRAOCULAR LENS PLACEMENT RIGHT EYE; CDE:  5.35;  Surgeon: Tonny Branch, MD;  Location: [facility];  Service: Ophthalmology;  Laterality: Right;  . ROTATOR CUFF REPAIR    . SHOULDER SURGERY Right    x 2   . WRIST SURGERY Left    fracture    There were no vitals filed for this visit.  Subjective Assessment - 08/29/19 1105    Subjective  COVID19 screening performed prior to entering the building.  Minimal LBP today. Practice at home    Patient Stated Goals  walk without hurting in my right side, stop losing my balance    Currently in Pain?  Yes    Pain Score  2     Pain Location  Back    Pain Orientation  Right    Pain Descriptors / Indicators  Aching    Pain Type  Chronic pain                        [facility] Adult PT Treatment/Exercise - 08/29/19 0001      Exercises   Exercises  Knee/Hip      Knee/Hip Exercises: Aerobic   Nustep  L5 x 15 mins       Knee/Hip Exercises: Standing   Rocker Board  3 minutes;Limitations    Rocker Board Limitations  --   balance and calf stretching   Other Standing Knee Exercises  lateral stepping with SBA/CGA    Other Standing Knee Exercises  6in block toe taps for balance CGA      Knee/Hip Exercises: Seated   Long Arc Quad  Strengthening;Both;10 reps;3 sets    Long Arc Quad Weight  4 lbs.    Ball Squeeze  x2 minutes    Clamshell with TheraBand  Red   x2 minutes   Marching  Strengthening;Both;20 reps    Hamstring Curl  AROM;Strengthening;Both;15 reps   red tband   Sit to [employer]  10 reps;without UE support;2 sets          Balance  Exercises - 08/29/19 1058      Balance Exercises: Standing   Standing Eyes Opened  Narrow base of support (BOS);2 reps;Time    Standing Eyes Closed  Wide (BOA);Solid surface;1 rep;Time    Partial Tandem Stance  Eyes open;30 secs;Intermittent upper extremity support;4 reps   able to hold  for 30 secs today with Lt foot forward, 15 sec            PT Long Term Goals - 08/24/19 1113      PT LONG TERM GOAL #1   Title  Pt will be independent in his HEP and progression.    Baseline  issued standing hip exercises today    Time  8    Period  Weeks    Status  Achieved      PT LONG TERM GOAL #2   Title  Pt will improve his BERG balance score from 30/56 to >/= 40/56.    Baseline  30/56 on 08/08/2019    Period  Weeks    Status  On-going      PT LONG TERM GOAL #3   Title  Pt will be able to perform 5 times sit to stand with no UE support in </= 15 seconds.    Baseline  18.67 seconds using UE support     Met 08-24-19 14 secs    Time  8    Period  Weeks    Status  Achieved      PT LONG TERM GOAL #4   Title  Pt will be able to walk 20 minutes with pain in his low back of  </=2/10.    Time  8    Period  Weeks    Status  On-going              Patient will benefit from skilled therapeutic intervention in order to improve the following deficits and impairments:     Visit Diagnosis: Difficulty in walking, not elsewhere classified  Repeated falls  Muscle weakness (generalized)  Chronic right-sided low back pain with right-sided sciatica     Problem List Patient Active Problem List   Diagnosis Date Noted  . Impairment of balance 08/03/2019  . Osteoporosis 01/19/2017  . Osteoarthritis of hip 11/02/2016  . CKD (chronic kidney disease) stage 3, GFR 30-59 ml/min (HCC) 10/02/2016  . Venous insufficiency 05/11/2016  . Dizziness 02/07/2016  . Sensorineural hearing loss (SNHL) of both ears 10/10/2015  . Hypothyroidism 09/20/2014  . Neuropathy 02/14/2014  . Lumbar scoliosis 02/14/2014  . Anterolisthesis 02/14/2014  . GERD (gastroesophageal reflux disease) 03/19/2011  . Hyperlipidemia 03/19/2011  . Neurogenic bladder 03/19/2011  . BPH (benign prostatic hyperplasia) 03/19/2011  . Arthritis 12/06/2008  . Back pain 12/06/2008  . CARPAL TUNNEL SYNDROME, HX OF 12/06/2008    Winona Sison,CHRIS, PTA 08/29/2019, 11:21 AM  [facility] [place], [place], [address] Phone: [phone]   Fax:  [phone]  Name: Michael Mcclure MRN: [id number] Date of Birth: 04/30/1932

## 2019-08-31 ENCOUNTER — Encounter: Payer: Self-pay | Admitting: *Deleted

## 2019-08-31 ENCOUNTER — Other Ambulatory Visit: Payer: Self-pay

## 2019-08-31 ENCOUNTER — Ambulatory Visit: Payer: Medicare Other | Admitting: *Deleted

## 2019-08-31 DIAGNOSIS — R262 Difficulty in walking, not elsewhere classified: Secondary | ICD-10-CM | POA: Diagnosis not present

## 2019-08-31 DIAGNOSIS — M6281 Muscle weakness (generalized): Secondary | ICD-10-CM

## 2019-08-31 DIAGNOSIS — R296 Repeated falls: Secondary | ICD-10-CM

## 2019-08-31 DIAGNOSIS — G8929 Other chronic pain: Secondary | ICD-10-CM

## 2019-08-31 DIAGNOSIS — M5441 Lumbago with sciatica, right side: Secondary | ICD-10-CM

## 2019-08-31 NOTE — Therapy (Signed)
[facility] [place], [place], [address] Phone: [phone]   Fax:  [phone]  Physical Therapy Treatment  Patient Details  Name: Michael Mcclure MRN: [id number] Date of Birth: [birth year] Referring Provider (PT): Claretta Fraise, MD   Encounter Date: 08/31/2019  PT End of Session - 08/31/19 1051    Visit Number  8    Number of Visits  16    Date for PT Re-Evaluation  10/06/19    Authorization Type  Medicare KX modifier after visit 15    PT Start Time  1030    PT Stop Time  1120    PT Time Calculation (min)  50 min    Equipment Utilized During Treatment  Gait belt    Activity Tolerance  Patient tolerated treatment well    Behavior During Therapy  [facility] for tasks assessed/performed       Past Medical History:  Diagnosis Date  . Actinic keratitis 2017  . Anxiety   . Arthritis   . BPH (benign prostatic hyperplasia)   . Cataract   . GERD (gastroesophageal reflux disease)   . Hemorrhoids   . HOH (hard of hearing)   . Hx of colonic polyps   . Hyperlipidemia   . Hypothyroidism   . Keratosis, seborrheic 2017  . Pelvic fracture ([place])   . Thyroid disease     Past Surgical History:  Procedure Laterality Date  . ANKLE SURGERY    . BACK SURGERY    . CARPAL TUNNEL RELEASE Right    x2  . CATARACT EXTRACTION W/PHACO Right 05/18/2016   Procedure: CATARACT EXTRACTION PHACO AND INTRAOCULAR LENS PLACEMENT RIGHT EYE; CDE:  5.35;  Surgeon: Tonny Branch, MD;  Location: [facility];  Service: Ophthalmology;  Laterality: Right;  . ROTATOR CUFF REPAIR    . SHOULDER SURGERY Right    x 2   . WRIST SURGERY Left    fracture    There were no vitals filed for this visit.  Subjective Assessment - 08/31/19 1125    Subjective  COVID19 screening performed prior to entering the building.  Minimal LBP today. Practice at home each day. Able to walk longer now 20-30 mins, but back pain is 4-5/10    How long can you walk comfortably?  10 minutes     Patient Stated Goals  walk without hurting in my right side, stop losing my balance    Currently in Pain?  Yes    Pain Score  2     Pain Location  Back    Pain Orientation  Right    Pain Descriptors / Indicators  Aching;Sore                       [facility] Adult PT Treatment/Exercise - 08/31/19 0001      Exercises   Exercises  Knee/Hip      Knee/Hip Exercises: Aerobic   Nustep  L5 x 15 mins seat 12      Knee/Hip Exercises: Standing   Rocker Board  3 minutes;Limitations    Rocker Board Limitations  --   balance and calf stretching   Other Standing Knee Exercises  lateral stepping with SBA/CGA    Other Standing Knee Exercises  6in block toe taps for balance CGA      Knee/Hip Exercises: Seated   Long Arc Quad  Strengthening;Both;10 reps;3 sets    Long Arc Quad Weight  4 lbs.    Ball Squeeze  x2 minutes  Clamshell with TheraBand  Green   3x10   Marching  --    Hamstring Curl  AROM;Strengthening;Both;15 reps   green tband   Sit to Sand  10 reps;without UE support;2 sets          Balance Exercises - 08/31/19 1040      Balance Exercises: Standing   Standing Eyes Opened  Narrow base of support (BOS);2 reps;Time    Standing Eyes Closed  Wide (BOA);Solid surface;1 rep;Time    Partial Tandem Stance  Eyes open;30 secs;Intermittent upper extremity support;4 reps   able to hold  for 30 secs today with Lt foot forward, 15 sec            PT Long Term Goals - 08/24/19 1113      PT LONG TERM GOAL #1   Title  Pt will be independent in his HEP and progression.    Baseline  issued standing hip exercises today    Time  8    Period  Weeks    Status  Achieved      PT LONG TERM GOAL #2   Title  Pt will improve his BERG balance score from 30/56 to >/= 40/56.    Baseline  30/56 on 08/08/2019    Period  Weeks    Status  On-going      PT LONG TERM GOAL #3   Title  Pt will be able to perform 5 times sit to stand with no UE support in </= 15 seconds.     Baseline  18.67 seconds using UE support     Met 08-24-19 14 secs    Time  8    Period  Weeks    Status  Achieved      PT LONG TERM GOAL #4   Title  Pt will be able to walk 20 minutes with pain in his low back of </=2/10.    Time  8    Period  Weeks    Status  On-going            Plan - 08/31/19 1052    Clinical Impression Statement  Pt arrived today doing fairly well and reports he is able to walk longer now 20-30 mins, but LB/hip still hurts 4-5/10. He was able to complete all therex and balance act.'s. today and continues to progress.    Comorbidities  anxiety, HOH, hypothyroidism, pelvic fx, BPH, cataracts, R ankle surgery, carpel tunner, R rotator cuff surgery, L wrist fx, OA, lumbar surgery    Examination-Participation Restrictions  Community Activity;Laundry;Yard Work    Stability/Clinical Decision Making  Stable/Uncomplicated    PT Frequency  2x / week    PT Treatment/Interventions  ADLs/Self Care Home Management;Cryotherapy;Electrical Stimulation;Moist Heat;Ultrasound;Gait training;Stair training;Functional mobility training;Therapeutic activities;Therapeutic exercise;Balance training;Neuromuscular re-education;Patient/family education;Manual techniques;Passive range of motion    PT Next Visit Plan  balance exercises, lumbar stretching, strengthening    PT Home Exercise Plan  heel raises, hip abd, marching all with UE support       Patient will benefit from skilled therapeutic intervention in order to improve the following deficits and impairments:  Abnormal gait, Pain, Decreased mobility, Decreased strength, Postural dysfunction, Decreased activity tolerance, Impaired flexibility, Decreased balance  Visit Diagnosis: Difficulty in walking, not elsewhere classified  Repeated falls  Muscle weakness (generalized)  Chronic right-sided low back pain with right-sided sciatica     Problem List Patient Active Problem List   Diagnosis Date Noted  . Impairment of balance  08/03/2019  . Osteoporosis  01/19/2017  . Osteoarthritis of hip 11/02/2016  . CKD (chronic kidney disease) stage 3, GFR 30-59 ml/min (HCC) 10/02/2016  . Venous insufficiency 05/11/2016  . Dizziness 02/07/2016  . Sensorineural hearing loss (SNHL) of both ears 10/10/2015  . Hypothyroidism 09/20/2014  . Neuropathy 02/14/2014  . Lumbar scoliosis 02/14/2014  . Anterolisthesis 02/14/2014  . GERD (gastroesophageal reflux disease) 03/19/2011  . Hyperlipidemia 03/19/2011  . Neurogenic bladder 03/19/2011  . BPH (benign prostatic hyperplasia) 03/19/2011  . Arthritis 12/06/2008  . Back pain 12/06/2008  . CARPAL TUNNEL SYNDROME, HX OF 12/06/2008    RAMSEUR,CHRIS, PTA 08/31/2019, 11:50 AM  [facility] [place], [place], [address] Phone: [phone]   Fax:  [phone]  Name: Michael Mcclure MRN: [id number] Date of Birth: 06-19-1932

## 2019-09-04 ENCOUNTER — Other Ambulatory Visit: Payer: Self-pay | Admitting: Family Medicine

## 2019-09-04 DIAGNOSIS — E785 Hyperlipidemia, unspecified: Secondary | ICD-10-CM

## 2019-09-04 DIAGNOSIS — F419 Anxiety disorder, unspecified: Secondary | ICD-10-CM

## 2019-09-05 ENCOUNTER — Ambulatory Visit: Payer: Medicare Other | Admitting: *Deleted

## 2019-09-05 ENCOUNTER — Other Ambulatory Visit: Payer: Self-pay

## 2019-09-05 DIAGNOSIS — R262 Difficulty in walking, not elsewhere classified: Secondary | ICD-10-CM

## 2019-09-05 DIAGNOSIS — G8929 Other chronic pain: Secondary | ICD-10-CM

## 2019-09-05 DIAGNOSIS — R296 Repeated falls: Secondary | ICD-10-CM

## 2019-09-05 DIAGNOSIS — M6281 Muscle weakness (generalized): Secondary | ICD-10-CM

## 2019-09-05 NOTE — Therapy (Signed)
[facility] [place], [place], [address] Phone: [phone]   Fax:  [phone]  Physical Therapy Treatment  Patient Details  Name: Michael Mcclure MRN: [id number] Date of Birth: 13-May-1932 Referring Provider (PT): Claretta Fraise, MD   Encounter Date: 09/05/2019  PT End of Session - 09/05/19 1106    Visit Number  9    Number of Visits  16    Date for PT Re-Evaluation  10/06/19    Authorization Type  Medicare KX modifier after visit 15    PT Start Time  0945    PT Stop Time  1034    PT Time Calculation (min)  49 min       Past Medical History:  Diagnosis Date  . Actinic keratitis 2017  . Anxiety   . Arthritis   . BPH (benign prostatic hyperplasia)   . Cataract   . GERD (gastroesophageal reflux disease)   . Hemorrhoids   . HOH (hard of hearing)   . Hx of colonic polyps   . Hyperlipidemia   . Hypothyroidism   . Keratosis, seborrheic 2017  . Pelvic fracture ([place])   . Thyroid disease     Past Surgical History:  Procedure Laterality Date  . ANKLE SURGERY    . BACK SURGERY    . CARPAL TUNNEL RELEASE Right    x2  . CATARACT EXTRACTION W/PHACO Right 05/18/2016   Procedure: CATARACT EXTRACTION PHACO AND INTRAOCULAR LENS PLACEMENT RIGHT EYE; CDE:  5.35;  Surgeon: Tonny Branch, MD;  Location: [facility];  Service: Ophthalmology;  Laterality: Right;  . ROTATOR CUFF REPAIR    . SHOULDER SURGERY Right    x 2   . WRIST SURGERY Left    fracture    There were no vitals filed for this visit.  Subjective Assessment - 09/05/19 1001    Subjective  COVID19 screening performed prior to entering the building.  Minimal LBP today.    Patient Stated Goals  walk without hurting in my right side, stop losing my balance    Currently in Pain?  Yes    Pain Score  2     Pain Location  Back    Pain Orientation  Right    Pain Descriptors / Indicators  Sore    Pain Type  Chronic pain         [facility] PT Assessment - 09/05/19 0001       Berg Balance Test   Sit to Stand  Able to stand  independently using hands    Standing Unsupported  Able to stand safely 2 minutes    Sitting with Back Unsupported but Feet Supported on Floor or Stool  Able to sit safely and securely 2 minutes    Stand to Sit  Sits safely with minimal use of hands    Transfers  Able to transfer safely, definite need of hands    Standing Unsupported with Eyes Closed  Able to stand 10 seconds safely    Standing Unsupported with Feet Together  Able to place feet together independently but unable to hold for 30 seconds    From Standing, Reach Forward with Outstretched Arm  Can reach forward >12 cm safely (5")    From Standing Position, Pick up Object from Floor  Able to pick up shoe, needs supervision    From Standing Position, Turn to Look Behind Over each Shoulder  Turn sideways only but maintains balance    Turn 360 Degrees  Able to turn  360 degrees safely but slowly    Standing Unsupported, Alternately Place Feet on Step/Stool  Able to stand independently and complete 8 steps >20 seconds    Standing Unsupported, One Foot in Front  Able to take small step independently and hold 30 seconds    Standing on One Leg  Tries to lift leg/unable to hold 3 seconds but remains standing independently    Total Score  40                   [facility] Adult PT Treatment/Exercise - 09/05/19 0001      Exercises   Exercises  Knee/Hip      Knee/Hip Exercises: Aerobic   Nustep  L5 x 15 mins seat 12      Knee/Hip Exercises: Seated   Long Arc Quad  Strengthening;Both;10 reps;3 sets    Long Arc Quad Weight  4 lbs.    Ball Squeeze  x2 minutes    Clamshell with TheraBand  Green   3x10   Hamstring Curl  AROM;Strengthening;Both;20 reps    Sit to Sand  10 reps;without UE support;1 set                  PT Long Term Goals - 08/24/19 1113      PT LONG TERM GOAL #1   Title  Pt will be independent in his HEP and progression.    Baseline  issued standing  hip exercises today    Time  8    Period  Weeks    Status  Achieved      PT LONG TERM GOAL #2   Title  Pt will improve his BERG balance score from 30/56 to >/= 40/56.    Baseline  30/56 on 08/08/2019    Period  Weeks    Status  On-going      PT LONG TERM GOAL #3   Title  Pt will be able to perform 5 times sit to stand with no UE support in </= 15 seconds.    Baseline  18.67 seconds using UE support     Met 08-24-19 14 secs    Time  8    Period  Weeks    Status  Achieved      PT LONG TERM GOAL #4   Title  Pt will be able to walk 20 minutes with pain in his low back of </=2/10.    Time  8    Period  Weeks    Status  On-going            Plan - 09/05/19 1109    Clinical Impression Statement  Pt arrived today doing fairly well and reports minimal LBP today. Berg test was performed today with improved score to 40 from 30 on IEV. Pt was also able to complete LE strengthening today without complaints.    Personal Factors and Comorbidities  Age;Comorbidity 3+    Comorbidities  anxiety, HOH, hypothyroidism, pelvic fx, BPH, cataracts, R ankle surgery, carpel tunner, R rotator cuff surgery, L wrist fx, OA, lumbar surgery    Examination-Participation Restrictions  Community Activity;Laundry;Yard Work    Stability/Clinical Decision Making  Stable/Uncomplicated    Rehab Potential  Good    PT Frequency  2x / week    PT Duration  8 weeks    PT Treatment/Interventions  ADLs/Self Care Home Management;Cryotherapy;Electrical Stimulation;Moist Heat;Ultrasound;Gait training;Stair training;Functional mobility training;Therapeutic activities;Therapeutic exercise;Balance training;Neuromuscular re-education;Patient/family education;Manual techniques;Passive range of motion    PT Next Visit Plan    balance exercises, lumbar stretching, strengthening    PT Home Exercise Plan  heel raises, hip abd, marching all with UE support    Consulted and Agree with Plan of Care  Patient       Patient will benefit  from skilled therapeutic intervention in order to improve the following deficits and impairments:  Abnormal gait, Pain, Decreased mobility, Decreased strength, Postural dysfunction, Decreased activity tolerance, Impaired flexibility, Decreased balance  Visit Diagnosis: Difficulty in walking, not elsewhere classified  Repeated falls  Muscle weakness (generalized)  Chronic right-sided low back pain with right-sided sciatica     Problem List Patient Active Problem List   Diagnosis Date Noted  . Impairment of balance 08/03/2019  . Osteoporosis 01/19/2017  . Osteoarthritis of hip 11/02/2016  . CKD (chronic kidney disease) stage 3, GFR 30-59 ml/min (HCC) 10/02/2016  . Venous insufficiency 05/11/2016  . Dizziness 02/07/2016  . Sensorineural hearing loss (SNHL) of both ears 10/10/2015  . Hypothyroidism 09/20/2014  . Neuropathy 02/14/2014  . Lumbar scoliosis 02/14/2014  . Anterolisthesis 02/14/2014  . GERD (gastroesophageal reflux disease) 03/19/2011  . Hyperlipidemia 03/19/2011  . Neurogenic bladder 03/19/2011  . BPH (benign prostatic hyperplasia) 03/19/2011  . Arthritis 12/06/2008  . Back pain 12/06/2008  . CARPAL TUNNEL SYNDROME, HX OF 12/06/2008    RAMSEUR,CHRIS, PTA 09/05/2019, 11:26 AM  [facility] [address] [place], [place], [address] Phone: [phone]   Fax:  [phone]  Name: Kamen J Albano MRN: [id number] Date of Birth: 11/01/1932   

## 2019-09-07 ENCOUNTER — Other Ambulatory Visit: Payer: Self-pay

## 2019-09-07 ENCOUNTER — Encounter: Payer: Self-pay | Admitting: Physical Therapy

## 2019-09-07 ENCOUNTER — Ambulatory Visit: Payer: Medicare Other | Attending: Family Medicine | Admitting: Physical Therapy

## 2019-09-07 DIAGNOSIS — R296 Repeated falls: Secondary | ICD-10-CM

## 2019-09-07 DIAGNOSIS — G8929 Other chronic pain: Secondary | ICD-10-CM | POA: Diagnosis present

## 2019-09-07 DIAGNOSIS — M6281 Muscle weakness (generalized): Secondary | ICD-10-CM

## 2019-09-07 DIAGNOSIS — R262 Difficulty in walking, not elsewhere classified: Secondary | ICD-10-CM | POA: Diagnosis present

## 2019-09-07 DIAGNOSIS — M5441 Lumbago with sciatica, right side: Secondary | ICD-10-CM | POA: Insufficient documentation

## 2019-09-07 NOTE — Therapy (Signed)
[facility] [place], [place], [address] Phone: [phone]   Fax:  [phone]  Physical Therapy Treatment Progress Note Reporting Period 08/08/2019 to 09/07/2019  See note below for Objective Data and Assessment of Progress/Goals. Patient is progressing towards goals. Gabriela Eves, PT, DPT      Patient Details  Name: Michael Mcclure MRN: [id number] Date of Birth: 11/16/1932 Referring Provider (PT): Claretta Fraise, MD   Encounter Date: 09/07/2019  PT End of Session - 09/07/19 1104    Visit Number  10    Number of Visits  16    Date for PT Re-Evaluation  10/06/19    Authorization Type  Medicare KX modifier after visit 15    PT Start Time  1030    PT Stop Time  1111    PT Time Calculation (min)  41 min       Past Medical History:  Diagnosis Date  . Actinic keratitis 2017  . Anxiety   . Arthritis   . BPH (benign prostatic hyperplasia)   . Cataract   . GERD (gastroesophageal reflux disease)   . Hemorrhoids   . HOH (hard of hearing)   . Hx of colonic polyps   . Hyperlipidemia   . Hypothyroidism   . Keratosis, seborrheic 2017  . Pelvic fracture ([place])   . Thyroid disease     Past Surgical History:  Procedure Laterality Date  . ANKLE SURGERY    . BACK SURGERY    . CARPAL TUNNEL RELEASE Right    x2  . CATARACT EXTRACTION W/PHACO Right 05/18/2016   Procedure: CATARACT EXTRACTION PHACO AND INTRAOCULAR LENS PLACEMENT RIGHT EYE; CDE:  5.35;  Surgeon: Tonny Branch, MD;  Location: [facility];  Service: Ophthalmology;  Laterality: Right;  . ROTATOR CUFF REPAIR    . SHOULDER SURGERY Right    x 2   . WRIST SURGERY Left    fracture    There were no vitals filed for this visit.  Subjective Assessment - 09/07/19 1032    Subjective  COVID19 screening performed prior to entering the building.  Patient reported "some" pain in back    How long can you walk comfortably?  10 minutes    Patient Stated Goals  walk without  hurting in my right side, stop losing my balance    Currently in Pain?  Yes    Pain Score  2     Pain Location  Back    Pain Orientation  Right;Lower    Pain Descriptors / Indicators  Sore    Pain Type  Chronic pain    Pain Onset  More than a month ago    Pain Frequency  Intermittent    Aggravating Factors   prolong standing, walking, or activity    Pain Relieving Factors  at rest         [facility] PT Assessment - 09/07/19 0001      Berg Balance Test   Sit to Stand  Able to stand  independently using hands    Standing Unsupported  Able to stand safely 2 minutes    Sitting with Back Unsupported but Feet Supported on Floor or Stool  Able to sit safely and securely 2 minutes    Stand to Sit  Sits safely with minimal use of hands    Transfers  Able to transfer safely, minor use of hands    Standing Unsupported with Eyes Closed  Able to stand 10 seconds safely    Standing  Unsupported with Feet Together  Able to place feet together independently but unable to hold for 30 seconds    From Standing, Reach Forward with Outstretched Arm  Can reach forward >12 cm safely (5")    From Standing Position, Pick up Object from [place] to pick up shoe, needs supervision    From Standing Position, Turn to Look Behind Over each Shoulder  Turn sideways only but maintains balance    Turn 360 Degrees  Able to turn 360 degrees safely but slowly    Standing Unsupported, Alternately Place Feet on Step/Stool  Able to stand independently and complete 8 steps >20 seconds    Standing Unsupported, One Foot in Front  Able to take small step independently and hold 30 seconds    Standing on One Leg  Tries to lift leg/unable to hold 3 seconds but remains standing independently    Total Score  41                   [facility] Adult PT Treatment/Exercise - 09/07/19 0001      Knee/Hip Exercises: Aerobic   Nustep  L5 x 15 mins seat 12      Knee/Hip Exercises: Seated   Long Arc Quad  Strengthening;Both;10  reps;3 sets    [employer] Weight  4 lbs.    Ball Squeeze  x2 minutes    Clamshell with TheraBand  Green   x30   Hamstring Curl  Strengthening;Both;20 reps    Hamstring Limitations  green t-band          Balance Exercises - 09/07/19 1034      Balance Exercises: Standing   Standing Eyes Opened  Narrow base of support (BOS);Foam/compliant surface   x34mn   Standing Eyes Closed  Wide (BOA);2 reps;10 secs    Rockerboard  Anterior/posterior   x370m   Sidestepping  4 reps    Marching Limitations  2x10    Heel Raises Limitations  2x10    Sit to Stand Time  2x10             PT Long Term Goals - 09/07/19 1045      PT LONG TERM GOAL #1   Title  Pt will be independent in his HEP and progression.    Baseline  issued standing hip exercises today    Time  8    Period  Weeks    Status  Achieved      PT LONG TERM GOAL #2   Title  Pt will improve his BERG balance score from 30/56 to >/= 40/56.    Baseline  14/56 09/07/19    Time  8    Period  Weeks    Status  Achieved      PT LONG TERM GOAL #3   Title  Pt will be able to perform 5 times sit to stand with no UE support in </= 15 seconds.    Baseline  18.67 seconds using UE support     Met 08-24-19 14 secs    Time  8    Period  Weeks    Status  Achieved      PT LONG TERM GOAL #4   Title  Pt will be able to walk 20 minutes with pain in his low back of </=2/10.    Time  8    Period  Weeks    Status  On-going   up to 8/10 09/07/19  Plan - 09/07/19 1101    Clinical Impression Statement  Patient tolerated treatment well today with impoved balance and endurance with exercises. Patient improved BERG score to 41/56 today and met goal #2 with others progressing. Patient able to perform balance activities with minimal LOB and able to recover with min assist. Patient has ongoing pain in back with any prolong activity up to 8/10 per reported, and pain 2/10 on most days with minimal activity.    Personal Factors and  Comorbidities  Age;Comorbidity 3+    Comorbidities  anxiety, HOH, hypothyroidism, pelvic fx, BPH, cataracts, R ankle surgery, carpel tunner, R rotator cuff surgery, L wrist fx, OA, lumbar surgery    Examination-Activity Limitations  Squat;Stairs;Stand;Lift    Examination-Participation Restrictions  Community Activity;Laundry;Yard Work    Stability/Clinical Decision Making  Stable/Uncomplicated    Rehab Potential  Good    PT Frequency  2x / week    PT Duration  8 weeks    PT Treatment/Interventions  ADLs/Self Care Home Management;Cryotherapy;Electrical Stimulation;Moist Heat;Ultrasound;Gait training;Stair training;Functional mobility training;Therapeutic activities;Therapeutic exercise;Balance training;Neuromuscular re-education;Patient/family education;Manual techniques;Passive range of motion    PT Next Visit Plan  cont with POC for lumbar strengthening and stretching per tolerance/balance    Consulted and Agree with Plan of Care  Patient       Patient will benefit from skilled therapeutic intervention in order to improve the following deficits and impairments:  Abnormal gait, Pain, Decreased mobility, Decreased strength, Postural dysfunction, Decreased activity tolerance, Impaired flexibility, Decreased balance  Visit Diagnosis: Repeated falls  Difficulty in walking, not elsewhere classified  Muscle weakness (generalized)  Chronic right-sided low back pain with right-sided sciatica     Problem List Patient Active Problem List   Diagnosis Date Noted  . Impairment of balance 08/03/2019  . Osteoporosis 01/19/2017  . Osteoarthritis of hip 11/02/2016  . CKD (chronic kidney disease) stage 3, GFR 30-59 ml/min 10/02/2016  . Venous insufficiency 05/11/2016  . Dizziness 02/07/2016  . Sensorineural hearing loss (SNHL) of both ears 10/10/2015  . Hypothyroidism 09/20/2014  . Neuropathy 02/14/2014  . Lumbar scoliosis 02/14/2014  . Anterolisthesis 02/14/2014  . GERD (gastroesophageal  reflux disease) 03/19/2011  . Hyperlipidemia 03/19/2011  . Neurogenic bladder 03/19/2011  . BPH (benign prostatic hyperplasia) 03/19/2011  . Arthritis 12/06/2008  . Back pain 12/06/2008  . CARPAL TUNNEL SYNDROME, HX OF 12/06/2008    Ladean Raya, PTA 09/07/19 11:11 AM  [facility] [place], [place], [address] Phone: [phone]   Fax:  [phone]  Name: Michael Mcclure MRN: [id number] Date of Birth: [birth year]

## 2019-09-12 ENCOUNTER — Ambulatory Visit: Payer: Medicare Other | Admitting: Physical Therapy

## 2019-09-12 ENCOUNTER — Encounter: Payer: Self-pay | Admitting: Physical Therapy

## 2019-09-12 ENCOUNTER — Other Ambulatory Visit: Payer: Self-pay

## 2019-09-12 DIAGNOSIS — R296 Repeated falls: Secondary | ICD-10-CM | POA: Diagnosis not present

## 2019-09-12 DIAGNOSIS — R262 Difficulty in walking, not elsewhere classified: Secondary | ICD-10-CM

## 2019-09-12 DIAGNOSIS — M6281 Muscle weakness (generalized): Secondary | ICD-10-CM

## 2019-09-12 DIAGNOSIS — G8929 Other chronic pain: Secondary | ICD-10-CM

## 2019-09-12 NOTE — Therapy (Signed)
[facility] [place], [place], [address] Phone: [phone]   Fax:  [phone]  Physical Therapy Treatment  Patient Details  Name: Michael Mcclure MRN: [id number] Date of Birth: [birth year] Referring Provider (PT): Claretta Fraise, MD   Encounter Date: 09/12/2019  PT End of Session - 09/12/19 1018    Visit Number  11    Number of Visits  16    Date for PT Re-Evaluation  10/06/19    Authorization Type  Medicare KX modifier after visit 15    PT Start Time  0946    PT Stop Time  1028    PT Time Calculation (min)  42 min    Activity Tolerance  Patient tolerated treatment well    Behavior During Therapy  [facility] for tasks assessed/performed       Past Medical History:  Diagnosis Date  . Actinic keratitis 2017  . Anxiety   . Arthritis   . BPH (benign prostatic hyperplasia)   . Cataract   . GERD (gastroesophageal reflux disease)   . Hemorrhoids   . HOH (hard of hearing)   . Hx of colonic polyps   . Hyperlipidemia   . Hypothyroidism   . Keratosis, seborrheic 2017  . Pelvic fracture ([place])   . Thyroid disease     Past Surgical History:  Procedure Laterality Date  . ANKLE SURGERY    . BACK SURGERY    . CARPAL TUNNEL RELEASE Right    x2  . CATARACT EXTRACTION W/PHACO Right 05/18/2016   Procedure: CATARACT EXTRACTION PHACO AND INTRAOCULAR LENS PLACEMENT RIGHT EYE; CDE:  5.35;  Surgeon: Tonny Branch, MD;  Location: [facility];  Service: Ophthalmology;  Laterality: Right;  . ROTATOR CUFF REPAIR    . SHOULDER SURGERY Right    x 2   . WRIST SURGERY Left    fracture    There were no vitals filed for this visit.  Subjective Assessment - 09/12/19 1122    Subjective  COVID19 screening performed prior to entering the building.  Patient reported feeling good today.    How long can you walk comfortably?  10 minutes    Patient Stated Goals  walk without hurting in my right side, stop losing my balance    Currently in Pain?   No/denies         [facility] PT Assessment - 09/12/19 0001      Assessment   Medical Diagnosis  balance deficits, h/o falls    Referring Provider (PT)  Claretta Fraise, MD                   [facility] Adult PT Treatment/Exercise - 09/12/19 0001      Exercises   Exercises  Knee/Hip      Knee/Hip Exercises: Aerobic   Nustep  L4 x 15 mins seat 12      Knee/Hip Exercises: Seated   Long Arc Quad  Strengthening;Both;10 reps;3 sets    [employer] Weight  4 lbs.    Ball Squeeze  x2 minutes    Clamshell with TheraBand  Green   3x10   Marching  Strengthening;3 sets;10 reps;Both;Weights    Marching Weights  4 lbs.    Hamstring Curl  Strengthening;Both;3 sets;10 reps    Hamstring Limitations  green t-band          Balance Exercises - 09/12/19 1010      Balance Exercises: Standing   Rockerboard  Anterior/posterior;Other time (comment)   x30  Sidestepping  5 reps    Marching Limitations  x30    Heel Raises Limitations  x30    Sit to Stand Time  2x10             PT Long Term Goals - 09/07/19 1045      PT LONG TERM GOAL #1   Title  Pt will be independent in his HEP and progression.    Baseline  issued standing hip exercises today    Time  8    Period  Weeks    Status  Achieved      PT LONG TERM GOAL #2   Title  Pt will improve his BERG balance score from 30/56 to >/= 40/56.    Baseline  14/56 09/07/19    Time  8    Period  Weeks    Status  Achieved      PT LONG TERM GOAL #3   Title  Pt will be able to perform 5 times sit to stand with no UE support in </= 15 seconds.    Baseline  18.67 seconds using UE support     Met 08-24-19 14 secs    Time  8    Period  Weeks    Status  Achieved      PT LONG TERM GOAL #4   Title  Pt will be able to walk 20 minutes with pain in his low back of </=2/10.    Time  8    Period  Weeks    Status  On-going   up to 8/10 09/07/19           Plan - 09/12/19 1021    Clinical Impression Statement  Patient responded  well to therapy session with no reports of pain. Patient demonstrated good form with all TEs and balance activities and with no loss of balance. Cont. POC consisting of LE strengthening and balance activities to address ongoing goals.    Personal Factors and Comorbidities  Age;Comorbidity 3+    Comorbidities  anxiety, HOH, hypothyroidism, pelvic fx, BPH, cataracts, R ankle surgery, carpel tunner, R rotator cuff surgery, L wrist fx, OA, lumbar surgery    Examination-Activity Limitations  Squat;Stairs;Stand;Lift    Examination-Participation Restrictions  Community Activity;Laundry;Yard Work    Stability/Clinical Decision Making  Stable/Uncomplicated    Clinical Decision Making  Moderate    Rehab Potential  Good    PT Frequency  2x / week    PT Duration  8 weeks    PT Treatment/Interventions  ADLs/Self Care Home Management;Cryotherapy;Electrical Stimulation;Moist Heat;Ultrasound;Gait training;Stair training;Functional mobility training;Therapeutic activities;Therapeutic exercise;Balance training;Neuromuscular re-education;Patient/family education;Manual techniques;Passive range of motion    PT Next Visit Plan  cont with POC for lumbar strengthening and stretching per tolerance/balance    Consulted and Agree with Plan of Care  Patient       Patient will benefit from skilled therapeutic intervention in order to improve the following deficits and impairments:  Abnormal gait, Pain, Decreased mobility, Decreased strength, Postural dysfunction, Decreased activity tolerance, Impaired flexibility, Decreased balance  Visit Diagnosis: Repeated falls  Difficulty in walking, not elsewhere classified  Muscle weakness (generalized)  Chronic right-sided low back pain with right-sided sciatica     Problem List Patient Active Problem List   Diagnosis Date Noted  . Impairment of balance 08/03/2019  . Osteoporosis 01/19/2017  . Osteoarthritis of hip 11/02/2016  . CKD (chronic kidney disease) stage 3,  GFR 30-59 ml/min 10/02/2016  . Venous insufficiency 05/11/2016  . Dizziness 02/07/2016  .  Sensorineural hearing loss (SNHL) of both ears 10/10/2015  . Hypothyroidism 09/20/2014  . Neuropathy 02/14/2014  . Lumbar scoliosis 02/14/2014  . Anterolisthesis 02/14/2014  . GERD (gastroesophageal reflux disease) 03/19/2011  . Hyperlipidemia 03/19/2011  . Neurogenic bladder 03/19/2011  . BPH (benign prostatic hyperplasia) 03/19/2011  . Arthritis 12/06/2008  . Back pain 12/06/2008  . CARPAL TUNNEL SYNDROME, HX OF 12/06/2008    Gabriela Eves, PT, DPT 09/12/2019, 11:35 AM  [facility] [address] [place], [place], [address] Phone: [phone]   Fax:  [phone]  Name: KYCEN SPALLA MRN: [id number] Date of Birth: 1931/12/14

## 2019-09-14 ENCOUNTER — Other Ambulatory Visit: Payer: Self-pay

## 2019-09-14 ENCOUNTER — Encounter: Payer: Self-pay | Admitting: *Deleted

## 2019-09-14 ENCOUNTER — Ambulatory Visit: Payer: Medicare Other | Admitting: *Deleted

## 2019-09-14 DIAGNOSIS — R296 Repeated falls: Secondary | ICD-10-CM | POA: Diagnosis not present

## 2019-09-14 DIAGNOSIS — R262 Difficulty in walking, not elsewhere classified: Secondary | ICD-10-CM

## 2019-09-14 DIAGNOSIS — M6281 Muscle weakness (generalized): Secondary | ICD-10-CM

## 2019-09-14 NOTE — Therapy (Signed)
[facility] [place], [place], [address] Phone: [phone]   Fax:  [phone]  Physical Therapy Treatment  Patient Details  Name: Michael Mcclure MRN: [id number] Date of Birth: 1932-08-06 Referring Provider (PT): Claretta Fraise, MD   Encounter Date: 09/14/2019  PT End of Session - 09/14/19 1008    Visit Number  12    Number of Visits  16    Date for PT Re-Evaluation  10/06/19    Authorization Type  Medicare KX modifier after visit 15    PT Start Time  0950    PT Stop Time  1036    PT Time Calculation (min)  46 min       Past Medical History:  Diagnosis Date  . Actinic keratitis 2017  . Anxiety   . Arthritis   . BPH (benign prostatic hyperplasia)   . Cataract   . GERD (gastroesophageal reflux disease)   . Hemorrhoids   . HOH (hard of hearing)   . Hx of colonic polyps   . Hyperlipidemia   . Hypothyroidism   . Keratosis, seborrheic 2017  . Pelvic fracture ([place])   . Thyroid disease     Past Surgical History:  Procedure Laterality Date  . ANKLE SURGERY    . BACK SURGERY    . CARPAL TUNNEL RELEASE Right    x2  . CATARACT EXTRACTION W/PHACO Right 05/18/2016   Procedure: CATARACT EXTRACTION PHACO AND INTRAOCULAR LENS PLACEMENT RIGHT EYE; CDE:  5.35;  Surgeon: Tonny Branch, MD;  Location: [facility];  Service: Ophthalmology;  Laterality: Right;  . ROTATOR CUFF REPAIR    . SHOULDER SURGERY Right    x 2   . WRIST SURGERY Left    fracture    There were no vitals filed for this visit.  Subjective Assessment - 09/14/19 1005    Subjective  COVID19 screening performed prior to entering the building.  Patient reported feeling good today, but my back hurt a lot yesterday    How long can you walk comfortably?  10 minutes    Patient Stated Goals  walk without hurting in my right side, stop losing my balance    Currently in Pain?  No/denies    Pain Location  Back    Pain Orientation  Right;Lower    Pain Descriptors /  Indicators  Sore    Pain Onset  More than a month ago                       [facility] Adult PT Treatment/Exercise - 09/14/19 0001      Exercises   Exercises  Knee/Hip      Knee/Hip Exercises: Aerobic   Nustep  L4 x 15 mins seat 12      Knee/Hip Exercises: Seated   Long Arc Quad  Strengthening;Both;10 reps;3 sets    [employer] Weight  4 lbs.    Ball Squeeze  x2 minutes    Clamshell with TheraBand  Green   3x10   Hamstring Curl  Strengthening;Both;3 sets;10 reps    Hamstring Limitations  green t-band          Balance Exercises - 09/14/19 1032      Balance Exercises: Standing   Rockerboard  Anterior/posterior;Other time (comment)    5 mins UE assist PRN/ SBA   Sidestepping  5 reps    Marching Limitations  x30    Heel Raises Limitations  x30    Sit to Stand  Time  2x10   from raised plinth no UE asiist            PT Long Term Goals - 09/07/19 1045      PT LONG TERM GOAL #1   Title  Pt will be independent in his HEP and progression.    Baseline  issued standing hip exercises today    Time  8    Period  Weeks    Status  Achieved      PT LONG TERM GOAL #2   Title  Pt will improve his BERG balance score from 30/56 to >/= 40/56.    Baseline  14/56 09/07/19    Time  8    Period  Weeks    Status  Achieved      PT LONG TERM GOAL #3   Title  Pt will be able to perform 5 times sit to stand with no UE support in </= 15 seconds.    Baseline  18.67 seconds using UE support     Met 08-24-19 14 secs    Time  8    Period  Weeks    Status  Achieved      PT LONG TERM GOAL #4   Title  Pt will be able to walk 20 minutes with pain in his low back of </=2/10.    Time  8    Period  Weeks    Status  On-going   up to 8/10 09/07/19           Plan - 09/14/19 1156    Clinical Impression Statement  Pt arrived today doing fairly well with minimal pain and was able to progress with strengthening and balance act.'s. He showed much improvement with balance  with decreased assistance.    Comorbidities  anxiety, HOH, hypothyroidism, pelvic fx, BPH, cataracts, R ankle surgery, carpel tunner, R rotator cuff surgery, L wrist fx, OA, lumbar surgery    Examination-Participation Restrictions  Community Activity;Laundry;Yard Work    Stability/Clinical Decision Making  Stable/Uncomplicated    Rehab Potential  Good    PT Frequency  2x / week    PT Duration  8 weeks    PT Treatment/Interventions  ADLs/Self Care Home Management;Cryotherapy;Electrical Stimulation;Moist Heat;Ultrasound;Gait training;Stair training;Functional mobility training;Therapeutic activities;Therapeutic exercise;Balance training;Neuromuscular re-education;Patient/family education;Manual techniques;Passive range of motion    PT Next Visit Plan  cont with POC for lumbar strengthening and stretching per tolerance/balance    PT Home Exercise Plan  heel raises, hip abd, marching all with UE support    Consulted and Agree with Plan of Care  Patient       Patient will benefit from skilled therapeutic intervention in order to improve the following deficits and impairments:  Abnormal gait, Pain, Decreased mobility, Decreased strength, Postural dysfunction, Decreased activity tolerance, Impaired flexibility, Decreased balance  Visit Diagnosis: Repeated falls  Difficulty in walking, not elsewhere classified  Muscle weakness (generalized)     Problem List Patient Active Problem List   Diagnosis Date Noted  . Impairment of balance 08/03/2019  . Osteoporosis 01/19/2017  . Osteoarthritis of hip 11/02/2016  . CKD (chronic kidney disease) stage 3, GFR 30-59 ml/min 10/02/2016  . Venous insufficiency 05/11/2016  . Dizziness 02/07/2016  . Sensorineural hearing loss (SNHL) of both ears 10/10/2015  . Hypothyroidism 09/20/2014  . Neuropathy 02/14/2014  . Lumbar scoliosis 02/14/2014  . Anterolisthesis 02/14/2014  . GERD (gastroesophageal reflux disease) 03/19/2011  . Hyperlipidemia 03/19/2011   . Neurogenic bladder 03/19/2011  . BPH (benign prostatic  hyperplasia) 03/19/2011  . Arthritis 12/06/2008  . Back pain 12/06/2008  . CARPAL TUNNEL SYNDROME, HX OF 12/06/2008    Johnwesley Lederman,CHRIS, PTA 09/14/2019, 12:01 PM  [facility] [place], [place], [address] Phone: [phone]   Fax:  [phone]  Name: Michael Mcclure MRN: [id number] Date of Birth: 07/02/1932

## 2019-09-19 ENCOUNTER — Ambulatory Visit: Payer: Medicare Other | Admitting: *Deleted

## 2019-09-19 ENCOUNTER — Encounter: Payer: Self-pay | Admitting: *Deleted

## 2019-09-19 ENCOUNTER — Other Ambulatory Visit: Payer: Self-pay

## 2019-09-19 DIAGNOSIS — M6281 Muscle weakness (generalized): Secondary | ICD-10-CM

## 2019-09-19 DIAGNOSIS — R296 Repeated falls: Secondary | ICD-10-CM

## 2019-09-19 DIAGNOSIS — G8929 Other chronic pain: Secondary | ICD-10-CM

## 2019-09-19 DIAGNOSIS — R262 Difficulty in walking, not elsewhere classified: Secondary | ICD-10-CM

## 2019-09-19 NOTE — Therapy (Signed)
[facility] [place], [place], [address] Phone: [phone]   Fax:  [phone]  Physical Therapy Treatment  Patient Details  Name: Michael Mcclure MRN: [id number] Date of Birth: [birth year] Referring Provider (PT): Claretta Fraise, MD   Encounter Date: 09/19/2019  PT End of Session - 09/19/19 0954    Visit Number  13    Number of Visits  16    Date for PT Re-Evaluation  10/06/19    Authorization Type  Medicare KX modifier after visit 15    PT Start Time  0946    PT Stop Time  1033    PT Time Calculation (min)  47 min       Past Medical History:  Diagnosis Date  . Actinic keratitis 2017  . Anxiety   . Arthritis   . BPH (benign prostatic hyperplasia)   . Cataract   . GERD (gastroesophageal reflux disease)   . Hemorrhoids   . HOH (hard of hearing)   . Hx of colonic polyps   . Hyperlipidemia   . Hypothyroidism   . Keratosis, seborrheic 2017  . Pelvic fracture ([facility])   . Thyroid disease     Past Surgical History:  Procedure Laterality Date  . ANKLE SURGERY    . BACK SURGERY    . CARPAL TUNNEL RELEASE Right    x2  . CATARACT EXTRACTION W/PHACO Right 05/18/2016   Procedure: CATARACT EXTRACTION PHACO AND INTRAOCULAR LENS PLACEMENT RIGHT EYE; CDE:  5.35;  Surgeon: Tonny Branch, MD;  Location: [facility];  Service: Ophthalmology;  Laterality: Right;  . ROTATOR CUFF REPAIR    . SHOULDER SURGERY Right    x 2   . WRIST SURGERY Left    fracture    There were no vitals filed for this visit.  Subjective Assessment - 09/19/19 0951    Subjective  COVID19 screening performed prior to entering the building. LB 5/10 today    Limitations  Lifting    How long can you walk comfortably?  10 minutes    Patient Stated Goals  walk without hurting in my right side, stop losing my balance    Currently in Pain?  Yes    Pain Score  5     Pain Location  Back    Pain Orientation  Right;Lower    Pain Descriptors / Indicators   Aching;Sore    Pain Type  Chronic pain    Pain Onset  More than a month ago                       [facility] Adult PT Treatment/Exercise - 09/19/19 0001      Exercises   Exercises  Knee/Hip      Knee/Hip Exercises: Aerobic   Nustep  L4 x 15 mins seat 12      Knee/Hip Exercises: Seated   Long Arc Quad  Strengthening;Both;10 reps;3 sets    [employer] Weight  4 lbs.    Ball Squeeze  3x10 hold 3-5 secs    Clamshell with TheraBand  Green   3x10   Hamstring Curl  Strengthening;Both;3 sets;10 reps    Hamstring Limitations  green t-band      Knee/Hip Exercises: Supine   Bridges  Strengthening;10 reps;3 sets          Balance Exercises - 09/19/19 0957      Balance Exercises: Standing   Rockerboard  Anterior/posterior;Other time (comment)    5 mins UE assist  PRN/ SBA   Sit to Stand Time  2x10                                                                                                    3x10 from plinth        PT Long Term Goals - 09/07/19 1045      PT LONG TERM GOAL #1   Title  Pt will be independent in his HEP and progression.    Baseline  issued standing hip exercises today    Time  8    Period  Weeks    Status  Achieved      PT LONG TERM GOAL #2   Title  Pt will improve his BERG balance score from 30/56 to >/= 40/56.    Baseline  14/56 09/07/19    Time  8    Period  Weeks    Status  Achieved      PT LONG TERM GOAL #3   Title  Pt will be able to perform 5 times sit to stand with no UE support in </= 15 seconds.    Baseline  18.67 seconds using UE support     Met 08-24-19 14 secs    Time  8    Period  Weeks    Status  Achieved      PT LONG TERM GOAL #4   Title  Pt will be able to walk 20 minutes with pain in his low back of </=2/10.    Time  8    Period  Weeks    Status  On-going   up to 8/10 09/07/19           Plan - 09/19/19 1019    Clinical Impression Statement  Pt arrived today not doing as well due to 5/10 LBP. Hewas able to  perform strengthening exs, but unable to perform all the standing exs due to pain. Bridges were added to core strengthening exs and Pt did well. Added to HEP.    Personal Factors and Comorbidities  Age;Comorbidity 3+    Comorbidities  anxiety, HOH, hypothyroidism, pelvic fx, BPH, cataracts, R ankle surgery, carpel tunner, R rotator cuff surgery, L wrist fx, OA, lumbar surgery    Examination-Activity Limitations  Squat;Stairs;Stand;Lift    Examination-Participation Restrictions  Community Activity;Laundry;Yard Work    Stability/Clinical Decision Making  Stable/Uncomplicated    Rehab Potential  Good    PT Duration  8 weeks    PT Treatment/Interventions  ADLs/Self Care Home Management;Cryotherapy;Electrical Stimulation;Moist Heat;Ultrasound;Gait training;Stair training;Functional mobility training;Therapeutic activities;Therapeutic exercise;Balance training;Neuromuscular re-education;Patient/family education;Manual techniques;Passive range of motion    PT Next Visit Plan  cont with POC for lumbar strengthening and stretching per tolerance/balance    PT Home Exercise Plan  heel raises, hip abd, marching all with UE support    Consulted and Agree with Plan of Care  Patient       Patient will benefit from skilled therapeutic intervention in order to improve the following deficits and impairments:  Abnormal gait, Pain, Decreased mobility, Decreased strength, Postural dysfunction, Decreased activity tolerance, Impaired flexibility,  Decreased balance  Visit Diagnosis: Repeated falls  Difficulty in walking, not elsewhere classified  Muscle weakness (generalized)  Chronic right-sided low back pain with right-sided sciatica     Problem List Patient Active Problem List   Diagnosis Date Noted  . Impairment of balance 08/03/2019  . Osteoporosis 01/19/2017  . Osteoarthritis of hip 11/02/2016  . CKD (chronic kidney disease) stage 3, GFR 30-59 ml/min 10/02/2016  . Venous insufficiency 05/11/2016   . Dizziness 02/07/2016  . Sensorineural hearing loss (SNHL) of both ears 10/10/2015  . Hypothyroidism 09/20/2014  . Neuropathy 02/14/2014  . Lumbar scoliosis 02/14/2014  . Anterolisthesis 02/14/2014  . GERD (gastroesophageal reflux disease) 03/19/2011  . Hyperlipidemia 03/19/2011  . Neurogenic bladder 03/19/2011  . BPH (benign prostatic hyperplasia) 03/19/2011  . Arthritis 12/06/2008  . Back pain 12/06/2008  . CARPAL TUNNEL SYNDROME, HX OF 12/06/2008    Dayle Mcnerney,CHRIS, PTA 09/19/2019, 10:40 AM  [facility] [place], [place], [address] Phone: [phone]   Fax:  [phone]  Name: Michael Mcclure MRN: [id number] Date of Birth: [birth year]

## 2019-09-21 ENCOUNTER — Ambulatory Visit: Payer: Medicare Other | Admitting: *Deleted

## 2019-09-21 ENCOUNTER — Other Ambulatory Visit: Payer: Self-pay

## 2019-09-21 DIAGNOSIS — R296 Repeated falls: Secondary | ICD-10-CM | POA: Diagnosis not present

## 2019-09-21 DIAGNOSIS — M6281 Muscle weakness (generalized): Secondary | ICD-10-CM

## 2019-09-21 DIAGNOSIS — M5441 Lumbago with sciatica, right side: Secondary | ICD-10-CM

## 2019-09-21 DIAGNOSIS — R262 Difficulty in walking, not elsewhere classified: Secondary | ICD-10-CM

## 2019-09-21 DIAGNOSIS — G8929 Other chronic pain: Secondary | ICD-10-CM

## 2019-09-21 NOTE — Therapy (Signed)
[facility] [place], [place], [address] Phone: [phone]   Fax:  [phone]  Physical Therapy Treatment  Patient Details  Name: Michael Mcclure MRN: [id number] Date of Birth: August 08, 1932 Referring Provider (PT): Claretta Fraise, MD   Encounter Date: 09/21/2019  PT End of Session - 09/21/19 0951    Visit Number  14    Number of Visits  16    Date for PT Re-Evaluation  10/06/19    Authorization Type  Medicare KX modifier after visit 15    PT Start Time  0945    PT Stop Time  1035    PT Time Calculation (min)  50 min       Past Medical History:  Diagnosis Date  . Actinic keratitis 2017  . Anxiety   . Arthritis   . BPH (benign prostatic hyperplasia)   . Cataract   . GERD (gastroesophageal reflux disease)   . Hemorrhoids   . HOH (hard of hearing)   . Hx of colonic polyps   . Hyperlipidemia   . Hypothyroidism   . Keratosis, seborrheic 2017  . Pelvic fracture ([place])   . Thyroid disease     Past Surgical History:  Procedure Laterality Date  . ANKLE SURGERY    . BACK SURGERY    . CARPAL TUNNEL RELEASE Right    x2  . CATARACT EXTRACTION W/PHACO Right 05/18/2016   Procedure: CATARACT EXTRACTION PHACO AND INTRAOCULAR LENS PLACEMENT RIGHT EYE; CDE:  5.35;  Surgeon: Tonny Branch, MD;  Location: [facility];  Service: Ophthalmology;  Laterality: Right;  . ROTATOR CUFF REPAIR    . SHOULDER SURGERY Right    x 2   . WRIST SURGERY Left    fracture    There were no vitals filed for this visit.  Subjective Assessment - 09/21/19 0949    Subjective  COVID19 screening performed prior to entering the building. LB 7/10 today, both sides and my hips    Limitations  Lifting    How long can you walk comfortably?  10 minutes    Currently in Pain?  Yes    Pain Score  7     Pain Location  Back    Pain Orientation  Right;Lower    Pain Descriptors / Indicators  Aching;Sore    Pain Type  Chronic pain    Pain Onset  More than a month  ago                       [facility] Adult PT Treatment/Exercise - 09/21/19 0001      Exercises   Exercises  Knee/Hip      Knee/Hip Exercises: Seated   Long Arc Quad  Strengthening;Both;10 reps;4 sets    Long Arc Quad Weight  4 lbs.    Ball Squeeze  3x10 hold 3-5 secs    Clamshell with TheraBand  Green   3x10   Hamstring Curl  Strengthening;Both;3 sets;10 reps    Hamstring Limitations  green t-band      Knee/Hip Exercises: Supine   Bridges  Strengthening;10 reps;3 sets      Modalities   Modalities  Moist Heat;Electrical Stimulation      Moist Heat Therapy   Number Minutes Moist Heat  15 Minutes    Moist Heat Location  Lumbar Spine      Electrical Stimulation   Electrical Stimulation Location  Bil LB paras IFC x 15 mins 80-'150hz'$     Electrical Stimulation Goals  Pain          Balance Exercises - 09/21/19 0958      Balance Exercises: Standing   Rockerboard  Anterior/posterior;Other time (comment)    5 mins UE assist PRN/ SBA   Other Standing Exercises  ^in toe taps 3x10 for balance             PT Long Term Goals - 09/07/19 1045      PT LONG TERM GOAL #1   Title  Pt will be independent in his HEP and progression.    Baseline  issued standing hip exercises today    Time  8    Period  Weeks    Status  Achieved      PT LONG TERM GOAL #2   Title  Pt will improve his BERG balance score from 30/56 to >/= 40/56.    Baseline  14/56 09/07/19    Time  8    Period  Weeks    Status  Achieved      PT LONG TERM GOAL #3   Title  Pt will be able to perform 5 times sit to stand with no UE support in </= 15 seconds.    Baseline  18.67 seconds using UE support     Met 08-24-19 14 secs    Time  8    Period  Weeks    Status  Achieved      PT LONG TERM GOAL #4   Title  Pt will be able to walk 20 minutes with pain in his low back of </=2/10.    Time  8    Period  Weeks    Status  On-going   up to 8/10 09/07/19           Plan - 09/21/19 0951     Clinical Impression Statement  Pt arrived today not doing very well due to increased LB pain. He was able to complete some strengthening exs and balance , but opted out of Nustep due to pain. HMP and IFC applied to LB to decrease pain. Normal modality response with decreased pain end of session    Comorbidities  anxiety, HOH, hypothyroidism, pelvic fx, BPH, cataracts, R ankle surgery, carpel tunner, R rotator cuff surgery, L wrist fx, OA, lumbar surgery    Examination-Participation Restrictions  Community Activity;Laundry;Yard Work    Stability/Clinical Decision Making  Stable/Uncomplicated    Rehab Potential  Good    PT Frequency  2x / week    PT Duration  8 weeks    PT Treatment/Interventions  ADLs/Self Care Home Management;Cryotherapy;Electrical Stimulation;Moist Heat;Ultrasound;Gait training;Stair training;Functional mobility training;Therapeutic activities;Therapeutic exercise;Balance training;Neuromuscular re-education;Patient/family education;Manual techniques;Passive range of motion    PT Next Visit Plan  cont with POC for lumbar strengthening and stretching per tolerance/balance    PT Home Exercise Plan  heel raises, hip abd, marching all with UE support    Consulted and Agree with Plan of Care  Patient       Patient will benefit from skilled therapeutic intervention in order to improve the following deficits and impairments:  Abnormal gait, Pain, Decreased mobility, Decreased strength, Postural dysfunction, Decreased activity tolerance, Impaired flexibility, Decreased balance  Visit Diagnosis: Repeated falls  Difficulty in walking, not elsewhere classified  Muscle weakness (generalized)  Chronic right-sided low back pain with right-sided sciatica     Problem List Patient Active Problem List   Diagnosis Date Noted  . Impairment of balance 08/03/2019  . Osteoporosis 01/19/2017  . Osteoarthritis of hip  11/02/2016  . CKD (chronic kidney disease) stage 3, GFR 30-59 ml/min  10/02/2016  . Venous insufficiency 05/11/2016  . Dizziness 02/07/2016  . Sensorineural hearing loss (SNHL) of both ears 10/10/2015  . Hypothyroidism 09/20/2014  . Neuropathy 02/14/2014  . Lumbar scoliosis 02/14/2014  . Anterolisthesis 02/14/2014  . GERD (gastroesophageal reflux disease) 03/19/2011  . Hyperlipidemia 03/19/2011  . Neurogenic bladder 03/19/2011  . BPH (benign prostatic hyperplasia) 03/19/2011  . Arthritis 12/06/2008  . Back pain 12/06/2008  . CARPAL TUNNEL SYNDROME, HX OF 12/06/2008    RAMSEUR,CHRIS , PTA 09/21/2019, 10:58 AM  [facility] [place], [place], [address] Phone: [phone]   Fax:  [phone]  Name: Michael Mcclure MRN: [id number] Date of Birth: Feb 19, 1932

## 2019-09-26 ENCOUNTER — Other Ambulatory Visit: Payer: Self-pay

## 2019-09-26 ENCOUNTER — Encounter: Payer: Self-pay | Admitting: *Deleted

## 2019-09-26 ENCOUNTER — Ambulatory Visit: Payer: Medicare Other | Admitting: *Deleted

## 2019-09-26 DIAGNOSIS — R296 Repeated falls: Secondary | ICD-10-CM | POA: Diagnosis not present

## 2019-09-26 DIAGNOSIS — G8929 Other chronic pain: Secondary | ICD-10-CM

## 2019-09-26 DIAGNOSIS — R262 Difficulty in walking, not elsewhere classified: Secondary | ICD-10-CM

## 2019-09-26 DIAGNOSIS — M6281 Muscle weakness (generalized): Secondary | ICD-10-CM

## 2019-09-26 NOTE — Therapy (Signed)
[facility] [place], [place], [address] Phone: [phone]   Fax:  [phone]  Physical Therapy Treatment  Patient Details  Name: Michael Mcclure MRN: [id number] Date of Birth: December 07, 1932 Referring Provider (PT): Claretta Fraise, MD   Encounter Date: 09/26/2019  PT End of Session - 09/26/19 1012    Visit Number  15    Number of Visits  16    Date for PT Re-Evaluation  10/06/19    Authorization Type  Medicare KX modifier after visit 15    PT Start Time  0945    PT Stop Time  1305    PT Time Calculation (min)  200 min    Equipment Utilized During Treatment  Gait belt    Activity Tolerance  Patient tolerated treatment well    Behavior During Therapy  [facility] for tasks assessed/performed       Past Medical History:  Diagnosis Date  . Actinic keratitis 2017  . Anxiety   . Arthritis   . BPH (benign prostatic hyperplasia)   . Cataract   . GERD (gastroesophageal reflux disease)   . Hemorrhoids   . HOH (hard of hearing)   . Hx of colonic polyps   . Hyperlipidemia   . Hypothyroidism   . Keratosis, seborrheic 2017  . Pelvic fracture ([place])   . Thyroid disease     Past Surgical History:  Procedure Laterality Date  . ANKLE SURGERY    . BACK SURGERY    . CARPAL TUNNEL RELEASE Right    x2  . CATARACT EXTRACTION W/PHACO Right 05/18/2016   Procedure: CATARACT EXTRACTION PHACO AND INTRAOCULAR LENS PLACEMENT RIGHT EYE; CDE:  5.35;  Surgeon: Tonny Branch, MD;  Location: [facility];  Service: Ophthalmology;  Laterality: Right;  . ROTATOR CUFF REPAIR    . SHOULDER SURGERY Right    x 2   . WRIST SURGERY Left    fracture    There were no vitals filed for this visit.  Subjective Assessment - 09/26/19 0953    Subjective  COVID19 screening performed prior to entering the building. LB 7/10 again, but last Rx really helped    Limitations  Lifting    How long can you walk comfortably?  10 minutes    Patient Stated Goals  walk without  hurting in my right side, stop losing my balance    Currently in Pain?  Yes    Pain Score  7     Pain Location  Back    Pain Orientation  Right;Lower    Pain Descriptors / Indicators  Aching;Sore    Pain Type  Chronic pain    Pain Onset  More than a month ago                       [facility] Adult PT Treatment/Exercise - 09/26/19 0001      Exercises   Exercises  Knee/Hip      Knee/Hip Exercises: Standing   Rocker Board  5 minutes      Knee/Hip Exercises: Seated   Long Arc Quad  Strengthening;Both;10 reps;4 sets    Long Arc Quad Weight  4 lbs.    Ball Squeeze  3x10 hold 3-5 secs    Clamshell with TheraBand  Green    Hamstring Curl  Strengthening;Both;3 sets;10 reps    Hamstring Limitations  green t-band      Knee/Hip Exercises: Supine   Bridges  Strengthening;10 reps;3 sets  Modalities   Modalities  Moist Heat;Electrical Stimulation      Moist Heat Therapy   Number Minutes Moist Heat  15 Minutes    Moist Heat Location  Lumbar Spine      Electrical Stimulation   Electrical Stimulation Location  Bil LB paras IFC x 15 mins 80-150hz    Electrical Stimulation Goals  Pain                  PT Long Term Goals - 09/07/19 1045      PT LONG TERM GOAL #1   Title  Pt will be independent in his HEP and progression.    Baseline  issued standing hip exercises today    Time  8    Period  Weeks    Status  Achieved      PT LONG TERM GOAL #2   Title  Pt will improve his BERG balance score from 30/56 to >/= 40/56.    Baseline  14/56 09/07/19    Time  8    Period  Weeks    Status  Achieved      PT LONG TERM GOAL #3   Title  Pt will be able to perform 5 times sit to stand with no UE support in </= 15 seconds.    Baseline  18.67 seconds using UE support     Met 08-24-19 14 secs    Time  8    Period  Weeks    Status  Achieved      PT LONG TERM GOAL #4   Title  Pt will be able to walk 20 minutes with pain in his low back of </=2/10.    Time  8     Period  Weeks    Status  On-going   up to 8/10 09/07/19           Plan - 09/26/19 1025    Clinical Impression Statement  Pt arrived today doing ok, but still with LBP and requested to only do some of the exs and use heat and estim again. He was able to perform some balance and LE strengthening exs without complaints. Normal modality response today with decreased pain end of session    Comorbidities  anxiety, HOH, hypothyroidism, pelvic fx, BPH, cataracts, R ankle surgery, carpel tunner, R rotator cuff surgery, L wrist fx, OA, lumbar surgery    Examination-Activity Limitations  Squat;Stairs;Stand;Lift    Examination-Participation Restrictions  Community Activity;Laundry;Yard Work    Stability/Clinical Decision Making  Stable/Uncomplicated    Rehab Potential  Good    PT Frequency  2x / week    PT Duration  8 weeks    PT Treatment/Interventions  ADLs/Self Care Home Management;Cryotherapy;Electrical Stimulation;Moist Heat;Ultrasound;Gait training;Stair training;Functional mobility training;Therapeutic activities;Therapeutic exercise;Balance training;Neuromuscular re-education;Patient/family education;Manual techniques;Passive range of motion    PT Next Visit Plan  DC after next visit    PT Home Exercise Plan  heel raises, hip abd, marching all with UE support       Patient will benefit from skilled therapeutic intervention in order to improve the following deficits and impairments:  Abnormal gait, Pain, Decreased mobility, Decreased strength, Postural dysfunction, Decreased activity tolerance, Impaired flexibility, Decreased balance  Visit Diagnosis: Repeated falls  Difficulty in walking, not elsewhere classified  Muscle weakness (generalized)  Chronic right-sided low back pain with right-sided sciatica     Problem List Patient Active Problem List   Diagnosis Date Noted  . Impairment of balance 08/03/2019  . Osteoporosis 01/19/2017  .  Osteoarthritis of hip 11/02/2016  . CKD  (chronic kidney disease) stage 3, GFR 30-59 ml/min 10/02/2016  . Venous insufficiency 05/11/2016  . Dizziness 02/07/2016  . Sensorineural hearing loss (SNHL) of both ears 10/10/2015  . Hypothyroidism 09/20/2014  . Neuropathy 02/14/2014  . Lumbar scoliosis 02/14/2014  . Anterolisthesis 02/14/2014  . GERD (gastroesophageal reflux disease) 03/19/2011  . Hyperlipidemia 03/19/2011  . Neurogenic bladder 03/19/2011  . BPH (benign prostatic hyperplasia) 03/19/2011  . Arthritis 12/06/2008  . Back pain 12/06/2008  . CARPAL TUNNEL SYNDROME, HX OF 12/06/2008    RAMSEUR,CHRIS, PTA 09/26/2019, 10:54 AM  [facility] [address] [place], [place], [address] Phone: [phone]   Fax:  [phone]  Name: OJANI BERENSON MRN: [id number] Date of Birth: 10/13/1932

## 2019-09-28 ENCOUNTER — Encounter: Payer: Self-pay | Admitting: Physical Therapy

## 2019-09-28 ENCOUNTER — Other Ambulatory Visit: Payer: Self-pay

## 2019-09-28 ENCOUNTER — Ambulatory Visit: Payer: Medicare Other | Admitting: Physical Therapy

## 2019-09-28 DIAGNOSIS — R296 Repeated falls: Secondary | ICD-10-CM | POA: Diagnosis not present

## 2019-09-28 DIAGNOSIS — M6281 Muscle weakness (generalized): Secondary | ICD-10-CM

## 2019-09-28 DIAGNOSIS — M5441 Lumbago with sciatica, right side: Secondary | ICD-10-CM

## 2019-09-28 DIAGNOSIS — G8929 Other chronic pain: Secondary | ICD-10-CM

## 2019-09-28 DIAGNOSIS — R262 Difficulty in walking, not elsewhere classified: Secondary | ICD-10-CM

## 2019-09-28 NOTE — Therapy (Signed)
[facility] [place], [place], [address] Phone: [phone]   Fax:  [phone]  Physical Therapy Treatment PHYSICAL THERAPY DISCHARGE SUMMARY  Visits from Start of Care: 16  Current functional level related to goals / functional outcomes: See below   Remaining deficits: See goals   Education / Equipment: HEP Plan: Patient agrees to discharge.  Patient goals were met. Patient is being discharged due to meeting the stated rehab goals.  ?????  Gabriela Eves, PT, DPT 09/28/19    Patient Details  Name: Michael Mcclure MRN: [id number] Date of Birth: Sep 03, 1932 Referring Provider (PT): Claretta Fraise, MD   Encounter Date: 09/28/2019  PT End of Session - 09/28/19 0953    Visit Number  16    Number of Visits  16    Date for PT Re-Evaluation  10/06/19    Authorization Type  Medicare KX modifier after visit 15    PT Start Time  0948    PT Stop Time  1035    PT Time Calculation (min)  47 min    Activity Tolerance  Patient tolerated treatment well    Behavior During Therapy  [facility] for tasks assessed/performed       Past Medical History:  Diagnosis Date  . Actinic keratitis 2017  . Anxiety   . Arthritis   . BPH (benign prostatic hyperplasia)   . Cataract   . GERD (gastroesophageal reflux disease)   . Hemorrhoids   . HOH (hard of hearing)   . Hx of colonic polyps   . Hyperlipidemia   . Hypothyroidism   . Keratosis, seborrheic 2017  . Pelvic fracture ([place])   . Thyroid disease     Past Surgical History:  Procedure Laterality Date  . ANKLE SURGERY    . BACK SURGERY    . CARPAL TUNNEL RELEASE Right    x2  . CATARACT EXTRACTION W/PHACO Right 05/18/2016   Procedure: CATARACT EXTRACTION PHACO AND INTRAOCULAR LENS PLACEMENT RIGHT EYE; CDE:  5.35;  Surgeon: Tonny Branch, MD;  Location: [facility];  Service: Ophthalmology;  Laterality: Right;  . ROTATOR CUFF REPAIR    . SHOULDER SURGERY Right    x 2   . WRIST SURGERY  Left    fracture    There were no vitals filed for this visit.  Subjective Assessment - 09/28/19 0952    Subjective  COVID19 screening performed prior to entering the building. Patient reports ongoing low back and hip pain but is ready for discharge    Limitations  Lifting    How long can you walk comfortably?  10 minutes    Patient Stated Goals  walk without hurting in my right side, stop losing my balance    Currently in Pain?  Yes   "a bit more"        [facility] PT Assessment - 09/28/19 0001      Assessment   Medical Diagnosis  balance deficits, h/o falls    Referring Provider (PT)  Claretta Fraise, MD                   [facility] Adult PT Treatment/Exercise - 09/28/19 0001      Knee/Hip Exercises: Standing   Rocker Board  5 minutes      Knee/Hip Exercises: Seated   Long Arc Quad  Strengthening;Both;10 reps;4 sets    [employer] Weight  4 lbs.    Ball Squeeze  3x10 hold 3-5 secs    Clamshell with TheraBand  Green   x30   Marching  Strengthening;3 sets;10 reps;Both;Weights    [employer]  4 lbs.    Hamstring Curl  Strengthening;Both;3 sets;10 reps    Hamstring Limitations  green t-band      Knee/Hip Exercises: Supine   Straight Leg Raises  AROM;Both;2 sets;10 reps      Modalities   Modalities  Moist Heat;Electrical Stimulation      Moist Heat Therapy   Number Minutes Moist Heat  15 Minutes    Moist Heat Location  Lumbar Spine      Electrical Stimulation   Electrical Stimulation Location  Bil LB paras IFC x 15 mins 80-'150hz'$     Electrical Stimulation Goals  Pain                  PT Long Term Goals - 09/28/19 1017      PT LONG TERM GOAL #1   Title  Pt will be independent in his HEP and progression.    Time  8    Period  Weeks    Status  Achieved      PT LONG TERM GOAL #2   Title  Pt will improve his BERG balance score from 30/56 to >/= 40/56.    Baseline  14/56 09/07/19    Time  8    Period  Weeks    Status  Achieved      PT LONG  TERM GOAL #3   Title  Pt will be able to perform 5 times sit to stand with no UE support in </= 15 seconds.    Baseline  18.67 seconds using UE support     Met 08-24-19 14 secs    Time  8    Period  Weeks    Status  Achieved      PT LONG TERM GOAL #4   Title  Pt will be able to walk 20 minutes with pain in his low back of </=2/10.    Time  8    Period  Weeks    Status  Partially Met   walking for 30 minutes but with >2/10 pain           Plan - 09/28/19 1310    Clinical Impression Statement  Patient arrives with ongoing low back pain but was able to complete all exercises without increase of pain. Patient demonstrated good form with all exercises. Patient and PT discussed HEP to which patient reported understanding and compliance. Patient's goal all met. Patient to be discharged today.    Personal Factors and Comorbidities  Age;Comorbidity 3+    Comorbidities  anxiety, HOH, hypothyroidism, pelvic fx, BPH, cataracts, R ankle surgery, carpel tunner, R rotator cuff surgery, L wrist fx, OA, lumbar surgery    Examination-Activity Limitations  Squat;Stairs;Stand;Lift    Examination-Participation Restrictions  Community Activity;Laundry;Yard Work    Stability/Clinical Decision Making  Stable/Uncomplicated    Clinical Decision Making  Moderate    Rehab Potential  Good    PT Frequency  2x / week    PT Duration  8 weeks    PT Treatment/Interventions  ADLs/Self Care Home Management;Cryotherapy;Electrical Stimulation;Moist Heat;Ultrasound;Gait training;Stair training;Functional mobility training;Therapeutic activities;Therapeutic exercise;Balance training;Neuromuscular re-education;Patient/family education;Manual techniques;Passive range of motion    PT Next Visit Plan  DC    PT Home Exercise Plan  heel raises, hip abd, marching all with UE support    Consulted and Agree with Plan of Care  Patient       Patient will benefit from skilled  therapeutic intervention in order to improve the  following deficits and impairments:  Abnormal gait, Pain, Decreased mobility, Decreased strength, Postural dysfunction, Decreased activity tolerance, Impaired flexibility, Decreased balance  Visit Diagnosis: Repeated falls  Difficulty in walking, not elsewhere classified  Muscle weakness (generalized)  Chronic right-sided low back pain with right-sided sciatica     Problem List Patient Active Problem List   Diagnosis Date Noted  . Impairment of balance 08/03/2019  . Osteoporosis 01/19/2017  . Osteoarthritis of hip 11/02/2016  . CKD (chronic kidney disease) stage 3, GFR 30-59 ml/min 10/02/2016  . Venous insufficiency 05/11/2016  . Dizziness 02/07/2016  . Sensorineural hearing loss (SNHL) of both ears 10/10/2015  . Hypothyroidism 09/20/2014  . Neuropathy 02/14/2014  . Lumbar scoliosis 02/14/2014  . Anterolisthesis 02/14/2014  . GERD (gastroesophageal reflux disease) 03/19/2011  . Hyperlipidemia 03/19/2011  . Neurogenic bladder 03/19/2011  . BPH (benign prostatic hyperplasia) 03/19/2011  . Arthritis 12/06/2008  . Back pain 12/06/2008  . CARPAL TUNNEL SYNDROME, HX OF 12/06/2008    Gabriela Eves, PT, DPT 09/28/2019, 1:12 PM  [facility] [address] [place], [place], [address] Phone: [phone]   Fax:  [phone]  Name: Michael Mcclure MRN: [id number] Date of Birth: Jun 06, 1932

## 2019-10-12 ENCOUNTER — Other Ambulatory Visit: Payer: Self-pay

## 2019-10-12 ENCOUNTER — Ambulatory Visit (INDEPENDENT_AMBULATORY_CARE_PROVIDER_SITE_OTHER): Payer: No Typology Code available for payment source | Admitting: Physician Assistant

## 2019-10-12 ENCOUNTER — Encounter: Payer: Self-pay | Admitting: Physician Assistant

## 2019-10-12 DIAGNOSIS — B9689 Other specified bacterial agents as the cause of diseases classified elsewhere: Secondary | ICD-10-CM | POA: Diagnosis not present

## 2019-10-12 DIAGNOSIS — J988 Other specified respiratory disorders: Secondary | ICD-10-CM | POA: Diagnosis not present

## 2019-10-12 MED ORDER — ALBUTEROL SULFATE HFA 108 (90 BASE) MCG/ACT IN AERS: 2.0000 | INHALATION_SPRAY | Freq: Four times a day (QID) | RESPIRATORY_TRACT | 0 refills | Status: DC | PRN

## 2019-10-12 MED ORDER — AZITHROMYCIN 250 MG PO TABS: ORAL_TABLET | ORAL | 0 refills | Status: DC

## 2019-10-12 MED ORDER — SPACER/AERO-HOLDING CHAMBERS DEVI: 1.0000 | 0 refills | Status: AC | PRN

## 2019-10-12 MED ORDER — BENZONATATE 100 MG PO CAPS: 100.0000 mg | ORAL_CAPSULE | Freq: Three times a day (TID) | ORAL | 0 refills | Status: DC | PRN

## 2019-10-12 NOTE — Progress Notes (Signed)
Virtual Visit via Telephone Note  I connected with Michael Mcclure on 10/12/19 at 4:39 PM by telephone and verified that I am speaking with the correct person using two identifiers. Michael Mcclure is currently located at home and nobody is currently with him during this visit. The provider, Gwenlyn Fudge, FNP is located in their office at time of visit.  I discussed the limitations, risks, security and privacy concerns of performing an evaluation and management service by telephone and the availability of in person appointments. I also discussed with the patient that there may be a patient responsible charge related to this service. The patient expressed understanding and agreed to proceed.  Subjective: PCP: Mechele Claude, MD  Chief Complaint  Patient presents with  . URI   Patient complains of cough, sore throat and wheezing. Onset of symptoms was over a week ago, gradually worsening since that time. He is drinking plenty of fluids. Evaluation to date: none. Treatment to date: Vicks. He does not smoke. Patient has not had recent close contact with someone who has tested positive for COVID-19.     ROS: Per HPI  Current Outpatient Medications:  .  alendronate (FOSAMAX) 70 MG tablet, Take 1 tablet (70 mg total) by mouth once a week. Take with a full glass of water on an empty stomach., Disp: 12 tablet, Rfl: 1 .  cholecalciferol (VITAMIN D) 1000 units tablet, Take 1,000 Units by mouth daily. , Disp: , Rfl:  .  citalopram (CELEXA) 20 MG tablet, TAKE 1 TABLET BY MOUTH EVERY DAY, Disp: 90 tablet, Rfl: 0 .  clopidogrel (PLAVIX) 75 MG tablet, TAKE 1 TABLET BY MOUTH EVERY DAY, Disp: 90 tablet, Rfl: 1 .  diclofenac (FLECTOR) 1.3 % PTCH, PLACE 1 PATCH ONTO THE SKIN 2 (TWO) TIMES DAILY AS NEEDED FOR PAIN, Disp: 60 patch, Rfl: 0 .  ezetimibe-simvastatin (VYTORIN) 10-40 MG tablet, TAKE 1 TABLET BY MOUTH EVERYDAY AT BEDTIME, Disp: 90 tablet, Rfl: 0 .  Flaxseed, Linseed, (FLAX SEED OIL) 1000 MG  CAPS, Take 1 capsule by mouth daily. , Disp: , Rfl:  .  gabapentin (NEURONTIN) 800 MG tablet, TAKE 2 TABLETS BY MOUTH 2 TIMES DAILY., Disp: 360 tablet, Rfl: 0 .  levothyroxine (SYNTHROID, LEVOTHROID) 25 MCG tablet, TAKE 1 TABLET (25 MCG TOTAL) BY MOUTH DAILY., Disp: 90 tablet, Rfl: 1 .  Multiple Vitamins-Minerals (MULTIVITAMIN WITH MINERALS) tablet, Take 1 tablet by mouth daily., Disp: , Rfl:  .  nabumetone (RELAFEN) 500 MG tablet, Take 2 tablets (1,000 mg total) by mouth 2 (two) times daily. For muscle and joint pain, Disp: 360 tablet, Rfl: 1 .  pantoprazole (PROTONIX) 40 MG tablet, TAKE 1 TABLET BY MOUTH EVERY DAY, Disp: 90 tablet, Rfl: 1 .  tamsulosin (FLOMAX) 0.4 MG CAPS capsule, TAKE 1 CAPSULE BY MOUTH EVERY DAY, Disp: 90 capsule, Rfl: 0 .  triamcinolone ointment (KENALOG) 0.5 %, Apply 1 application topically 2 (two) times daily as needed., Disp: 30 g, Rfl: 2 .  XIIDRA 5 % SOLN, , Disp: , Rfl:   Allergies  Allergen Reactions  . Hydrocodone Shortness Of Breath  . Ciprofloxacin Other (See Comments)    Question caused short of breath.   Past Medical History:  Diagnosis Date  . Actinic keratitis 2017  . Anxiety   . Arthritis   . BPH (benign prostatic hyperplasia)   . Cataract   . GERD (gastroesophageal reflux disease)   . Hemorrhoids   . HOH (hard of hearing)   . Hx of  colonic polyps   . Hyperlipidemia   . Hypothyroidism   . Keratosis, seborrheic 2017  . Pelvic fracture ([place])   . Thyroid disease     Observations/Objective: A&O  No respiratory distress or wheezing audible over the phone Mood, judgement, and thought processes all WNL  Assessment and Plan: 1. Bacterial respiratory infection - Continue Vicks.  - azithromycin (ZITHROMAX Z-PAK) 250 MG tablet; Take 2 tablets (500 mg) PO today, then 1 tablet (250 mg) PO daily x4 days.  Dispense: 6 tablet; Refill: 0 - albuterol (VENTOLIN HFA) 108 (90 Base) MCG/ACT inhaler; Inhale 2 puffs into the lungs every 6 (six) hours as needed  for wheezing or shortness of breath.  Dispense: 18 g; Refill: 0 - Spacer/Aero-Holding Chambers DEVI; 1 Device by Does not apply route as needed.  Dispense: 1 Device; Refill: 0 - benzonatate (TESSALON PERLES) 100 MG capsule; Take 1 capsule (100 mg total) by mouth 3 (three) times daily as needed for cough.  Dispense: 30 capsule; Refill: 0   Follow Up Instructions:  I discussed the assessment and treatment plan with the patient. The patient was provided an opportunity to ask questions and all were answered. The patient agreed with the plan and demonstrated an understanding of the instructions.   The patient was advised to call back or seek an in-person evaluation if the symptoms worsen or if the condition fails to improve as anticipated.  The above assessment and management plan was discussed with the patient. The patient verbalized understanding of and has agreed to the management plan. Patient is aware to call the clinic if symptoms persist or worsen. Patient is aware when to return to the clinic for a follow-up visit. Patient educated on when it is appropriate to go to the emergency department.   Time call ended: 4:45 PM  I provided 9 minutes of non-face-to-face time during this encounter.  Hendricks Limes, MSN, APRN, FNP-C [facility] 10/12/19

## 2019-10-17 ENCOUNTER — Other Ambulatory Visit: Payer: Self-pay | Admitting: Family Medicine

## 2019-10-18 ENCOUNTER — Encounter: Payer: Self-pay | Admitting: Family Medicine

## 2019-10-18 ENCOUNTER — Ambulatory Visit (INDEPENDENT_AMBULATORY_CARE_PROVIDER_SITE_OTHER): Payer: No Typology Code available for payment source | Admitting: Family Medicine

## 2019-10-18 DIAGNOSIS — J40 Bronchitis, not specified as acute or chronic: Secondary | ICD-10-CM

## 2019-10-18 DIAGNOSIS — J329 Chronic sinusitis, unspecified: Secondary | ICD-10-CM | POA: Diagnosis not present

## 2019-10-18 MED ORDER — MOXIFLOXACIN HCL 400 MG PO TABS: 400.0000 mg | ORAL_TABLET | Freq: Every day | ORAL | 0 refills | Status: DC

## 2019-10-18 NOTE — Progress Notes (Signed)
Subjective:    Patient ID: Michael Mcclure, male    DOB: May 07, 1932, 85 y.o.   MRN: [id number]   HPI: Michael Mcclure is a 85 y.o. male presenting for a lot of cough. No fever. Malaise. Voice gets thick. Sputum is green. Some dyspnea- not that much.    Depression screen [facility] 2/9 02/14/2019 12/16/2018 11/08/2018 08/31/2018 05/06/2018  Decreased Interest 0 0 0 0 0  Down, Depressed, Hopeless 0 0 0 0 0  PHQ - 2 Score 0 0 0 0 0  Altered sleeping - - - - -  Tired, decreased energy - - - - -  Change in appetite - - - - -  Feeling bad or failure about yourself  - - - - -  Trouble concentrating - - - - -  Moving slowly or fidgety/restless - - - - -  Suicidal thoughts - - - - -  PHQ-9 Score - - - - -  Some recent data might be hidden     Relevant past medical, surgical, family and social history reviewed and updated as indicated.  Interim medical history since our last visit reviewed. Allergies and medications reviewed and updated.  ROS:  Review of Systems  Constitutional: Negative for activity change, appetite change, chills and fever.  HENT: Positive for congestion, postnasal drip, rhinorrhea and sinus pressure. Negative for ear discharge, ear pain, hearing loss, nosebleeds, sneezing and trouble swallowing.   Respiratory: Negative for chest tightness and shortness of breath.   Cardiovascular: Negative for chest pain and palpitations.  Skin: Negative for rash.     Social History   Tobacco Use  Smoking Status Never Smoker  Smokeless Tobacco Never Used       Objective:     Wt Readings from Last 3 Encounters:  02/14/19 194 lb 2 oz (88.1 kg)  12/16/18 193 lb 2 oz (87.6 kg)  11/08/18 186 lb 9.6 oz (84.6 kg)     Exam deferred. Pt. Harboring due to COVID 19. Phone visit performed.   Assessment & Plan:   1. Sinobronchitis     Meds ordered this encounter  Medications  . moxifloxacin (AVELOX) 400 MG tablet    Sig: Take 1 tablet (400 mg total) by mouth daily. Take all of  these, for infection    Dispense:  7 tablet    Refill:  0    No orders of the defined types were placed in this encounter.     Diagnoses and all orders for this visit:  Sinobronchitis  Other orders -     moxifloxacin (AVELOX) 400 MG tablet; Take 1 tablet (400 mg total) by mouth daily. Take all of these, for infection    Virtual Visit via telephone Note  I discussed the limitations, risks, security and privacy concerns of performing an evaluation and management service by telephone and the availability of in person appointments. The patient was identified with two identifiers. Pt.expressed understanding and agreed to proceed. Pt. Is at home. Dr. Livia Snellen is in his office.  Follow Up Instructions:   I discussed the assessment and treatment plan with the patient. The patient was provided an opportunity to ask questions and all were answered. The patient agreed with the plan and demonstrated an understanding of the instructions.   The patient was advised to call back or seek an in-person evaluation if the symptoms worsen or if the condition fails to improve as anticipated.   Total minutes including chart review and phone contact time: 9  Follow up plan: Return if symptoms worsen or fail to improve.  Michael Fraise, MD [place]

## 2019-10-27 ENCOUNTER — Other Ambulatory Visit: Payer: Self-pay | Admitting: Family Medicine

## 2019-11-23 ENCOUNTER — Other Ambulatory Visit: Payer: Self-pay | Admitting: Family Medicine

## 2019-11-23 DIAGNOSIS — N400 Enlarged prostate without lower urinary tract symptoms: Secondary | ICD-10-CM

## 2019-11-24 ENCOUNTER — Other Ambulatory Visit: Payer: Self-pay | Admitting: Family Medicine

## 2019-11-24 DIAGNOSIS — M810 Age-related osteoporosis without current pathological fracture: Secondary | ICD-10-CM

## 2019-11-25 ENCOUNTER — Other Ambulatory Visit: Payer: Self-pay | Admitting: Family Medicine

## 2019-11-25 DIAGNOSIS — B9689 Other specified bacterial agents as the cause of diseases classified elsewhere: Secondary | ICD-10-CM

## 2019-11-25 DIAGNOSIS — J988 Other specified respiratory disorders: Secondary | ICD-10-CM

## 2019-11-26 ENCOUNTER — Other Ambulatory Visit: Payer: Self-pay | Admitting: Family Medicine

## 2019-11-26 DIAGNOSIS — F419 Anxiety disorder, unspecified: Secondary | ICD-10-CM

## 2019-11-26 DIAGNOSIS — E785 Hyperlipidemia, unspecified: Secondary | ICD-10-CM

## 2019-11-27 ENCOUNTER — Other Ambulatory Visit: Payer: Self-pay | Admitting: Family Medicine

## 2019-12-16 ENCOUNTER — Other Ambulatory Visit: Payer: Self-pay | Admitting: Family Medicine

## 2019-12-16 DIAGNOSIS — J988 Other specified respiratory disorders: Secondary | ICD-10-CM

## 2019-12-16 DIAGNOSIS — B9689 Other specified bacterial agents as the cause of diseases classified elsewhere: Secondary | ICD-10-CM

## 2019-12-17 ENCOUNTER — Other Ambulatory Visit: Payer: Self-pay | Admitting: Family Medicine

## 2019-12-17 DIAGNOSIS — N400 Enlarged prostate without lower urinary tract symptoms: Secondary | ICD-10-CM

## 2019-12-18 ENCOUNTER — Other Ambulatory Visit: Payer: Self-pay | Admitting: Family Medicine

## 2019-12-18 NOTE — Telephone Encounter (Signed)
Stacks. NTBS 30 days given 11/23/19 

## 2019-12-29 ENCOUNTER — Encounter: Payer: Self-pay | Admitting: Family Medicine

## 2019-12-29 ENCOUNTER — Ambulatory Visit (INDEPENDENT_AMBULATORY_CARE_PROVIDER_SITE_OTHER): Payer: No Typology Code available for payment source | Admitting: Family Medicine

## 2019-12-29 DIAGNOSIS — E785 Hyperlipidemia, unspecified: Secondary | ICD-10-CM | POA: Diagnosis not present

## 2019-12-29 DIAGNOSIS — F419 Anxiety disorder, unspecified: Secondary | ICD-10-CM

## 2019-12-29 DIAGNOSIS — K219 Gastro-esophageal reflux disease without esophagitis: Secondary | ICD-10-CM

## 2019-12-29 DIAGNOSIS — M1611 Unilateral primary osteoarthritis, right hip: Secondary | ICD-10-CM

## 2019-12-29 DIAGNOSIS — G8929 Other chronic pain: Secondary | ICD-10-CM

## 2019-12-29 DIAGNOSIS — M549 Dorsalgia, unspecified: Secondary | ICD-10-CM | POA: Diagnosis not present

## 2019-12-29 DIAGNOSIS — M810 Age-related osteoporosis without current pathological fracture: Secondary | ICD-10-CM

## 2019-12-29 DIAGNOSIS — N400 Enlarged prostate without lower urinary tract symptoms: Secondary | ICD-10-CM

## 2019-12-29 DIAGNOSIS — M4126 Other idiopathic scoliosis, lumbar region: Secondary | ICD-10-CM

## 2019-12-29 DIAGNOSIS — E039 Hypothyroidism, unspecified: Secondary | ICD-10-CM

## 2019-12-29 MED ORDER — PANTOPRAZOLE SODIUM 40 MG PO TBEC: 40.0000 mg | DELAYED_RELEASE_TABLET | Freq: Every day | ORAL | 1 refills | Status: DC

## 2019-12-29 MED ORDER — EZETIMIBE-SIMVASTATIN 10-40 MG PO TABS: 1.0000 | ORAL_TABLET | Freq: Every day | ORAL | 1 refills | Status: DC

## 2019-12-29 MED ORDER — DICLOFENAC EPOLAMINE 1.3 % EX PTCH: 1.0000 | MEDICATED_PATCH | Freq: Two times a day (BID) | CUTANEOUS | 11 refills | Status: DC

## 2019-12-29 MED ORDER — CITALOPRAM HYDROBROMIDE 20 MG PO TABS: 20.0000 mg | ORAL_TABLET | Freq: Every day | ORAL | 1 refills | Status: DC

## 2019-12-29 MED ORDER — GABAPENTIN 800 MG PO TABS: 1600.0000 mg | ORAL_TABLET | Freq: Two times a day (BID) | ORAL | 1 refills | Status: DC

## 2019-12-29 MED ORDER — TRIAMCINOLONE ACETONIDE 0.5 % EX OINT: TOPICAL_OINTMENT | CUTANEOUS | 2 refills | Status: DC

## 2019-12-29 MED ORDER — CLOPIDOGREL BISULFATE 75 MG PO TABS: 75.0000 mg | ORAL_TABLET | Freq: Every day | ORAL | 1 refills | Status: DC

## 2019-12-29 MED ORDER — TAMSULOSIN HCL 0.4 MG PO CAPS: 0.4000 mg | ORAL_CAPSULE | Freq: Every day | ORAL | 5 refills | Status: DC

## 2019-12-29 MED ORDER — LEVOTHYROXINE SODIUM 25 MCG PO TABS: ORAL_TABLET | ORAL | 1 refills | Status: DC

## 2019-12-29 MED ORDER — NABUMETONE 500 MG PO TABS: 1000.0000 mg | ORAL_TABLET | Freq: Two times a day (BID) | ORAL | 5 refills | Status: DC

## 2019-12-29 NOTE — Progress Notes (Signed)
Subjective:    Patient ID: Michael Mcclure, male    DOB: 1932/09/10, 86 y.o.   MRN: [id number]   HPI: Michael Mcclure is a 86 y.o. male presenting for  follow-up on  thyroid. The patient has a history of hypothyroidism for many years. It has been stable recently. Pt. denies any change in  voice, loss of hair, heat or cold intolerance. Energy level has been adequate to good. Patient denies constipation and diarrhea. No myxedema. Medication is as noted below. Verified that pt is taking it daily on an empty stomach. Well tolerated. Patient in for follow-up of GERD. Currently asymptomatic taking  PPI daily. There is no chest pain or heartburn. No hematemesis and no melena. No dysphagia or choking. Onset is remote. Progression is stable. Complicating factors, none. Anxiety stable with current dose of celexa.   in for follow-up of elevated cholesterol. Doing well without complaints on current medication. Denies side effects of statin including myalgia and arthralgia and nausea. Currently no chest pain, shortness of breath or other cardiovascular related symptoms noted.       Depression screen [facility] 2/9 02/14/2019 12/16/2018 11/08/2018 08/31/2018 05/06/2018  Decreased Interest 0 0 0 0 0  Down, Depressed, Hopeless 0 0 0 0 0  PHQ - 2 Score 0 0 0 0 0  Altered sleeping - - - - -  Tired, decreased energy - - - - -  Change in appetite - - - - -  Feeling bad or failure about yourself  - - - - -  Trouble concentrating - - - - -  Moving slowly or fidgety/restless - - - - -  Suicidal thoughts - - - - -  PHQ-9 Score - - - - -  Some recent data might be hidden     Relevant past medical, surgical, family and social history reviewed and updated as indicated.  Interim medical history since our last visit reviewed. Allergies and medications reviewed and updated.  ROS:  Review of Systems  Constitutional: Negative.   HENT: Negative.   Eyes: Negative for visual disturbance.  Respiratory: Negative for  cough and shortness of breath.   Cardiovascular: Negative for chest pain and leg swelling.  Gastrointestinal: Positive for constipation. Negative for abdominal pain, diarrhea, nausea and vomiting.  Genitourinary: Negative for difficulty urinating.  Musculoskeletal: Positive for arthralgias. Negative for myalgias. Back pain: r hip pain.  Skin: Negative for rash.  Neurological: Negative for headaches.  Psychiatric/Behavioral: Negative for sleep disturbance.     Social History   Tobacco Use  Smoking Status Never Smoker  Smokeless Tobacco Never Used       Objective:     Wt Readings from Last 3 Encounters:  02/14/19 194 lb 2 oz (88.1 kg)  12/16/18 193 lb 2 oz (87.6 kg)  11/08/18 186 lb 9.6 oz (84.6 kg)     Exam deferred. Pt. Harboring due to COVID 19. Phone visit performed.   Assessment & Plan:   1. Chronic midline back pain, unspecified back location   2. Anxiety   3. Hyperlipidemia, unspecified hyperlipidemia type   4. Gastroesophageal reflux disease   5. Benign prostatic hyperplasia, unspecified whether lower urinary tract symptoms present   6. Primary osteoarthritis of right hip   7. Age-related osteoporosis without current pathological fracture   8. Acquired hypothyroidism   9. Other idiopathic scoliosis, lumbar region     Meds ordered this encounter  Medications  . diclofenac (FLECTOR) 1.3 % PTCH    Sig: Place  1 patch onto the skin 2 (two) times daily.    Dispense:  60 patch    Refill:  11  . clopidogrel (PLAVIX) 75 MG tablet    Sig: Take 1 tablet (75 mg total) by mouth daily.    Dispense:  90 tablet    Refill:  1  . citalopram (CELEXA) 20 MG tablet    Sig: Take 1 tablet (20 mg total) by mouth daily.    Dispense:  90 tablet    Refill:  1  . ezetimibe-simvastatin (VYTORIN) 10-40 MG tablet    Sig: Take 1 tablet by mouth daily at 6 PM.    Dispense:  90 tablet    Refill:  1  . gabapentin (NEURONTIN) 800 MG tablet    Sig: Take 2 tablets (1,600 mg total) by  mouth 2 (two) times daily.    Dispense:  360 tablet    Refill:  1  . levothyroxine (SYNTHROID) 25 MCG tablet    Sig: TAKE 1 TABLET (25 MCG TOTAL) BY MOUTH DAILY.    Dispense:  90 tablet    Refill:  1  . nabumetone (RELAFEN) 500 MG tablet    Sig: Take 2 tablets (1,000 mg total) by mouth 2 (two) times daily. For muscle and joint pain, especially at the right hip    Dispense:  120 tablet    Refill:  5  . pantoprazole (PROTONIX) 40 MG tablet    Sig: Take 1 tablet (40 mg total) by mouth daily.    Dispense:  90 tablet    Refill:  1  . tamsulosin (FLOMAX) 0.4 MG CAPS capsule    Sig: Take 1 capsule (0.4 mg total) by mouth daily. (Needs to be seen before next refill)    Dispense:  30 capsule    Refill:  5  . triamcinolone ointment (KENALOG) 0.5 %    Sig: APPLY TOPICALLY TWICE DAILY AS NEEDED    Dispense:  30 g    Refill:  2    No orders of the defined types were placed in this encounter.     Diagnoses and all orders for this visit:  Chronic midline back pain, unspecified back location -     gabapentin (NEURONTIN) 800 MG tablet; Take 2 tablets (1,600 mg total) by mouth 2 (two) times daily.  Anxiety -     citalopram (CELEXA) 20 MG tablet; Take 1 tablet (20 mg total) by mouth daily.  Hyperlipidemia, unspecified hyperlipidemia type -     ezetimibe-simvastatin (VYTORIN) 10-40 MG tablet; Take 1 tablet by mouth daily at 6 PM.  Gastroesophageal reflux disease -     pantoprazole (PROTONIX) 40 MG tablet; Take 1 tablet (40 mg total) by mouth daily.  Benign prostatic hyperplasia, unspecified whether lower urinary tract symptoms present -     tamsulosin (FLOMAX) 0.4 MG CAPS capsule; Take 1 capsule (0.4 mg total) by mouth daily. (Needs to be seen before next refill)  Primary osteoarthritis of right hip -     nabumetone (RELAFEN) 500 MG tablet; Take 2 tablets (1,000 mg total) by mouth 2 (two) times daily. For muscle and joint pain, especially at the right hip  Age-related osteoporosis  without current pathological fracture  Acquired hypothyroidism -     levothyroxine (SYNTHROID) 25 MCG tablet; TAKE 1 TABLET (25 MCG TOTAL) BY MOUTH DAILY.  Other idiopathic scoliosis, lumbar region -     gabapentin (NEURONTIN) 800 MG tablet; Take 2 tablets (1,600 mg total) by mouth 2 (two) times daily.  Other  orders -     diclofenac (FLECTOR) 1.3 % PTCH; Place 1 patch onto the skin 2 (two) times daily. -     clopidogrel (PLAVIX) 75 MG tablet; Take 1 tablet (75 mg total) by mouth daily. -     triamcinolone ointment (KENALOG) 0.5 %; APPLY TOPICALLY TWICE DAILY AS NEEDED    Virtual Visit via telephone Note  I discussed the limitations, risks, security and privacy concerns of performing an evaluation and management service by telephone and the availability of in person appointments. The patient was identified with two identifiers. Pt.expressed understanding and agreed to proceed. Pt. Is at home. Dr. Darlyn Read is in his office.  Follow Up Instructions:   I discussed the assessment and treatment plan with the patient. The patient was provided an opportunity to ask questions and all were answered. The patient agreed with the plan and demonstrated an understanding of the instructions.   The patient was advised to call back or seek an in-person evaluation if the symptoms worsen or if the condition fails to improve as anticipated.   Total minutes including chart review and phone contact time: 28   Follow up plan: Return in about 6 months (around 06/27/2020).  Mechele Claude, MD [facility]

## 2020-01-08 ENCOUNTER — Other Ambulatory Visit: Payer: Self-pay | Admitting: Family Medicine

## 2020-01-08 DIAGNOSIS — J988 Other specified respiratory disorders: Secondary | ICD-10-CM

## 2020-01-08 DIAGNOSIS — B9689 Other specified bacterial agents as the cause of diseases classified elsewhere: Secondary | ICD-10-CM

## 2020-01-12 ENCOUNTER — Other Ambulatory Visit: Payer: Self-pay

## 2020-01-15 ENCOUNTER — Other Ambulatory Visit: Payer: Self-pay

## 2020-01-15 ENCOUNTER — Ambulatory Visit (INDEPENDENT_AMBULATORY_CARE_PROVIDER_SITE_OTHER): Payer: Medicare Other | Admitting: Family Medicine

## 2020-01-15 ENCOUNTER — Encounter: Payer: Self-pay | Admitting: Family Medicine

## 2020-01-15 VITALS — BP 121/67 | HR 76 | Temp 99.3°F | Ht 71.0 in | Wt 193.6 lb

## 2020-01-15 DIAGNOSIS — K219 Gastro-esophageal reflux disease without esophagitis: Secondary | ICD-10-CM

## 2020-01-15 DIAGNOSIS — N400 Enlarged prostate without lower urinary tract symptoms: Secondary | ICD-10-CM

## 2020-01-15 DIAGNOSIS — R350 Frequency of micturition: Secondary | ICD-10-CM | POA: Diagnosis not present

## 2020-01-15 DIAGNOSIS — F419 Anxiety disorder, unspecified: Secondary | ICD-10-CM

## 2020-01-15 DIAGNOSIS — M549 Dorsalgia, unspecified: Secondary | ICD-10-CM

## 2020-01-15 DIAGNOSIS — G8929 Other chronic pain: Secondary | ICD-10-CM

## 2020-01-15 DIAGNOSIS — E785 Hyperlipidemia, unspecified: Secondary | ICD-10-CM

## 2020-01-15 DIAGNOSIS — M810 Age-related osteoporosis without current pathological fracture: Secondary | ICD-10-CM

## 2020-01-15 DIAGNOSIS — E039 Hypothyroidism, unspecified: Secondary | ICD-10-CM

## 2020-01-15 DIAGNOSIS — M1611 Unilateral primary osteoarthritis, right hip: Secondary | ICD-10-CM

## 2020-01-15 DIAGNOSIS — M4126 Other idiopathic scoliosis, lumbar region: Secondary | ICD-10-CM

## 2020-01-15 LAB — URINALYSIS, COMPLETE
Bilirubin, UA: NEGATIVE
Glucose, UA: NEGATIVE
Ketones, UA: NEGATIVE
Leukocytes,UA: NEGATIVE
Nitrite, UA: NEGATIVE
Protein,UA: NEGATIVE
RBC, UA: NEGATIVE
Specific Gravity, UA: 1.015 (ref 1.005–1.030)
Urobilinogen, Ur: 0.2 mg/dL (ref 0.2–1.0)
pH, UA: 7 (ref 5.0–7.5)

## 2020-01-15 LAB — MICROSCOPIC EXAMINATION
Bacteria, UA: NONE SEEN
Epithelial Cells (non renal): NONE SEEN /hpf (ref 0–10)
RBC, Urine: NONE SEEN /hpf (ref 0–2)
Renal Epithel, UA: NONE SEEN /hpf

## 2020-01-15 MED ORDER — NABUMETONE 500 MG PO TABS: 1000.0000 mg | ORAL_TABLET | Freq: Two times a day (BID) | ORAL | 5 refills | Status: DC

## 2020-01-15 MED ORDER — LEVOTHYROXINE SODIUM 25 MCG PO TABS: ORAL_TABLET | ORAL | 1 refills | Status: DC

## 2020-01-15 MED ORDER — TAMSULOSIN HCL 0.4 MG PO CAPS: 0.8000 mg | ORAL_CAPSULE | Freq: Every day | ORAL | 1 refills | Status: DC

## 2020-01-15 MED ORDER — ALENDRONATE SODIUM 70 MG PO TABS: ORAL_TABLET | ORAL | 1 refills | Status: AC

## 2020-01-15 MED ORDER — EZETIMIBE-SIMVASTATIN 10-40 MG PO TABS: 1.0000 | ORAL_TABLET | Freq: Every day | ORAL | 1 refills | Status: DC

## 2020-01-15 MED ORDER — DICLOFENAC EPOLAMINE 1.3 % EX PTCH: 1.0000 | MEDICATED_PATCH | Freq: Two times a day (BID) | CUTANEOUS | 11 refills | Status: DC

## 2020-01-15 MED ORDER — PANTOPRAZOLE SODIUM 40 MG PO TBEC: 40.0000 mg | DELAYED_RELEASE_TABLET | Freq: Every day | ORAL | 1 refills | Status: DC

## 2020-01-15 MED ORDER — CLOPIDOGREL BISULFATE 75 MG PO TABS: 75.0000 mg | ORAL_TABLET | Freq: Every day | ORAL | 1 refills | Status: DC

## 2020-01-15 MED ORDER — CITALOPRAM HYDROBROMIDE 20 MG PO TABS: 20.0000 mg | ORAL_TABLET | Freq: Every day | ORAL | 1 refills | Status: DC

## 2020-01-15 MED ORDER — GABAPENTIN 800 MG PO TABS: 1600.0000 mg | ORAL_TABLET | Freq: Two times a day (BID) | ORAL | 1 refills | Status: DC

## 2020-01-15 NOTE — Addendum Note (Signed)
Addended by: Orma Render F on: 01/15/2020 02:11 PM   Modules accepted: Orders

## 2020-01-15 NOTE — Progress Notes (Signed)
Subjective:  Patient ID: Michael Mcclure, male    DOB: 05-29-1932  Age: 86 y.o. MRN: [id number]  CC: Urinary Frequency   HPI Michael Mcclure presents for urination 15-18 times in a 24-hour period of time getting up multiple times a night.  This is in spite of taking tamsulosin 0.4 mg every morning.  Denies dysuria.  He does have some urgency.  Patient presents for follow-up on  thyroid. The patient has a history of hypothyroidism for many years. It has been stable recently. Pt. denies any change in  voice, loss of hair, heat or cold intolerance. Energy level has been adequate to good. Patient denies constipation and diarrhea. No myxedema. Medication is as noted below. Verified that pt is taking it daily on an empty stomach. Well tolerated.  Patient in for follow-up of elevated cholesterol. Doing well without complaints on current medication. Denies side effects of statin including myalgia and arthralgia and nausea. Also in today for liver function testing. Currently no chest pain, shortness of breath or other cardiovascular related symptoms noted.  Patient has been identified as being at risk for osteoporosis and has been laced on Fosamax which he tolerates well as a weekly dose.  Patient denies bleeding as result of the use of Plavix.  Patient has arthritis of multiple joints.  This slows him down some he does have scoliosis as well.  Depression screen [facility] 2/9 01/15/2020 02/14/2019 12/16/2018  Decreased Interest 0 0 0  Down, Depressed, Hopeless 0 0 0  PHQ - 2 Score 0 0 0  Altered sleeping - - -  Tired, decreased energy - - -  Change in appetite - - -  Feeling bad or failure about yourself  - - -  Trouble concentrating - - -  Moving slowly or fidgety/restless - - -  Suicidal thoughts - - -  PHQ-9 Score - - -  Some recent data might be hidden    History Michael Mcclure has a past medical history of Actinic keratitis (2017), Anxiety, Arthritis, BPH (benign prostatic hyperplasia), Cataract,  GERD (gastroesophageal reflux disease), Hemorrhoids, HOH (hard of hearing), colonic polyps, Hyperlipidemia, Hypothyroidism, Keratosis, seborrheic (2017), Pelvic fracture ([place]), and Thyroid disease.   He has a past surgical history that includes Back surgery; Wrist surgery (Left); Shoulder surgery (Right); Rotator cuff repair; Carpal tunnel release (Right); Cataract extraction w/PHACO (Right, 05/18/2016); and Ankle surgery.   His family history includes Colon cancer in his mother; Colon polyps in his mother; Diabetes in his daughter and son; Emphysema in his father and son; Heart disease in his brother, brother, and father.He reports that he has never smoked. He has never used smokeless tobacco. He reports that he does not drink alcohol or use drugs.    ROS Review of Systems  Constitutional: Negative.   HENT: Negative.   Eyes: Negative for visual disturbance.  Respiratory: Negative for cough and shortness of breath.   Cardiovascular: Negative for chest pain and leg swelling.  Gastrointestinal: Negative for abdominal pain, diarrhea, nausea and vomiting.  Genitourinary: Positive for frequency and urgency. Negative for decreased urine volume, difficulty urinating and flank pain.  Musculoskeletal: Positive for arthralgias, back pain and myalgias.  Skin: Negative for rash.  Neurological: Negative for headaches.  Psychiatric/Behavioral: Negative for sleep disturbance.    Objective:  BP 121/67   Pulse 76   Temp 99.3 F (37.4 C) (Temporal)   Ht '5\' 11"'$  (1.803 m)   Wt 193 lb 9.6 oz (87.8 kg)   BMI 27.00 kg/m  BP Readings from Last 3 Encounters:  01/15/20 121/67  02/14/19 119/63  12/16/18 119/62    Wt Readings from Last 3 Encounters:  01/15/20 193 lb 9.6 oz (87.8 kg)  02/14/19 194 lb 2 oz (88.1 kg)  12/16/18 193 lb 2 oz (87.6 kg)     Physical Exam Vitals reviewed.  Constitutional:      General: He is not in acute distress.    Appearance: He is well-developed. He is not  ill-appearing, toxic-appearing or diaphoretic.  HENT:     Head: Normocephalic and atraumatic.     Right Ear: Tympanic membrane and external ear normal. No decreased hearing noted.     Left Ear: Tympanic membrane and external ear normal. No decreased hearing noted.     Nose: Nose normal.     Mouth/Throat:     Pharynx: No oropharyngeal exudate or posterior oropharyngeal erythema.  Eyes:     Conjunctiva/sclera: Conjunctivae normal.     Pupils: Pupils are equal, round, and reactive to light.  Cardiovascular:     Rate and Rhythm: Normal rate and regular rhythm.     Heart sounds: Normal heart sounds. No murmur.  Pulmonary:     Effort: Pulmonary effort is normal. No respiratory distress.     Breath sounds: Normal breath sounds. No wheezing or rales.  Abdominal:     General: Bowel sounds are normal.     Palpations: Abdomen is soft. There is no mass.     Tenderness: There is no abdominal tenderness.  Musculoskeletal:        General: Normal range of motion.     Cervical back: Normal range of motion and neck supple.  Skin:    General: Skin is warm and dry.  Neurological:     Mental Status: He is alert and oriented to person, place, and time.     Deep Tendon Reflexes: Reflexes are normal and symmetric.  Psychiatric:        Behavior: Behavior normal.        Thought Content: Thought content normal.        Judgment: Judgment normal.       Assessment & Plan:   Somtochukwu was seen today for urinary frequency.  Diagnoses and all orders for this visit:  Frequent urination -     Urinalysis, Complete -     Urine Culture; Future -     CBC with Differential/Platelet -     CMP14+EGFR  Benign prostatic hyperplasia, unspecified whether lower urinary tract symptoms present -     tamsulosin (FLOMAX) 0.4 MG CAPS capsule; Take 2 capsules (0.8 mg total) by mouth at bedtime. -     CBC with Differential/Platelet -     CMP14+EGFR -     PSA Total (Reflex To Free)  Age-related osteoporosis without  current pathological fracture -     alendronate (FOSAMAX) 70 MG tablet; TAKE 1 TABLET BY MOUTH ONCE A WEEK WITH A FULL GLASS OF WATER ON AN EMPTY STOMACH. -     CBC with Differential/Platelet -     CMP14+EGFR  Anxiety -     citalopram (CELEXA) 20 MG tablet; Take 1 tablet (20 mg total) by mouth daily. -     CBC with Differential/Platelet -     CMP14+EGFR  Hyperlipidemia, unspecified hyperlipidemia type -     ezetimibe-simvastatin (VYTORIN) 10-40 MG tablet; Take 1 tablet by mouth daily at 6 PM. -     CBC with Differential/Platelet -     CMP14+EGFR -  Lipid panel  Chronic midline back pain, unspecified back location -     gabapentin (NEURONTIN) 800 MG tablet; Take 2 tablets (1,600 mg total) by mouth 2 (two) times daily. -     nabumetone (RELAFEN) 500 MG tablet; Take 2 tablets (1,000 mg total) by mouth 2 (two) times daily. For muscle and joint pain, especially at the right hip -     CBC with Differential/Platelet -     CMP14+EGFR  Other idiopathic scoliosis, lumbar region -     gabapentin (NEURONTIN) 800 MG tablet; Take 2 tablets (1,600 mg total) by mouth 2 (two) times daily. -     nabumetone (RELAFEN) 500 MG tablet; Take 2 tablets (1,000 mg total) by mouth 2 (two) times daily. For muscle and joint pain, especially at the right hip -     CBC with Differential/Platelet -     CMP14+EGFR  Acquired hypothyroidism -     levothyroxine (SYNTHROID) 25 MCG tablet; TAKE 1 TABLET (25 MCG TOTAL) BY MOUTH DAILY. -     CBC with Differential/Platelet -     CMP14+EGFR -     TSH + free T4  Primary osteoarthritis of right hip -     diclofenac (FLECTOR) 1.3 % PTCH; Place 1 patch onto the skin 2 (two) times daily. -     nabumetone (RELAFEN) 500 MG tablet; Take 2 tablets (1,000 mg total) by mouth 2 (two) times daily. For muscle and joint pain, especially at the right hip -     CBC with Differential/Platelet -     CMP14+EGFR  Gastroesophageal reflux disease -     pantoprazole (PROTONIX) 40 MG  tablet; Take 1 tablet (40 mg total) by mouth daily. -     CBC with Differential/Platelet -     CMP14+EGFR  Other orders -     clopidogrel (PLAVIX) 75 MG tablet; Take 1 tablet (75 mg total) by mouth daily.       I have changed Arlana Hove. Starnes's tamsulosin. I am also having him maintain his cholecalciferol, Flax Seed Oil, multivitamin with minerals, Xiidra, Spacer/Aero-Holding Chambers, triamcinolone ointment, albuterol, alendronate, citalopram, clopidogrel, diclofenac, ezetimibe-simvastatin, gabapentin, levothyroxine, nabumetone, and pantoprazole.  Allergies as of 01/15/2020      Reactions   Hydrocodone Shortness Of Breath   Ciprofloxacin Other (See Comments)   Question caused short of breath.      Medication List       Accurate as of January 15, 2020 12:09 PM. If you have any questions, ask your nurse or doctor.        albuterol 108 (90 Base) MCG/ACT inhaler Commonly known as: VENTOLIN HFA TAKE 2 PUFFS BY MOUTH EVERY 6 HOURS AS NEEDED FOR WHEEZE OR SHORTNESS OF BREATH   alendronate 70 MG tablet Commonly known as: FOSAMAX TAKE 1 TABLET BY MOUTH ONCE A WEEK WITH A FULL GLASS OF WATER ON AN EMPTY STOMACH.   cholecalciferol 1000 units tablet Commonly known as: VITAMIN D Take 1,000 Units by mouth daily.   citalopram 20 MG tablet Commonly known as: CELEXA Take 1 tablet (20 mg total) by mouth daily.   clopidogrel 75 MG tablet Commonly known as: PLAVIX Take 1 tablet (75 mg total) by mouth daily.   diclofenac 1.3 % Ptch Commonly known as: Flector Place 1 patch onto the skin 2 (two) times daily.   ezetimibe-simvastatin 10-40 MG tablet Commonly known as: VYTORIN Take 1 tablet by mouth daily at 6 PM.   Flax Seed Oil 1000 MG Caps Take 1 capsule  by mouth daily.   gabapentin 800 MG tablet Commonly known as: NEURONTIN Take 2 tablets (1,600 mg total) by mouth 2 (two) times daily.   levothyroxine 25 MCG tablet Commonly known as: SYNTHROID TAKE 1 TABLET (25 MCG TOTAL) BY  MOUTH DAILY.   multivitamin with minerals tablet Take 1 tablet by mouth daily.   nabumetone 500 MG tablet Commonly known as: RELAFEN Take 2 tablets (1,000 mg total) by mouth 2 (two) times daily. For muscle and joint pain, especially at the right hip   pantoprazole 40 MG tablet Commonly known as: PROTONIX Take 1 tablet (40 mg total) by mouth daily.   Spacer/Aero-Holding [employer] 1 Device by Does not apply route as needed.   tamsulosin 0.4 MG Caps capsule Commonly known as: FLOMAX Take 2 capsules (0.8 mg total) by mouth at bedtime. What changed:   how much to take  when to take this  additional instructions Changed by: Claretta Fraise, MD   triamcinolone ointment 0.5 % Commonly known as: KENALOG APPLY TOPICALLY TWICE DAILY AS NEEDED   Xiidra 5 % Soln Generic drug: Lifitegrast      Patient with multiple complex medical problems which is stable on current medical regimen.  Although he does take quite a bit of medication they do not seem to be causing adverse reactions at this time.  His prostate on the other hand is not adequately treated due to the urinary frequency.  I asked him to increase his tamsulosin 0.4 mg capsule to 2 at bedtime.  He will report back to me if this does not decrease his urinary frequency over the next few weeks.  Consider urology referral should that occur.  Follow-up: Return in about 3 months (around 04/13/2020), or if symptoms worsen or fail to improve.  Claretta Fraise, M.D.

## 2020-01-15 NOTE — Patient Instructions (Signed)
Use Cetaphil lotion for itching and dry skin especially on the forearms.

## 2020-01-16 LAB — URINE CULTURE

## 2020-01-16 NOTE — Progress Notes (Signed)
Hello Caton,  Your lab result is normal and/or stable.Some minor variations that are not significant are commonly marked abnormal, but do not represent any medical problem for you.  Best regards, Saylor Sheckler, M.D.

## 2020-01-18 LAB — LIPID PANEL
Chol/HDL Ratio: 2.6 ratio (ref 0.0–5.0)
Cholesterol, Total: 148 mg/dL (ref 100–199)
HDL: 56 mg/dL (ref >39)
LDL Chol Calc (NIH): 74 mg/dL (ref 0–99)
Triglycerides: 96 mg/dL (ref 0–149)
VLDL Cholesterol Cal: 18 mg/dL (ref 5–40)

## 2020-01-18 LAB — CMP14+EGFR
ALT: 18 IU/L (ref 0–44)
AST: 31 IU/L (ref 0–40)
Albumin/Globulin Ratio: 2.1 (ref 1.2–2.2)
Albumin: 4.2 g/dL (ref 3.6–4.6)
Alkaline Phosphatase: 60 IU/L (ref 39–117)
BUN/Creatinine Ratio: 12 (ref 10–24)
BUN: 17 mg/dL (ref 8–27)
Bilirubin Total: 0.6 mg/dL (ref 0.0–1.2)
CO2: 19 mmol/L — ABNORMAL LOW (ref 20–29)
Calcium: 9.5 mg/dL (ref 8.6–10.2)
Chloride: 103 mmol/L (ref 96–106)
Creatinine, Ser: 1.42 mg/dL — ABNORMAL HIGH (ref 0.76–1.27)
GFR calc Af Amer: 52 mL/min/{1.73_m2} — ABNORMAL LOW (ref >59)
GFR calc non Af Amer: 45 mL/min/{1.73_m2} — ABNORMAL LOW (ref >59)
Globulin, Total: 2 g/dL (ref 1.5–4.5)
Glucose: 141 mg/dL — ABNORMAL HIGH (ref 65–99)
Potassium: 4 mmol/L (ref 3.5–5.2)
Sodium: 139 mmol/L (ref 134–144)
Total Protein: 6.2 g/dL (ref 6.0–8.5)

## 2020-01-18 LAB — PSA TOTAL (REFLEX TO FREE): Prostate Specific Ag, Serum: 1 ng/mL (ref 0.0–4.0)

## 2020-01-18 LAB — CBC WITH DIFFERENTIAL/PLATELET
Basophils Absolute: 0.1 10*3/uL (ref 0.0–0.2)
Basos: 1 %
EOS (ABSOLUTE): 0.2 10*3/uL (ref 0.0–0.4)
Eos: 5 %
Hematocrit: 38 % (ref 37.5–51.0)
Hemoglobin: 13.1 g/dL (ref 13.0–17.7)
Immature Grans (Abs): 0 10*3/uL (ref 0.0–0.1)
Immature Granulocytes: 0 %
Lymphocytes Absolute: 1.1 10*3/uL (ref 0.7–3.1)
Lymphs: 24 %
MCH: 31.3 pg (ref 26.6–33.0)
MCHC: 34.5 g/dL (ref 31.5–35.7)
MCV: 91 fL (ref 79–97)
Monocytes Absolute: 0.5 10*3/uL (ref 0.1–0.9)
Monocytes: 10 %
Neutrophils Absolute: 2.8 10*3/uL (ref 1.4–7.0)
Neutrophils: 60 %
Platelets: 155 10*3/uL (ref 150–450)
RBC: 4.19 x10E6/uL (ref 4.14–5.80)
RDW: 12.5 % (ref 11.6–15.4)
WBC: 4.7 10*3/uL (ref 3.4–10.8)

## 2020-01-18 LAB — TSH+FREE T4
Free T4: 1.48 ng/dL (ref 0.82–1.77)
TSH: 3.9 u[IU]/mL (ref 0.450–4.500)

## 2020-02-02 ENCOUNTER — Ambulatory Visit (INDEPENDENT_AMBULATORY_CARE_PROVIDER_SITE_OTHER): Payer: No Typology Code available for payment source | Admitting: *Deleted

## 2020-02-02 VITALS — Ht 71.0 in | Wt 193.6 lb

## 2020-02-02 DIAGNOSIS — Z0000 Encounter for general adult medical examination without abnormal findings: Secondary | ICD-10-CM | POA: Diagnosis not present

## 2020-02-02 NOTE — Progress Notes (Signed)
MEDICARE ANNUAL WELLNESS VISIT  02/02/2020  Telephone Visit Disclaimer This Medicare AWV was conducted by telephone due to national recommendations for restrictions regarding the COVID-19 Pandemic (e.g. social distancing).  I verified, using two identifiers, that I am speaking with Michael Mcclure or their authorized healthcare agent. I discussed the limitations, risks, security, and privacy concerns of performing an evaluation and management service by telephone and the potential availability of an in-person appointment in the future. The patient expressed understanding and agreed to proceed.   Subjective:  Michael Mcclure is a 86 y.o. male patient of Stacks, Broadus John, MD who had a Medicare Annual Wellness Visit today via telephone. Aubry is Retired and lives with their son. he has 4 children. he reports that he is socially active and does interact with friends/family regularly. he is moderately physically active and enjoys watching sports.  Patient Care Team: Mechele Claude, MD as PCP - General (Family Medicine) Trey Sailors, MD as Consulting Physician (Neurosurgery) Peggye Pitt, MD as Consulting Physician (Pain Medicine) Rollene Rotunda, MD as Consulting Physician (Cardiology) Marcelino Duster, MD as Consulting Physician (Dermatology)  Advanced Directives 02/02/2020 08/10/2019 02/03/2018 01/24/2018 01/19/2017 05/18/2016 05/12/2016  Does Patient Have a Medical Advance Directive? No No Yes Yes Yes Yes Yes  Type of Advance Directive - Designer, television/film set Power of [employer] Power of [place];Living will Healthcare Power of [place];Living will  Does patient want to make changes to medical advance directive? - - - - No - Patient declined No - Patient declined -  Copy of Healthcare Power of Attorney in Chart? - - - No - copy requested No - copy requested No - copy requested No - copy requested  Would patient like information on creating a  medical advance directive? Yes (MAU/Ambulatory/Procedural Areas - Information given) No - Patient declined - - - - [facility] Utilization Over the Past 12 Months: # of hospitalizations or ER visits: 0 # of surgeries: 0  Review of Systems    Patient reports that his overall health is unchanged compared to last year.  History obtained from chart review and the patient General ROS: negative  Patient Reported Readings (BP, Pulse, CBG, Weight, etc) none  Pain Assessment Pain : No/denies pain     Current Medications & Allergies (verified) Allergies as of 02/02/2020      Reactions   Hydrocodone Shortness Of Breath   Ciprofloxacin Other (See Comments)   Question caused short of breath.      Medication List       Accurate as of February 02, 2020  8:41 AM. If you have any questions, ask your nurse or doctor.        albuterol 108 (90 Base) MCG/ACT inhaler Commonly known as: VENTOLIN HFA TAKE 2 PUFFS BY MOUTH EVERY 6 HOURS AS NEEDED FOR WHEEZE OR SHORTNESS OF BREATH   alendronate 70 MG tablet Commonly known as: FOSAMAX TAKE 1 TABLET BY MOUTH ONCE A WEEK WITH A FULL GLASS OF WATER ON AN EMPTY STOMACH.   cholecalciferol 1000 units tablet Commonly known as: VITAMIN D Take 1,000 Units by mouth daily.   citalopram 20 MG tablet Commonly known as: CELEXA Take 1 tablet (20 mg total) by mouth daily.   clopidogrel 75 MG tablet Commonly known as: PLAVIX Take 1 tablet (75 mg total) by mouth daily.   diclofenac 1.3 % Ptch Commonly known as: Flector Place 1 patch onto the skin 2 (two) times  daily.   ezetimibe-simvastatin 10-40 MG tablet Commonly known as: VYTORIN Take 1 tablet by mouth daily at 6 PM.   Flax Seed Oil 1000 MG Caps Take 1 capsule by mouth daily.   gabapentin 800 MG tablet Commonly known as: NEURONTIN Take 2 tablets (1,600 mg total) by mouth 2 (two) times daily.   levothyroxine 25 MCG tablet Commonly known as: SYNTHROID TAKE 1 TABLET (25 MCG TOTAL) BY  MOUTH DAILY.   multivitamin with minerals tablet Take 1 tablet by mouth daily.   nabumetone 500 MG tablet Commonly known as: RELAFEN Take 2 tablets (1,000 mg total) by mouth 2 (two) times daily. For muscle and joint pain, especially at the right hip   pantoprazole 40 MG tablet Commonly known as: PROTONIX Take 1 tablet (40 mg total) by mouth daily.   Spacer/Aero-Holding [employer] 1 Device by Does not apply route as needed.   tamsulosin 0.4 MG Caps capsule Commonly known as: FLOMAX Take 2 capsules (0.8 mg total) by mouth at bedtime.   triamcinolone ointment 0.5 % Commonly known as: KENALOG APPLY TOPICALLY TWICE DAILY AS NEEDED   Xiidra 5 % Soln Generic drug: Lifitegrast       History (reviewed): Past Medical History:  Diagnosis Date  . Actinic keratitis 2017  . Anxiety   . Arthritis   . BPH (benign prostatic hyperplasia)   . Cataract   . GERD (gastroesophageal reflux disease)   . Hemorrhoids   . HOH (hard of hearing)   . Hx of colonic polyps   . Hyperlipidemia   . Hypothyroidism   . Keratosis, seborrheic 2017  . Pelvic fracture (HCC)   . Thyroid disease    Past Surgical History:  Procedure Laterality Date  . ANKLE SURGERY    . BACK SURGERY    . CARPAL TUNNEL RELEASE Right    x2  . CATARACT EXTRACTION W/PHACO Right 05/18/2016   Procedure: CATARACT EXTRACTION PHACO AND INTRAOCULAR LENS PLACEMENT RIGHT EYE; CDE:  5.35;  Surgeon: Gemma Payor, MD;  Location: [facility];  Service: Ophthalmology;  Laterality: Right;  . ROTATOR CUFF REPAIR    . SHOULDER SURGERY Right    x 2   . WRIST SURGERY Left    fracture   Family History  Problem Relation Age of Onset  . Colon cancer Mother   . Colon polyps Mother        ?  . Diabetes Daughter   . Heart disease Father        No details  . Emphysema Father        black lung / coal miner  . Heart disease Brother        Died during bypass age about 41.  . Diabetes Son   . Emphysema Son        black lung  . Heart  disease Brother        Unclear age of onset.  CAD.     Social History   Socioeconomic History  . Marital status: Divorced    Spouse name: Not on file  . Number of children: 4  . Years of education: 8  . Highest education level: 8th grade  Occupational History  . Occupation: Forensic scientist    Comment: Retired  Tobacco Use  . Smoking status: Never Smoker  . Smokeless tobacco: Never Used  Substance and Sexual Activity  . Alcohol use: No  . Drug use: No  . Sexual activity: Never    Birth control/protection: None  Other Topics Concern  .  Not on file  Social History Narrative   Caffeine  Daily    Divorced, lives with son. They have one dog. Pt drinks one cup of coffee a day, 1-2 decaff sodas a week. Was previously getting regular exercise walking and riding stationary bicycle, however has had pain in his right groin area that has prevented him from this the last 2 months. I questioned if anyone had examined the groin area, he states his PCP did and said it was arthritis.   Social Determinants of Health   Financial Resource Strain: Low Risk   . Difficulty of Paying Living Expenses: Not hard at all  Food Insecurity: No Food Insecurity  . Worried About Charity fundraiser in the Last Year: Never true  . Ran Out of Food in the Last Year: Never true  Transportation Needs: No Transportation Needs  . Lack of Transportation (Medical): No  . Lack of Transportation (Non-Medical): No  Physical Activity: Sufficiently Active  . Days of Exercise per Week: 7 days  . Minutes of Exercise per Session: 30 min  Stress: No Stress Concern Present  . Feeling of Stress : Not at all  Social Connections: Moderately Isolated  . Frequency of Communication with Friends and Family: More than three times a week  . Frequency of Social Gatherings with Friends and Family: More than three times a week  . Attends Religious Services: Never  . Active Member of Clubs or Organizations: No  . Attends Theatre manager Meetings: Never  . Marital Status: Divorced    Activities of Daily Living In your present state of health, do you have any difficulty performing the following activities: 02/02/2020  Hearing? Y  Vision? Y  Difficulty concentrating or making decisions? N  Walking or climbing stairs? Y  Dressing or bathing? N  Doing errands, shopping? N  Preparing Food and eating ? N  Using the Toilet? N  In the past six months, have you accidently leaked urine? N  Do you have problems with loss of bowel control? N  Managing your Medications? N  Managing your Finances? N  Housekeeping or managing your Housekeeping? N  Some recent data might be hidden    Patient Education/ Literacy How often do you need to have someone help you when you read instructions, pamphlets, or other written materials from your doctor or pharmacy?: 3 - Sometimes What is the last grade level you completed in school?: 8th Grade  Exercise Current Exercise Habits: Home exercise routine, Type of exercise: walking, Time (Minutes): 30, Frequency (Times/Week): 7, Weekly Exercise (Minutes/Week): 210, Intensity: Mild, Exercise limited by: None identified  Diet Patient reports consuming 2 meals a day and 3 snack(s) a day Patient reports that his primary diet is: Regular Patient reports that she does have regular access to food.   Depression Screen PHQ 2/9 Scores 02/02/2020 01/15/2020 02/14/2019 12/16/2018 11/08/2018 08/31/2018 05/06/2018  PHQ - 2 Score 0 0 0 0 0 0 0  PHQ- 9 Score - - - - - - -     Fall Risk Fall Risk  02/02/2020 01/15/2020 02/14/2019 12/16/2018 11/08/2018  Falls in the past year? 0 0 1 1 0  Comment - - - - -  Number falls in past yr: 0 0 0 0 -  Injury with Fall? 0 0 0 - -  Comment - - - - -  Risk for fall due to : Impaired mobility Impaired balance/gait - - -  Follow up Falls evaluation completed - - - -  Objective:  Michael Mcclure seemed alert and oriented and he participated appropriately during our  telephone visit.  Blood Pressure Weight BMI  BP Readings from Last 3 Encounters:  01/15/20 121/67  02/14/19 119/63  12/16/18 119/62   Wt Readings from Last 3 Encounters:  02/02/20 193 lb 9 oz (87.8 kg)  01/15/20 193 lb 9.6 oz (87.8 kg)  02/14/19 194 lb 2 oz (88.1 kg)   BMI Readings from Last 1 Encounters:  02/02/20 27.00 kg/m    *Unable to obtain current vital signs, weight, and BMI due to telephone visit type  Hearing/Vision  . Makarios did  seem to have difficulty with hearing/understanding during the telephone conversation . Reports that he has had a formal eye exam by an eye care professional within the past year . Reports that he has had a formal hearing evaluation within the past year *Unable to fully assess hearing and vision during telephone visit type  Cognitive Function: 6CIT Screen 02/02/2020  What Year? 0 points  What month? 0 points  What time? 0 points  Count back from 20 0 points  Months in reverse 0 points  Repeat phrase 0 points  Total Score 0   (Normal:0-7, Significant for Dysfunction: >8)  Normal Cognitive Function Screening: Yes   Immunization & Health Maintenance Record Immunization History  Administered Date(s) Administered  . Influenza Split 08/07/2010  . Influenza, High Dose Seasonal PF 10/02/2016, 10/08/2017, 08/31/2018, 08/28/2019, 08/28/2019  . Influenza,inj,Quad PF,6+ Mos 09/05/2015  . Pneumococcal Conjugate-13 09/05/2015  . Pneumococcal Polysaccharide-23 01/24/2018  . Tdap 11/26/2015  . Zoster 11/26/2015    Health Maintenance  Topic Date Due  . DEXA SCAN  01/19/2019  . TETANUS/TDAP  11/25/2025  . INFLUENZA VACCINE  Completed  . PNA vac Low Risk Adult  Completed       Assessment  This is a routine wellness examination for [employer].  Health Maintenance: Due or Overdue Health Maintenance Due  Topic Date Due  . DEXA SCAN  01/19/2019    Michael Mcclure does not need a referral for Community Assistance: Care  Management:   no Social Work:    no Prescription Assistance:  no Nutrition/Diabetes Education:  no   Plan:  Personalized Goals Goals Addressed   None    Personalized Health Maintenance & Screening Recommendations  Bone densitometry screening Advanced directives: has NO advanced directive  - add't info requested. Referral to SW: no  Lung Cancer Screening Recommended: no (Low Dose CT Chest recommended if Age 109-80 years, 30 pack-year currently smoking OR have quit w/in past 15 years) Hepatitis C Screening recommended: no HIV Screening recommended: no  Advanced Directives: Written information was prepared per patient's request.  Referrals & Orders No orders of the defined types were placed in this encounter.   Follow-up Plan . Follow-up with Mechele Claude, MD as planned   I have personally reviewed and noted the following in the patient's chart:   . Medical and social history . Use of alcohol, tobacco or illicit drugs  . Current medications and supplements . Functional ability and status . Nutritional status . Physical activity . Advanced directives . List of other physicians . Hospitalizations, surgeries, and ER visits in previous 12 months . Vitals . Screenings to include cognitive, depression, and falls . Referrals and appointments  In addition, I have reviewed and discussed with Michael Mcclure certain preventive protocols, quality metrics, and best practice recommendations. A written personalized care plan for preventive services as well as general preventive  health recommendations is available and can be mailed to the patient at his request.      Caryl Bis, LPN  01/26/2541

## 2020-02-02 NOTE — Patient Instructions (Signed)
Michael Mcclure , Thank you for taking time to come for your Medicare Wellness Visit. I appreciate your ongoing commitment to your health goals. Please review the following plan we discussed and let me know if I can assist you in the future.   These are the goals we discussed: Goals    . Exercise 150 min/wk Moderate Activity       This is a list of the screening recommended for you and due dates:  Health Maintenance  Topic Date Due  . DEXA scan (bone density measurement)  01/19/2019  . Tetanus Vaccine  11/25/2025  . Flu Shot  Completed  . Pneumonia vaccines  Completed     Advance Directive  Advance directives are legal documents that let you make choices ahead of time about your health care and medical treatment in case you become unable to communicate for yourself. Advance directives are a way for you to make known your wishes to family, friends, and health care providers. This can let others know about your end-of-life care if you become unable to communicate. Discussing and writing advance directives should happen over time rather than all at once. Advance directives can be changed depending on your situation and what you want, even after you have signed the advance directives. There are different types of advance directives, such as:  Medical power of attorney.  Living will.  Do not resuscitate (DNR) or do not attempt resuscitation (DNAR) order. Health care proxy and medical power of attorney A health care proxy is also called a health care agent. This is a person who is appointed to make medical decisions for you in cases where you are unable to make the decisions yourself. Generally, people choose someone they know well and trust to represent their preferences. Make sure to ask this person for an agreement to act as your proxy. A proxy may have to exercise judgment in the event of a medical decision for which your wishes are not known. A medical power of attorney is a legal  document that names your health care proxy. Depending on the laws in your state, after the document is written, it may also need to be:  Signed.  Notarized.  Dated.  Copied.  Witnessed.  Incorporated into your medical record. You may also want to appoint someone to manage your money in a situation in which you are unable to do so. This is called a durable power of attorney for finances. It is a separate legal document from the durable power of attorney for health care. You may choose the same person or someone different from your health care proxy to act as your agent in money matters. If you do not appoint a proxy, or if there is a concern that the proxy is not acting in your best interests, a court may appoint a guardian to act on your behalf. Living will A living will is a set of instructions that state your wishes about medical care when you cannot express them yourself. Health care providers should keep a copy of your living will in your medical record. You may want to give a copy to family members or friends. To alert caregivers in case of an emergency, you can place a card in your wallet to let them know that you have a living will and where they can find it. A living will is used if you become:  Terminally ill.  Disabled.  Unable to communicate or make decisions. Items to consider in your living  will include:  To use or not to use life-support equipment, such as dialysis machines and breathing machines (ventilators).  A DNR or DNAR order. This tells health care providers not to use cardiopulmonary resuscitation (CPR) if breathing or heartbeat stops.  To use or not to use tube feeding.  To be given or not to be given food and fluids.  Comfort (palliative) care when the goal becomes comfort rather than a cure.  Donation of organs and tissues. A living will does not give instructions for distributing your money and property if you should pass away. DNR or DNAR A DNR or DNAR  order is a request not to have CPR in the event that your heart stops beating or you stop breathing. If a DNR or DNAR order has not been made and shared, a health care provider will try to help any patient whose heart has stopped or who has stopped breathing. If you plan to have surgery, talk with your health care provider about how your DNR or DNAR order will be followed if problems occur. What if I do not have an advance directive? If you do not have an advance directive, some states assign family decision makers to act on your behalf based on how closely you are related to them. Each state has its own laws about advance directives. You may want to check with your health care provider, attorney, or state representative about the laws in your state. Summary  Advance directives are the legal documents that allow you to make choices ahead of time about your health care and medical treatment in case you become unable to tell others about your care.  The process of discussing and writing advance directives should happen over time. You can change the advance directives, even after you have signed them.  Advance directives include DNR or DNAR orders, living wills, and designating an agent as your medical power of attorney. This information is not intended to replace advice given to you by your health care provider. Make sure you discuss any questions you have with your health care provider. Document Revised: 06/22/2019 Document Reviewed: 06/22/2019 Elsevier Patient Education  2020 [employer].

## 2020-04-17 ENCOUNTER — Other Ambulatory Visit: Payer: Self-pay

## 2020-04-17 ENCOUNTER — Ambulatory Visit (INDEPENDENT_AMBULATORY_CARE_PROVIDER_SITE_OTHER): Payer: No Typology Code available for payment source

## 2020-04-17 ENCOUNTER — Encounter: Payer: Self-pay | Admitting: Family Medicine

## 2020-04-17 ENCOUNTER — Ambulatory Visit (INDEPENDENT_AMBULATORY_CARE_PROVIDER_SITE_OTHER): Payer: No Typology Code available for payment source | Admitting: Family Medicine

## 2020-04-17 VITALS — BP 130/61 | HR 77 | Temp 98.0°F | Resp 20 | Ht 71.0 in | Wt 188.0 lb

## 2020-04-17 DIAGNOSIS — M25551 Pain in right hip: Secondary | ICD-10-CM | POA: Diagnosis not present

## 2020-04-17 DIAGNOSIS — W19XXXA Unspecified fall, initial encounter: Secondary | ICD-10-CM

## 2020-04-17 DIAGNOSIS — M25531 Pain in right wrist: Secondary | ICD-10-CM | POA: Diagnosis not present

## 2020-04-17 DIAGNOSIS — M25562 Pain in left knee: Secondary | ICD-10-CM

## 2020-04-17 DIAGNOSIS — M1990 Unspecified osteoarthritis, unspecified site: Secondary | ICD-10-CM | POA: Diagnosis not present

## 2020-04-17 MED ORDER — PREDNISONE 10 MG PO TABS: ORAL_TABLET | ORAL | 0 refills | Status: DC

## 2020-04-17 NOTE — Progress Notes (Signed)
Subjective:  Patient ID: Michael Mcclure, male    DOB: 1932/08/16  Age: 86 y.o. MRN: [id number]  CC: No chief complaint on file.   HPI Michael Mcclure presents for patient fell in his bathtub a few weeks ago.  He continues to have right hip pain and lumbar pain.  He describes this is a burning when he tries to walk.  The intensity is moderate it increases the more he moves around.  Patient also has been having right wrist pain since his fall.  It is mild but constant.  And it increases the more he uses it.  He points to the dorsal wrist at the carpal to radius and ulna joints.  The left popliteal area is in pain.  He reaches behind it and says that it hurts worse when he stretches it out.  Specifically if he sits still for a while and then stretches the knee out by extension that he will have pain that is moderate.  He points to the hamstrings.  When he flexes the knee the hamstrings hurt again.  Depression screen [facility] 2/9 04/17/2020 02/02/2020 01/15/2020  Decreased Interest 0 0 0  Down, Depressed, Hopeless 0 0 0  PHQ - 2 Score 0 0 0  Altered sleeping - - -  Tired, decreased energy - - -  Change in appetite - - -  Feeling bad or failure about yourself  - - -  Trouble concentrating - - -  Moving slowly or fidgety/restless - - -  Suicidal thoughts - - -  PHQ-9 Score - - -  Some recent data might be hidden    History Michael Mcclure has a past medical history of Actinic keratitis (2017), Anxiety, Arthritis, BPH (benign prostatic hyperplasia), Cataract, GERD (gastroesophageal reflux disease), Hemorrhoids, HOH (hard of hearing), colonic polyps, Hyperlipidemia, Hypothyroidism, Keratosis, seborrheic (2017), Pelvic fracture ([place]), and Thyroid disease.   He has a past surgical history that includes Back surgery; Wrist surgery (Left); Shoulder surgery (Right); Rotator cuff repair; Carpal tunnel release (Right); Cataract extraction w/PHACO (Right, 05/18/2016); and Ankle surgery.   His family history includes  Colon cancer in his mother; Colon polyps in his mother; Diabetes in his daughter and son; Emphysema in his father and son; Heart disease in his brother, brother, and father.He reports that he has never smoked. He has never used smokeless tobacco. He reports that he does not drink alcohol or use drugs.    ROS Review of Systems  Constitutional: Negative for fever.  Respiratory: Negative for shortness of breath.   Cardiovascular: Negative for chest pain.  Musculoskeletal: Positive for arthralgias.  Skin: Negative for rash.    Objective:  BP 130/61   Pulse 77   Temp 98 F (36.7 C) (Temporal)   Resp 20   Ht 5\' 11"  (1.803 m)   Wt 188 lb (85.3 kg)   SpO2 99%   BMI 26.22 kg/m   BP Readings from Last 3 Encounters:  04/17/20 130/61  01/15/20 121/67  02/14/19 119/63    Wt Readings from Last 3 Encounters:  04/17/20 188 lb (85.3 kg)  02/02/20 193 lb 9 oz (87.8 kg)  01/15/20 193 lb 9.6 oz (87.8 kg)     Physical Exam Vitals reviewed.  Constitutional:      Appearance: He is well-developed.  HENT:     Head: Normocephalic and atraumatic.     Right Ear: External ear normal.     Left Ear: External ear normal.     Mouth/Throat:  Pharynx: No oropharyngeal exudate or posterior oropharyngeal erythema.  Eyes:     Pupils: Pupils are equal, round, and reactive to light.  Cardiovascular:     Rate and Rhythm: Normal rate and regular rhythm.     Heart sounds: No murmur.  Pulmonary:     Effort: No respiratory distress.     Breath sounds: Normal breath sounds.  Musculoskeletal:        General: Tenderness (  For all range of motion of the right wrist flexion and rotation..  For palpation of the greater trochanteric region of the right hip.  For palpation of the medial and lateral hamstring tendons on the left) present.     Cervical back: Normal range of motion and neck supple.  Neurological:     Mental Status: He is alert and oriented to person, place, and time.    X-rays show rather  severe arthritis in each of the examined areas.    Assessment & Plan:   Diagnoses and all orders for this visit:  Fall, initial encounter -     DG HIP UNILAT W OR W/O PELVIS 2-3 VIEWS RIGHT; Future -     DG Knee 1-2 Views Left; Future -     DG Wrist Complete Right; Future -     predniSONE (DELTASONE) 10 MG tablet; Take 5 daily for 3 days followed by 4,3,2 and 1 for 3 days each. -     Shower chair  Right hip pain -     DG HIP UNILAT W OR W/O PELVIS 2-3 VIEWS RIGHT; Future -     predniSONE (DELTASONE) 10 MG tablet; Take 5 daily for 3 days followed by 4,3,2 and 1 for 3 days each. -     Shower chair  Right wrist pain -     DG Wrist Complete Right; Future -     predniSONE (DELTASONE) 10 MG tablet; Take 5 daily for 3 days followed by 4,3,2 and 1 for 3 days each. -     Shower chair  Acute pain of left knee -     DG Knee 1-2 Views Left; Future -     predniSONE (DELTASONE) 10 MG tablet; Take 5 daily for 3 days followed by 4,3,2 and 1 for 3 days each. -     Shower chair  Arthritis -     predniSONE (DELTASONE) 10 MG tablet; Take 5 daily for 3 days followed by 4,3,2 and 1 for 3 days each. -     Shower chair       I am having Michael Mcclure. Michael Mcclure start on predniSONE. I am also having him maintain his cholecalciferol, Flax Seed Oil, multivitamin with minerals, Xiidra, Spacer/Aero-Holding Chambers, triamcinolone ointment, albuterol, tamsulosin, alendronate, citalopram, clopidogrel, diclofenac, ezetimibe-simvastatin, gabapentin, levothyroxine, nabumetone, and pantoprazole.  Allergies as of 04/17/2020      Reactions   Hydrocodone Shortness Of Breath   Ciprofloxacin Other (See Comments)   Question caused short of breath.      Medication List       Accurate as of Apr 17, 2020  5:53 PM. If you have any questions, ask your nurse or doctor.        albuterol 108 (90 Base) MCG/ACT inhaler Commonly known as: VENTOLIN HFA TAKE 2 PUFFS BY MOUTH EVERY 6 HOURS AS NEEDED FOR WHEEZE OR SHORTNESS  OF BREATH   alendronate 70 MG tablet Commonly known as: FOSAMAX TAKE 1 TABLET BY MOUTH ONCE A WEEK WITH A FULL GLASS OF WATER ON AN EMPTY  STOMACH.   cholecalciferol 1000 units tablet Commonly known as: VITAMIN D Take 1,000 Units by mouth daily.   citalopram 20 MG tablet Commonly known as: CELEXA Take 1 tablet (20 mg total) by mouth daily.   clopidogrel 75 MG tablet Commonly known as: PLAVIX Take 1 tablet (75 mg total) by mouth daily.   diclofenac 1.3 % Ptch Commonly known as: Flector Place 1 patch onto the skin 2 (two) times daily.   ezetimibe-simvastatin 10-40 MG tablet Commonly known as: VYTORIN Take 1 tablet by mouth daily at 6 PM.   Flax Seed Oil 1000 MG Caps Take 1 capsule by mouth daily.   gabapentin 800 MG tablet Commonly known as: NEURONTIN Take 2 tablets (1,600 mg total) by mouth 2 (two) times daily.   levothyroxine 25 MCG tablet Commonly known as: SYNTHROID TAKE 1 TABLET (25 MCG TOTAL) BY MOUTH DAILY.   multivitamin with minerals tablet Take 1 tablet by mouth daily.   nabumetone 500 MG tablet Commonly known as: RELAFEN Take 2 tablets (1,000 mg total) by mouth 2 (two) times daily. For muscle and joint pain, especially at the right hip   pantoprazole 40 MG tablet Commonly known as: PROTONIX Take 1 tablet (40 mg total) by mouth daily.   predniSONE 10 MG tablet Commonly known as: DELTASONE Take 5 daily for 3 days followed by 4,3,2 and 1 for 3 days each. Started by: Mechele Claude, MD   Spacer/Aero-Holding [facility] 1 Device by Does not apply route as needed.   tamsulosin 0.4 MG Caps capsule Commonly known as: FLOMAX Take 2 capsules (0.8 mg total) by mouth at bedtime.   triamcinolone ointment 0.5 % Commonly known as: KENALOG APPLY TOPICALLY TWICE DAILY AS NEEDED   Xiidra 5 % Soln Generic drug: Lifitegrast        Follow-up: No follow-ups on file.  Mechele Claude, M.D.

## 2020-07-11 ENCOUNTER — Other Ambulatory Visit: Payer: Self-pay | Admitting: Family Medicine

## 2020-07-11 DIAGNOSIS — N400 Enlarged prostate without lower urinary tract symptoms: Secondary | ICD-10-CM

## 2020-07-15 ENCOUNTER — Encounter: Payer: Self-pay | Admitting: Family Medicine

## 2020-07-15 ENCOUNTER — Ambulatory Visit: Payer: Medicare Other | Admitting: Family Medicine

## 2020-08-09 ENCOUNTER — Ambulatory Visit: Payer: No Typology Code available for payment source | Admitting: Family Medicine

## 2020-08-09 ENCOUNTER — Other Ambulatory Visit: Payer: Self-pay | Admitting: Family Medicine

## 2020-08-09 ENCOUNTER — Encounter: Payer: Self-pay | Admitting: Family Medicine

## 2020-08-09 ENCOUNTER — Other Ambulatory Visit: Payer: Self-pay

## 2020-08-09 VITALS — BP 113/62 | HR 74 | Temp 98.1°F | Ht 71.0 in | Wt 187.6 lb

## 2020-08-09 DIAGNOSIS — M1990 Unspecified osteoarthritis, unspecified site: Secondary | ICD-10-CM | POA: Diagnosis not present

## 2020-08-09 DIAGNOSIS — M67432 Ganglion, left wrist: Secondary | ICD-10-CM

## 2020-08-09 DIAGNOSIS — E785 Hyperlipidemia, unspecified: Secondary | ICD-10-CM | POA: Diagnosis not present

## 2020-08-09 DIAGNOSIS — K219 Gastro-esophageal reflux disease without esophagitis: Secondary | ICD-10-CM | POA: Diagnosis not present

## 2020-08-09 DIAGNOSIS — N400 Enlarged prostate without lower urinary tract symptoms: Secondary | ICD-10-CM | POA: Diagnosis not present

## 2020-08-09 DIAGNOSIS — E039 Hypothyroidism, unspecified: Secondary | ICD-10-CM

## 2020-08-10 LAB — CBC WITH DIFFERENTIAL/PLATELET
Basophils Absolute: 0 10*3/uL (ref 0.0–0.2)
Basos: 1 %
EOS (ABSOLUTE): 0.2 10*3/uL (ref 0.0–0.4)
Eos: 4 %
Hematocrit: 38.5 % (ref 37.5–51.0)
Hemoglobin: 13.1 g/dL (ref 13.0–17.7)
Immature Grans (Abs): 0 10*3/uL (ref 0.0–0.1)
Immature Granulocytes: 0 %
Lymphocytes Absolute: 1.2 10*3/uL (ref 0.7–3.1)
Lymphs: 28 %
MCH: 31.9 pg (ref 26.6–33.0)
MCHC: 34 g/dL (ref 31.5–35.7)
MCV: 94 fL (ref 79–97)
Monocytes Absolute: 0.4 10*3/uL (ref 0.1–0.9)
Monocytes: 9 %
Neutrophils Absolute: 2.4 10*3/uL (ref 1.4–7.0)
Neutrophils: 58 %
Platelets: 144 10*3/uL — ABNORMAL LOW (ref 150–450)
RBC: 4.11 x10E6/uL — ABNORMAL LOW (ref 4.14–5.80)
RDW: 12.4 % (ref 11.6–15.4)
WBC: 4.2 10*3/uL (ref 3.4–10.8)

## 2020-08-10 LAB — CMP14+EGFR
ALT: 15 IU/L (ref 0–44)
AST: 28 IU/L (ref 0–40)
Albumin/Globulin Ratio: 2 (ref 1.2–2.2)
Albumin: 4.3 g/dL (ref 3.6–4.6)
Alkaline Phosphatase: 60 IU/L (ref 48–121)
BUN/Creatinine Ratio: 15 (ref 10–24)
BUN: 19 mg/dL (ref 8–27)
Bilirubin Total: 0.5 mg/dL (ref 0.0–1.2)
CO2: 23 mmol/L (ref 20–29)
Calcium: 9.7 mg/dL (ref 8.6–10.2)
Chloride: 101 mmol/L (ref 96–106)
Creatinine, Ser: 1.31 mg/dL — ABNORMAL HIGH (ref 0.76–1.27)
GFR calc Af Amer: 57 mL/min/{1.73_m2} — ABNORMAL LOW (ref >59)
GFR calc non Af Amer: 49 mL/min/{1.73_m2} — ABNORMAL LOW (ref >59)
Globulin, Total: 2.2 g/dL (ref 1.5–4.5)
Glucose: 75 mg/dL (ref 65–99)
Potassium: 4.7 mmol/L (ref 3.5–5.2)
Sodium: 136 mmol/L (ref 134–144)
Total Protein: 6.5 g/dL (ref 6.0–8.5)

## 2020-08-10 LAB — TSH+FREE T4
Free T4: 1.55 ng/dL (ref 0.82–1.77)
TSH: 4.12 u[IU]/mL (ref 0.450–4.500)

## 2020-08-10 LAB — LIPID PANEL
Chol/HDL Ratio: 2.7 ratio (ref 0.0–5.0)
Cholesterol, Total: 160 mg/dL (ref 100–199)
HDL: 59 mg/dL (ref >39)
LDL Chol Calc (NIH): 85 mg/dL (ref 0–99)
Triglycerides: 83 mg/dL (ref 0–149)
VLDL Cholesterol Cal: 16 mg/dL (ref 5–40)

## 2020-08-11 NOTE — Progress Notes (Signed)
Hello Dorr,  Your lab result is normal and/or stable.Some minor variations that are not significant are commonly marked abnormal, but do not represent any medical problem for you.  Best regards, Myrl Bynum, M.D.

## 2020-08-12 ENCOUNTER — Encounter: Payer: Self-pay | Admitting: Family Medicine

## 2020-08-12 NOTE — Progress Notes (Signed)
Subjective:  Patient ID: Michael Mcclure, male    DOB: 25-Feb-1932  Age: 86 y.o. MRN: [id number]  CC: Follow-up (6 month)   HPI Michael Mcclure presents for pain behind the left knee.  He also has a ganglion of the left wrist on the side of the anatomic snuffbox.  He would like to have this removed and requests referral.  Patient presents for follow-up on  thyroid. The patient has a history of hypothyroidism for many years. It has been stable recently. Pt. denies any change in  voice, loss of hair, heat or cold intolerance. Energy level has been adequate to good. Patient denies constipation and diarrhea. No myxedema. Medication is as noted below. Verified that pt is taking it daily on an empty stomach. Well tolerated. Patient in for follow-up of GERD. Currently asymptomatic taking  PPI daily. There is no chest pain or heartburn. No hematemesis and no melena. No dysphagia or choking. Onset is remote. Progression is stable. Complicating factors, none.   in for follow-up of elevated cholesterol. Doing well without complaints on current medication. Denies side effects of statin including myalgia and arthralgia and nausea. Currently no chest pain, shortness of breath or other cardiovascular related symptoms noted.    Depression screen [facility] 2/9 08/09/2020 04/17/2020 02/02/2020  Decreased Interest 0 0 0  Down, Depressed, Hopeless 0 0 0  PHQ - 2 Score 0 0 0  Altered sleeping - - -  Tired, decreased energy - - -  Change in appetite - - -  Feeling bad or failure about yourself  - - -  Trouble concentrating - - -  Moving slowly or fidgety/restless - - -  Suicidal thoughts - - -  PHQ-9 Score - - -  Some recent data might be hidden    History Michael Mcclure has a past medical history of Actinic keratitis (2017), Anxiety, Arthritis, BPH (benign prostatic hyperplasia), Cataract, GERD (gastroesophageal reflux disease), Hemorrhoids, HOH (hard of hearing), colonic polyps, Hyperlipidemia, Hypothyroidism,  Keratosis, seborrheic (2017), Pelvic fracture ([place]), and Thyroid disease.   He has a past surgical history that includes Back surgery; Wrist surgery (Left); Shoulder surgery (Right); Rotator cuff repair; Carpal tunnel release (Right); Cataract extraction w/PHACO (Right, 05/18/2016); and Ankle surgery.   His family history includes Colon cancer in his mother; Colon polyps in his mother; Diabetes in his daughter and son; Emphysema in his father and son; Heart disease in his brother, brother, and father.He reports that he has never smoked. He has never used smokeless tobacco. He reports that he does not drink alcohol and does not use drugs.    ROS Review of Systems  Constitutional: Negative.   HENT: Negative.   Eyes: Negative for visual disturbance.  Respiratory: Negative for cough and shortness of breath.   Cardiovascular: Negative for chest pain and leg swelling.  Gastrointestinal: Negative for abdominal pain, diarrhea, nausea and vomiting.  Genitourinary: Negative for difficulty urinating.  Musculoskeletal: Negative for arthralgias and myalgias.  Skin: Negative for rash.  Neurological: Negative for headaches.  Psychiatric/Behavioral: Negative for sleep disturbance.    Objective:  BP 113/62   Pulse 74   Temp 98.1 F (36.7 C) (Temporal)   Ht _0  (1.803 m)   Wt 187 lb 9.6 oz (85.1 kg)   BMI 26.16 kg/m   BP Readings from Last 3 Encounters:  08/09/20 113/62  04/17/20 130/61  01/15/20 121/67    Wt Readings from Last 3 Encounters:  08/09/20 187 lb 9.6 oz (85.1 kg)  04/17/20 188  lb (85.3 kg)  02/02/20 193 lb 9 oz (87.8 kg)     Physical Exam Vitals reviewed.  Constitutional:      Appearance: He is well-developed.  HENT:     Head: Normocephalic and atraumatic.     Right Ear: Tympanic membrane and external ear normal. No decreased hearing noted.     Left Ear: Tympanic membrane and external ear normal. No decreased hearing noted.     Mouth/Throat:     Pharynx: No  oropharyngeal exudate or posterior oropharyngeal erythema.  Eyes:     Pupils: Pupils are equal, round, and reactive to light.  Cardiovascular:     Rate and Rhythm: Normal rate and regular rhythm.     Heart sounds: No murmur heard.   Pulmonary:     Effort: No respiratory distress.     Breath sounds: Normal breath sounds.  Abdominal:     General: Bowel sounds are normal.     Palpations: Abdomen is soft. There is no mass.     Tenderness: There is no abdominal tenderness.  Musculoskeletal:     Cervical back: Normal range of motion and neck supple.       Assessment & Plan:   Michael Mcclure was seen today for follow-up.  Diagnoses and all orders for this visit:  Arthritis -     CBC with Differential/Platelet -     CMP14+EGFR  Gastroesophageal reflux disease, unspecified whether esophagitis present -     CBC with Differential/Platelet -     CMP14+EGFR  Hyperlipidemia, unspecified hyperlipidemia type -     CBC with Differential/Platelet -     CMP14+EGFR -     Lipid panel  Benign prostatic hyperplasia, unspecified whether lower urinary tract symptoms present -     CBC with Differential/Platelet -     CMP14+EGFR  Acquired hypothyroidism -     CBC with Differential/Platelet -     CMP14+EGFR -     TSH + free T4  Ganglion, left wrist -     Ambulatory referral to Hand Surgery       I am having Michael Mcclure maintain his cholecalciferol, Flax Seed Oil, multivitamin with minerals, Xiidra, Spacer/Aero-Holding Chambers, albuterol, alendronate, citalopram, clopidogrel, diclofenac, ezetimibe-simvastatin, gabapentin, levothyroxine, nabumetone, pantoprazole, predniSONE, tamsulosin, and triamcinolone ointment.  Allergies as of 08/09/2020      Reactions   Hydrocodone Shortness Of Breath   Ciprofloxacin Other (See Comments)   Question caused short of breath.      Medication List       Accurate as of August 09, 2020 11:59 PM. If you have any questions, ask your nurse or doctor.          albuterol 108 (90 Base) MCG/ACT inhaler Commonly known as: VENTOLIN HFA TAKE 2 PUFFS BY MOUTH EVERY 6 HOURS AS NEEDED FOR WHEEZE OR SHORTNESS OF BREATH   alendronate 70 MG tablet Commonly known as: FOSAMAX TAKE 1 TABLET BY MOUTH ONCE A WEEK WITH A FULL GLASS OF WATER ON AN EMPTY STOMACH.   cholecalciferol 1000 units tablet Commonly known as: VITAMIN D Take 1,000 Units by mouth daily.   citalopram 20 MG tablet Commonly known as: CELEXA Take 1 tablet (20 mg total) by mouth daily.   clopidogrel 75 MG tablet Commonly known as: PLAVIX Take 1 tablet (75 mg total) by mouth daily.   diclofenac 1.3 % Ptch Commonly known as: Flector Place 1 patch onto the skin 2 (two) times daily.   ezetimibe-simvastatin 10-40 MG tablet Commonly known as: VYTORIN Take  1 tablet by mouth daily at 6 PM.   Flax Seed Oil 1000 MG Caps Take 1 capsule by mouth daily.   gabapentin 800 MG tablet Commonly known as: NEURONTIN Take 2 tablets (1,600 mg total) by mouth 2 (two) times daily.   levothyroxine 25 MCG tablet Commonly known as: SYNTHROID TAKE 1 TABLET (25 MCG TOTAL) BY MOUTH DAILY.   multivitamin with minerals tablet Take 1 tablet by mouth daily.   nabumetone 500 MG tablet Commonly known as: RELAFEN Take 2 tablets (1,000 mg total) by mouth 2 (two) times daily. For muscle and joint pain, especially at the right hip   pantoprazole 40 MG tablet Commonly known as: PROTONIX Take 1 tablet (40 mg total) by mouth daily.   predniSONE 10 MG tablet Commonly known as: DELTASONE Take 5 daily for 3 days followed by 4,3,2 and 1 for 3 days each.   Spacer/Aero-Holding [employer] 1 Device by Does not apply route as needed.   tamsulosin 0.4 MG Caps capsule Commonly known as: FLOMAX TAKE 2 CAPSULES (0.8 MG TOTAL) BY MOUTH AT BEDTIME.   triamcinolone ointment 0.5 % Commonly known as: KENALOG APPLY TOPICALLY TWICE DAILY AS NEEDED   Xiidra 5 % Soln Generic drug: Lifitegrast         Follow-up: Return in about 6 months (around 02/06/2021).  Claretta Fraise, M.D.

## 2020-10-14 ENCOUNTER — Other Ambulatory Visit: Payer: Self-pay | Admitting: Family Medicine

## 2020-10-14 DIAGNOSIS — N400 Enlarged prostate without lower urinary tract symptoms: Secondary | ICD-10-CM

## 2020-10-15 ENCOUNTER — Other Ambulatory Visit: Payer: Self-pay | Admitting: Family Medicine

## 2020-10-16 ENCOUNTER — Other Ambulatory Visit: Payer: Self-pay | Admitting: Family Medicine

## 2020-10-16 DIAGNOSIS — K219 Gastro-esophageal reflux disease without esophagitis: Secondary | ICD-10-CM

## 2020-11-06 ENCOUNTER — Other Ambulatory Visit: Payer: Self-pay

## 2020-11-06 ENCOUNTER — Ambulatory Visit: Payer: Medicare Other | Admitting: Family Medicine

## 2020-11-08 ENCOUNTER — Ambulatory Visit (INDEPENDENT_AMBULATORY_CARE_PROVIDER_SITE_OTHER): Payer: No Typology Code available for payment source | Admitting: Family Medicine

## 2020-11-08 DIAGNOSIS — K5909 Other constipation: Secondary | ICD-10-CM | POA: Diagnosis not present

## 2020-11-08 DIAGNOSIS — Z1211 Encounter for screening for malignant neoplasm of colon: Secondary | ICD-10-CM | POA: Diagnosis not present

## 2020-11-08 NOTE — Progress Notes (Signed)
Subjective:    Patient ID: Michael Mcclure, male    DOB: 1932/07/20, 86 y.o.   MRN: [id number]   Michael Mcclure is a 86 y.o. male presenting for  Two months of constipation. BMs occurring about every third day. Concerned about the possibility of cancer. Denies abd pain. Eating normally - normal appetite. No weight loss.  He is using fiber to help with constipation.    Depression screen [facility] 2/9 08/09/2020 04/17/2020 02/02/2020 01/15/2020 02/14/2019  Decreased Interest 0 0 0 0 0  Down, Depressed, Hopeless 0 0 0 0 0  PHQ - 2 Score 0 0 0 0 0  Altered sleeping - - - - -  Tired, decreased energy - - - - -  Change in appetite - - - - -  Feeling bad or failure about yourself  - - - - -  Trouble concentrating - - - - -  Moving slowly or fidgety/restless - - - - -  Suicidal thoughts - - - - -  PHQ-9 Score - - - - -  Some recent data might be hidden     Relevant past medical, surgical, family and social history reviewed and updated as indicated.  Interim medical history since our last visit reviewed. Allergies and medications reviewed and updated.  ROS:  Review of Systems  Constitutional: Negative for fever.  Respiratory: Negative for shortness of breath.   Cardiovascular: Negative for chest pain.  Gastrointestinal: Positive for constipation.  Musculoskeletal: Negative for arthralgias.  Skin: Negative for rash.     Social History   Tobacco Use  Smoking Status Never Smoker  Smokeless Tobacco Never Used       Objective:     Wt Readings from Last 3 Encounters:  08/09/20 187 lb 9.6 oz (85.1 kg)  04/17/20 188 lb (85.3 kg)  02/02/20 193 lb 9 oz (87.8 kg)     Exam deferred. Pt. Harboring due to COVID 19. Phone visit performed.   Assessment & Plan:   1. Other constipation   2. Screen for colon cancer     No orders of the defined types were placed in this encounter.   Orders Placed This Encounter  Procedures  . Cologuard      Diagnoses and all orders for this  visit:  Other constipation  Screen for colon cancer -     Cologuard   I reviewed over-the-counter medications including Colace which I recommended he take twice daily every day.  He can supplement that with Metamucil a tablespoon twice a day and he can use MiraLAX if his bowel movements go 3 days. Virtual Visit via telephone Note  I discussed the limitations, risks, security and privacy concerns of performing an evaluation and management service by telephone and the availability of in person appointments. The patient was identified with two identifiers. Pt.expressed understanding and agreed to proceed. Pt. Is at home. Dr. Darlyn Read is in his office.  Follow Up Instructions:   I discussed the assessment and treatment plan with the patient. The patient was provided an opportunity to ask questions and all were answered. The patient agreed with the plan and demonstrated an understanding of the instructions.   The patient was advised to call back or seek an in-person evaluation if the symptoms worsen or if the condition fails to improve as anticipated.   Total minutes including chart review and phone contact time: 18   Follow up plan: No follow-ups on file.  Mechele Claude, MD [facility]

## 2020-11-11 ENCOUNTER — Encounter: Payer: Self-pay | Admitting: Family Medicine

## 2020-11-19 ENCOUNTER — Other Ambulatory Visit: Payer: Self-pay | Admitting: Family Medicine

## 2020-11-19 DIAGNOSIS — F419 Anxiety disorder, unspecified: Secondary | ICD-10-CM

## 2020-12-11 ENCOUNTER — Ambulatory Visit: Payer: No Typology Code available for payment source | Admitting: Family Medicine

## 2020-12-12 ENCOUNTER — Ambulatory Visit (INDEPENDENT_AMBULATORY_CARE_PROVIDER_SITE_OTHER): Payer: No Typology Code available for payment source

## 2020-12-12 ENCOUNTER — Encounter: Payer: Self-pay | Admitting: Nurse Practitioner

## 2020-12-12 ENCOUNTER — Other Ambulatory Visit: Payer: Self-pay

## 2020-12-12 ENCOUNTER — Ambulatory Visit (INDEPENDENT_AMBULATORY_CARE_PROVIDER_SITE_OTHER): Payer: No Typology Code available for payment source | Admitting: Nurse Practitioner

## 2020-12-12 VITALS — BP 118/60 | HR 82 | Temp 98.0°F | Resp 20 | Ht 71.0 in | Wt 179.0 lb

## 2020-12-12 DIAGNOSIS — M25512 Pain in left shoulder: Secondary | ICD-10-CM

## 2020-12-12 MED ORDER — PREDNISONE 20 MG PO TABS
ORAL_TABLET | ORAL | 0 refills | Status: DC
Start: 2020-12-12 — End: 2021-01-23

## 2020-12-12 NOTE — Progress Notes (Signed)
   Subjective:    Patient ID: Michael Mcclure, male    DOB: 1932/06/18, 87 y.o.   MRN: [id number]   Chief Complaint: Shoulder Pain (Left   No injury. Going on for 2 months and he can't sleep/)   HPI Patient come sin today C/o left shoulder pain. Said he woke up 1 morning 2 months ago and it was hurting and has been hurting every since. He has tried heat which has not helped. He rates pain 10/10. Moving in certain directions increases pain. OTC creams for pain help some.   Review of Systems  Musculoskeletal: Positive for arthralgias (left shoulder).  All other systems reviewed and are negative.      Objective:   Physical Exam Vitals and nursing note reviewed.  Constitutional:      Appearance: Normal appearance.  Cardiovascular:     Rate and Rhythm: Normal rate and regular rhythm.     Heart sounds: Normal heart sounds.  Pulmonary:     Breath sounds: Normal breath sounds.  Musculoskeletal:     Comments: FROM of left shoulder wit pain on abduction and internal rotation. Grips equal bil  Skin:    General: Skin is warm.  Neurological:     General: No focal deficit present.     Mental Status: He is alert.    BP 118/60   Pulse 82   Temp 98 F (36.7 C) (Temporal)   Resp 20   Ht 5\' 11"  (1.803 m)   Wt 179 lb (81.2 kg)   SpO2 96%   BMI 24.97 kg/m   Shoulder xray appears normal-Preliminary reading by , FNP  [facility]       Assessment & Plan:  [facility] in today with chief complaint of Shoulder Pain (Left   No injury. Going on for 2 months and he can't sleep/)   1. Acute pain of left shoulder Moist heat  Rest Order PT - DG Shoulder Left - predniSONE (DELTASONE) 20 MG tablet; 2 po at sametime daily for 5 days  Dispense: 10 tablet; Refill: 0    The above assessment and management plan was discussed with the patient. The patient verbalized understanding of and has agreed to the management plan. Patient is aware to call the clinic if symptoms persist or  worsen. Patient is aware when to return to the clinic for a follow-up visit. Patient educated on when it is appropriate to go to the emergency department.   Mary-Margaret Lytle [place]te, FNP

## 2020-12-12 NOTE — Patient Instructions (Signed)
Shoulder Pain Many things can cause shoulder pain, including:  An injury to the shoulder.  Overuse of the shoulder.  Arthritis. The source of the pain can be:  Inflammation.  An injury to the shoulder joint.  An injury to a tendon, ligament, or bone. Follow these instructions at home: Pay attention to changes in your symptoms. Let your health care provider know about them. Follow these instructions to relieve your pain. If you have a sling:  Wear the sling as told by your health care provider. Remove it only as told by your health care provider.  Loosen the sling if your fingers tingle, become numb, or turn cold and blue.  Keep the sling clean.  If the sling is not waterproof: ? Do not let it get wet. Remove it to shower or bathe.  Move your arm as little as possible, but keep your hand moving to prevent swelling. Managing pain, stiffness, and swelling   If directed, put ice on the painful area: ? Put ice in a plastic bag. ? Place a towel between your skin and the bag. ? Leave the ice on for 20 minutes, 2-3 times per day. Stop applying ice if it does not help with the pain.  Squeeze a soft ball or a foam pad as much as possible. This helps to keep the shoulder from swelling. It also helps to strengthen the arm. General instructions  Take over-the-counter and prescription medicines only as told by your health care provider.  Keep all follow-up visits as told by your health care provider. This is important. Contact a health care provider if:  Your pain gets worse.  Your pain is not relieved with medicines.  New pain develops in your arm, hand, or fingers. Get help right away if:  Your arm, hand, or fingers: ? Tingle. ? Become numb. ? Become swollen. ? Become painful. ? Turn white or blue. Summary  Shoulder pain can be caused by an injury, overuse, or arthritis.  Pay attention to changes in your symptoms. Let your health care provider know about  them.  This condition may be treated with a sling, ice, and pain medicines.  Contact your health care provider if the pain gets worse or new pain develops. Get help right away if your arm, hand, or fingers tingle or become numb, swollen, or painful.  Keep all follow-up visits as told by your health care provider. This is important. This information is not intended to replace advice given to you by your health care provider. Make sure you discuss any questions you have with your health care provider. Document Revised: 06/07/2018 Document Reviewed: 06/07/2018 Elsevier Patient Education  2020 [employer]  

## 2020-12-17 ENCOUNTER — Ambulatory Visit: Payer: Medicare Other | Attending: Nurse Practitioner | Admitting: Physical Therapy

## 2020-12-17 ENCOUNTER — Other Ambulatory Visit: Payer: Self-pay

## 2020-12-17 DIAGNOSIS — M25512 Pain in left shoulder: Secondary | ICD-10-CM | POA: Insufficient documentation

## 2020-12-17 DIAGNOSIS — M542 Cervicalgia: Secondary | ICD-10-CM | POA: Insufficient documentation

## 2020-12-17 NOTE — Therapy (Signed)
[facility] [address] [place], [place], [address] Phone: [phone]   Fax:  [phone]  Physical Therapy Evaluation  Patient Details  Name: Michael Mcclure MRN: [id number] Date of Birth: 1931-12-30 Referring Provider (PT): Paulene Floor.   Encounter Date: 12/17/2020   PT End of Session - 12/17/20 1105    Visit Number 1    Number of Visits 12    Date for PT Re-Evaluation 03/17/21    PT Start Time 1031    PT Stop Time 1122    PT Time Calculation (min) 51 min    Activity Tolerance Patient tolerated treatment well    Behavior During Therapy WFL for tasks assessed/performed           Past Medical History:  Diagnosis Date  . Actinic keratitis 2017  . Anxiety   . Arthritis   . BPH (benign prostatic hyperplasia)   . Cataract   . GERD (gastroesophageal reflux disease)   . Hemorrhoids   . HOH (hard of hearing)   . Hx of colonic polyps   . Hyperlipidemia   . Hypothyroidism   . Keratosis, seborrheic 2017  . Pelvic fracture (HCC)   . Thyroid disease     Past Surgical History:  Procedure Laterality Date  . ANKLE SURGERY    . BACK SURGERY    . CARPAL TUNNEL RELEASE Right    x2  . CATARACT EXTRACTION W/PHACO Right 05/18/2016   Procedure: CATARACT EXTRACTION PHACO AND INTRAOCULAR LENS PLACEMENT RIGHT EYE; CDE:  5.35;  Surgeon: Gemma Payor, MD;  Location: [facility];  Service: Ophthalmology;  Laterality: Right;  . ROTATOR CUFF REPAIR    . SHOULDER SURGERY Right    x 2   . WRIST SURGERY Left    fracture    There were no vitals filed for this visit.    Subjective Assessment - 12/17/20 1058    Subjective COVID-19 screen performed prior to patient entering clinic. The patient presents to the clinic with c/o left shoulder pain.  Prior to his PCP visit he had an Urgent Care visit on 11/22/20 with c/o left sided neck pain.  He reports neck pain today on the left with radiation of pain into his left shoulder region and into his middle  deltoid and Tricep region.  This has been ongoing for a couple of months and he is unable to sleep comfortably, especially on his left side.  He reports hispain is severe.    Pertinent History left wrist surgery; HOH; back suregry; CTR (right); left wrist and right shoulder surgery.    Patient Stated Goals Get out of pain and sleep better.    Currently in Pain? Yes    Pain Score 10-Worst pain ever    Pain Location Shoulder   Neck.   Pain Orientation Left    Pain Descriptors / Indicators Aching;Sore    Pain Type Acute pain    Pain Onset More than a month ago    Pain Frequency Constant    Aggravating Factors  Sleeping.              [facility] PT Assessment - 12/17/20 0001      Assessment   Medical Diagnosis Acute left shoulder pain.    Referring Provider (PT) Paulene Floor.    Onset Date/Surgical Date --   ~2 months.     Precautions   Precautions None      Restrictions   Weight Bearing Restrictions No      Balance Screen  Has the patient fallen in the past 6 months Yes    Has the patient had a decrease in activity level because of a fear of falling?  No    Is the patient reluctant to leave their home because of a fear of falling?  No      Home Environment   Living Environment Private residence      Observation/Other Assessments   Observations Noticed ecchymosis in right Bicep region.  He was lifting a case of water.  He has a "popeye" muscle.      Posture/Postural Control   Posture/Postural Control Postural limitations    Postural Limitations Rounded Shoulders;Forward head      Deep Tendon Reflexes   DTR Assessment Site Biceps;Brachioradialis;Triceps    Biceps DTR 1+    Brachioradialis DTR 1+    Triceps DTR 1+      ROM / Strength   AROM / PROM / Strength AROM;Strength      AROM   Overall AROM Comments Bilateral active cervical rotation.  Full active left shoulder AROM.      Strength   Overall Strength Comments Normal left shoulder strength.      Palpation    Palpation comment Tender to palpation left mid-cervical region, left levator scapulae and UT.  Referred pain reported into left middle deltoid region and Tricep region      Special Tests   Other special tests (-) left shoulder Impingement testing.      Ambulation/Gait   Gait Comments Walks with a cane.                      Objective measurements completed on examination: See above findings.       [facility] Adult PT Treatment/Exercise - 12/17/20 0001      Modalities   Modalities Electrical Stimulation;Moist Heat      Moist Heat Therapy   Number Minutes Moist Heat 20 Minutes      Electrical Stimulation   Electrical Stimulation Location Left cervical/left shoulder.    Electrical Stimulation Action IFC    Electrical Stimulation Parameters 80-150 Hz on 40% scan x 20 minutes.    Electrical Stimulation Goals Pain;Tone                  PT Education - 12/17/20 1106    Education Details Chin tucks and pain-free cervical extension.    Person(s) Educated Patient    Methods Explanation    Comprehension Verbalized understanding;Returned demonstration               PT Long Term Goals - 12/17/20 1156      PT LONG TERM GOAL #1   Title Pt will be independent in his HEP and progression.    Time 6    Period Weeks    Status New      PT LONG TERM GOAL #2   Title Eliminate left UE symptoms.    Time 6    Period Weeks    Status New      PT LONG TERM GOAL #3   Title Sleep 6 hours undisturbed.    Time 6    Period Weeks    Status New      PT LONG TERM GOAL #4   Title Perform ADL's with pain not > 2-3/10.    Time 6    Period Weeks    Status New  Plan - 12/17/20 1114    Clinical Impression Statement The patient presents to [facility] with c/o left sided neck pain and lert shoulder pain.  He is limited with regards to cervical movement but his left shoulder range of motion and strength is WNL.  He c/o left mid-cervical pain and radiation of  pain.  He demonstrates negative left shoulder Impingement.  He has difficulty sleeping due to pain.COVID-19 screen performed prior to patient entering clinic.    Personal Factors and Comorbidities Comorbidity 1;Comorbidity 2    Comorbidities Left wrist surgery; HOH; back suregry; CTR (right); left wrist and right shoulder surgery.    Examination-Activity Limitations Sleep;Other    Examination-Participation Restrictions Other    Stability/Clinical Decision Making Evolving/Moderate complexity    Clinical Decision Making Low    Rehab Potential Good    PT Frequency --   2-3 times a week for 4-6 weeks.   PT Treatment/Interventions ADLs/Self Care Home Management;Cryotherapy;Electrical Stimulation;Moist Heat;Traction;Ultrasound;Therapeutic exercise;Therapeutic activities;Patient/family education;Manual techniques;Passive range of motion    PT Next Visit Plan UBE, chin tucks and extension, RW4 (left shoulder), Combo e'stim/US to left C-spine/UT region, manual traction and then try intermittment mechanical tarction beginning at 13# if patient does well with manual traction.    Consulted and Agree with Plan of Care Patient           Patient will benefit from skilled therapeutic intervention in order to improve the following deficits and impairments:  Pain,Decreased activity tolerance,Decreased range of motion,Postural dysfunction  Visit Diagnosis: Acute pain of left shoulder - Plan: PT plan of care cert/re-cert  Cervicalgia - Plan: PT plan of care cert/re-cert     Problem List Patient Active Problem List   Diagnosis Date Noted  . Impairment of balance 08/03/2019  . Ganglion cyst of wrist, left 01/24/2019  . Osteoporosis 01/19/2017  . Osteoarthritis of hip 11/02/2016  . CKD (chronic kidney disease) stage 3, GFR 30-59 ml/min (HCC) 10/02/2016  . Venous insufficiency 05/11/2016  . Dizziness 02/07/2016  . Sensorineural hearing loss (SNHL) of both ears 10/10/2015  . Hypothyroidism 09/20/2014   . Neuropathy 02/14/2014  . Lumbar scoliosis 02/14/2014  . Anterolisthesis 02/14/2014  . GERD (gastroesophageal reflux disease) 03/19/2011  . Hyperlipidemia 03/19/2011  . Neurogenic bladder 03/19/2011  . BPH (benign prostatic hyperplasia) 03/19/2011  . Arthritis 12/06/2008  . Back pain 12/06/2008  . CARPAL TUNNEL SYNDROME, HX OF 12/06/2008    Staci Carver, [name] MPT 12/17/2020, 12:03 PM  [facility] [address] [place], [place], [address] Phone: [phone]   Fax:  [phone]  Name: Michael Mcclure MRN: [id number] Date of Birth: Apr 27, 1932

## 2020-12-19 ENCOUNTER — Other Ambulatory Visit: Payer: Self-pay

## 2020-12-19 ENCOUNTER — Ambulatory Visit: Payer: Medicare Other | Admitting: *Deleted

## 2020-12-19 DIAGNOSIS — M25512 Pain in left shoulder: Secondary | ICD-10-CM

## 2020-12-19 DIAGNOSIS — M542 Cervicalgia: Secondary | ICD-10-CM

## 2020-12-19 NOTE — Therapy (Signed)
[facility] [address] [place], [place], [address] Phone: [phone]   Fax:  [phone]  Physical Therapy Treatment  Patient Details  Name: Michael Mcclure MRN: [id number] Date of Birth: 08/14/1932 Referring Provider (PT): Paulene Floor.   Encounter Date: 12/19/2020   PT End of Session - 12/19/20 1405    Visit Number 2    Number of Visits 12    Date for PT Re-Evaluation 03/17/21    PT Start Time 1030    PT Stop Time 1120    PT Time Calculation (min) 50 min           Past Medical History:  Diagnosis Date  . Actinic keratitis 2017  . Anxiety   . Arthritis   . BPH (benign prostatic hyperplasia)   . Cataract   . GERD (gastroesophageal reflux disease)   . Hemorrhoids   . HOH (hard of hearing)   . Hx of colonic polyps   . Hyperlipidemia   . Hypothyroidism   . Keratosis, seborrheic 2017  . Pelvic fracture (HCC)   . Thyroid disease     Past Surgical History:  Procedure Laterality Date  . ANKLE SURGERY    . BACK SURGERY    . CARPAL TUNNEL RELEASE Right    x2  . CATARACT EXTRACTION W/PHACO Right 05/18/2016   Procedure: CATARACT EXTRACTION PHACO AND INTRAOCULAR LENS PLACEMENT RIGHT EYE; CDE:  5.35;  Surgeon: Gemma Payor, MD;  Location: [facility];  Service: Ophthalmology;  Laterality: Right;  . ROTATOR CUFF REPAIR    . SHOULDER SURGERY Right    x 2   . WRIST SURGERY Left    fracture    There were no vitals filed for this visit.   Subjective Assessment - 12/19/20 1036    Subjective COVID-19 screen performed prior to patient entering clinic. 7-8/10    Pertinent History left wrist surgery; HOH; back suregry; CTR (right); left wrist and right shoulder surgery.    Patient Stated Goals Get out of pain and sleep better.    Currently in Pain? Yes    Pain Score 8                              [facility] Adult PT Treatment/Exercise - 12/19/20 0001      Modalities   Modalities Electrical Stimulation;Moist  Heat;Ultrasound      Moist Heat Therapy   Number Minutes Moist Heat 15 Minutes    Moist Heat Location Cervical      Electrical Stimulation   Electrical Stimulation Location Left cervical/left shoulder.    Electrical Stimulation Action IFC    Electrical Stimulation Parameters 80-150hz  x 15 mins    Electrical Stimulation Goals Pain;Tone      Ultrasound   Ultrasound Location LT cerv paras/UT    Ultrasound Parameters 1.5 w/cm2 x 12 mins    Ultrasound Goals Pain      Manual Therapy   Manual Therapy Soft tissue mobilization    Soft tissue mobilization STW/ TPR  to LT cerv paras and Utrap f/b manual  cerv. traction.                       PT Long Term Goals - 12/17/20 1156      PT LONG TERM GOAL #1   Title Pt will be independent in his HEP and progression.    Time 6    Period Weeks    Status  New      PT LONG TERM GOAL #2   Title Eliminate left UE symptoms.    Time 6    Period Weeks    Status New      PT LONG TERM GOAL #3   Title Sleep 6 hours undisturbed.    Time 6    Period Weeks    Status New      PT LONG TERM GOAL #4   Title Perform ADL's with pain not > 2-3/10.    Time 6    Period Weeks    Status New                 Plan - 12/19/20 1127    Clinical Impression Statement Pt arrived today doing fair. He c/o LT sided neck and shldr pain. US/combo was performed f/b STW and manual traction. He had notable mm tightness/ TPs with some release.    Personal Factors and Comorbidities Comorbidity 1;Comorbidity 2    Comorbidities Left wrist surgery; HOH; back suregry; CTR (right); left wrist and right shoulder surgery.    Stability/Clinical Decision Making Evolving/Moderate complexity    PT Treatment/Interventions ADLs/Self Care Home Management;Cryotherapy;Electrical Stimulation;Moist Heat;Traction;Ultrasound;Therapeutic exercise;Therapeutic activities;Patient/family education;Manual techniques;Passive range of motion    PT Next Visit Plan UBE, chin tucks  and extension, RW4 (left shoulder), Combo e'stim/US to left C-spine/UT region, manual traction and then try intermittment mechanical tarction beginning at 13# if patient does well with manual traction.    Consulted and Agree with Plan of Care Patient           Patient will benefit from skilled therapeutic intervention in order to improve the following deficits and impairments:  Pain,Decreased activity tolerance,Decreased range of motion,Postural dysfunction  Visit Diagnosis: Acute pain of left shoulder  Cervicalgia     Problem List Patient Active Problem List   Diagnosis Date Noted  . Impairment of balance 08/03/2019  . Ganglion cyst of wrist, left 01/24/2019  . Osteoporosis 01/19/2017  . Osteoarthritis of hip 11/02/2016  . CKD (chronic kidney disease) stage 3, GFR 30-59 ml/min (HCC) 10/02/2016  . Venous insufficiency 05/11/2016  . Dizziness 02/07/2016  . Sensorineural hearing loss (SNHL) of both ears 10/10/2015  . Hypothyroidism 09/20/2014  . Neuropathy 02/14/2014  . Lumbar scoliosis 02/14/2014  . Anterolisthesis 02/14/2014  . GERD (gastroesophageal reflux disease) 03/19/2011  . Hyperlipidemia 03/19/2011  . Neurogenic bladder 03/19/2011  . BPH (benign prostatic hyperplasia) 03/19/2011  . Arthritis 12/06/2008  . Back pain 12/06/2008  . CARPAL TUNNEL SYNDROME, HX OF 12/06/2008    RAMSEUR,CHRIS, PTA 12/19/2020, 2:15 PM  [facility] [address] [place], [place], [address] Phone: [phone]   Fax:  [phone]  Name: Michael Mcclure MRN: [id number] Date of Birth: [birth year]

## 2020-12-24 ENCOUNTER — Other Ambulatory Visit: Payer: Self-pay

## 2020-12-24 ENCOUNTER — Ambulatory Visit: Payer: Medicare Other | Admitting: Physical Therapy

## 2020-12-24 DIAGNOSIS — M25512 Pain in left shoulder: Secondary | ICD-10-CM

## 2020-12-24 DIAGNOSIS — M542 Cervicalgia: Secondary | ICD-10-CM

## 2020-12-24 NOTE — Therapy (Signed)
[facility] [address] [place], [place], [address] Phone: [phone]   Fax:  [phone]  Physical Therapy Treatment  Patient Details  Name: Michael Mcclure MRN: [id number] Date of Birth: 08/01/1932 Referring Provider (PT): Paulene Floor.   Encounter Date: 12/24/2020   PT End of Session - 12/24/20 1323    Visit Number 3    Number of Visits 12    Date for PT Re-Evaluation 03/17/21    PT Start Time 1115    PT Stop Time 1207    PT Time Calculation (min) 52 min    Activity Tolerance Patient tolerated treatment well    Behavior During Therapy [facility] for tasks assessed/performed           Past Medical History:  Diagnosis Date  . Actinic keratitis 2017  . Anxiety   . Arthritis   . BPH (benign prostatic hyperplasia)   . Cataract   . GERD (gastroesophageal reflux disease)   . Hemorrhoids   . HOH (hard of hearing)   . Hx of colonic polyps   . Hyperlipidemia   . Hypothyroidism   . Keratosis, seborrheic 2017  . Pelvic fracture (HCC)   . Thyroid disease     Past Surgical History:  Procedure Laterality Date  . ANKLE SURGERY    . BACK SURGERY    . CARPAL TUNNEL RELEASE Right    x2  . CATARACT EXTRACTION W/PHACO Right 05/18/2016   Procedure: CATARACT EXTRACTION PHACO AND INTRAOCULAR LENS PLACEMENT RIGHT EYE; CDE:  5.35;  Surgeon: Gemma Payor, MD;  Location: [facility];  Service: Ophthalmology;  Laterality: Right;  . ROTATOR CUFF REPAIR    . SHOULDER SURGERY Right    x 2   . WRIST SURGERY Left    fracture    There were no vitals filed for this visit.   Subjective Assessment - 12/24/20 1326    Subjective COVID-19 screen performed prior to patient entering clinic.  A lot of pain over the weekend.    Pertinent History left wrist surgery; HOH; back suregry; CTR (right); left wrist and right shoulder surgery.    Patient Stated Goals Get out of pain and sleep better.    Currently in Pain? Yes    Pain Location Shoulder    Pain  Orientation Left    Pain Onset More than a month ago                             [facility] Adult PT Treatment/Exercise - 12/24/20 0001      Modalities   Modalities Electrical Stimulation;Moist Heat      Moist Heat Therapy   Number Minutes Moist Heat 20 Minutes    Moist Heat Location --   Left cervical region.     Programme researcher, broadcasting/film/video Location LT cervical/Left shoulder.    Electrical Stimulation Action IFC    Electrical Stimulation Parameters 80-150 Hz at 40% scan x 20 minutes.    Electrical Stimulation Goals Pain;Tone      Ultrasound   Ultrasound Location Left shoulder/left UT.    Ultrasound Parameters Combo e'stim/US at 1.50 SW/Cm2 x 12 minutes.    Ultrasound Goals Pain      Manual Therapy   Manual Therapy Soft tissue mobilization    Soft tissue mobilization STW/M x 11 minutes including IASTM to patient's left UT, Levator scapulae  PT Long Term Goals - 12/17/20 1156      PT LONG TERM GOAL #1   Title Pt will be independent in his HEP and progression.    Time 6    Period Weeks    Status New      PT LONG TERM GOAL #2   Title Eliminate left UE symptoms.    Time 6    Period Weeks    Status New      PT LONG TERM GOAL #3   Title Sleep 6 hours undisturbed.    Time 6    Period Weeks    Status New      PT LONG TERM GOAL #4   Title Perform ADL's with pain not > 2-3/10.    Time 6    Period Weeks    Status New                 Plan - 12/24/20 1341    Clinical Impression Statement Patient was in a lot of pain over the weekend.  Continued c/o left sided neck pain and radiation into left UE to about the level of the elbow today.    Personal Factors and Comorbidities Comorbidity 1;Comorbidity 2    Comorbidities Left wrist surgery; HOH; back suregry; CTR (right); left wrist and right shoulder surgery.    Examination-Activity Limitations Sleep;Other    Examination-Participation Restrictions  Other    Stability/Clinical Decision Making Evolving/Moderate complexity    Rehab Potential Good    PT Treatment/Interventions ADLs/Self Care Home Management;Cryotherapy;Electrical Stimulation;Moist Heat;Traction;Ultrasound;Therapeutic exercise;Therapeutic activities;Patient/family education;Manual techniques;Passive range of motion    PT Next Visit Plan UBE, chin tucks and extension, RW4 (left shoulder), Combo e'stim/US to left C-spine/UT region, manual traction and then try intermittment mechanical tarction beginning at 13# if patient does well with manual traction.    Consulted and Agree with Plan of Care Patient           Patient will benefit from skilled therapeutic intervention in order to improve the following deficits and impairments:  Pain,Decreased activity tolerance,Decreased range of motion,Postural dysfunction  Visit Diagnosis: Acute pain of left shoulder  Cervicalgia     Problem List Patient Active Problem List   Diagnosis Date Noted  . Impairment of balance 08/03/2019  . Ganglion cyst of wrist, left 01/24/2019  . Osteoporosis 01/19/2017  . Osteoarthritis of hip 11/02/2016  . CKD (chronic kidney disease) stage 3, GFR 30-59 ml/min (HCC) 10/02/2016  . Venous insufficiency 05/11/2016  . Dizziness 02/07/2016  . Sensorineural hearing loss (SNHL) of both ears 10/10/2015  . Hypothyroidism 09/20/2014  . Neuropathy 02/14/2014  . Lumbar scoliosis 02/14/2014  . Anterolisthesis 02/14/2014  . GERD (gastroesophageal reflux disease) 03/19/2011  . Hyperlipidemia 03/19/2011  . Neurogenic bladder 03/19/2011  . BPH (benign prostatic hyperplasia) 03/19/2011  . Arthritis 12/06/2008  . Back pain 12/06/2008  . CARPAL TUNNEL SYNDROME, HX OF 12/06/2008    Michael Mcclure, [name] MPT 12/24/2020, 1:43 PM  [facility] [address] [place], [place], [address] Phone: [phone]   Fax:  [phone]  Name: Michael Mcclure MRN: [id number] Date  of Birth: 1932-08-01

## 2020-12-26 ENCOUNTER — Ambulatory Visit: Payer: Medicare Other | Admitting: Physical Therapy

## 2020-12-26 ENCOUNTER — Other Ambulatory Visit: Payer: Self-pay

## 2020-12-26 DIAGNOSIS — M542 Cervicalgia: Secondary | ICD-10-CM

## 2020-12-26 DIAGNOSIS — M25512 Pain in left shoulder: Secondary | ICD-10-CM

## 2020-12-26 NOTE — Therapy (Signed)
[facility] [address] [place], [place], [address] Phone: [phone]   Fax:  [phone]  Physical Therapy Treatment  Patient Details  Name: Michael Mcclure MRN: [id number] Date of Birth: Feb 13, 1932 Referring Provider (PT): Paulene Floor.   Encounter Date: 12/26/2020   PT End of Session - 12/26/20 1110    Visit Number 4    Number of Visits 12    Date for PT Re-Evaluation 03/17/21    PT Start Time 1025    PT Stop Time 1117    PT Time Calculation (min) 52 min           Past Medical History:  Diagnosis Date  . Actinic keratitis 2017  . Anxiety   . Arthritis   . BPH (benign prostatic hyperplasia)   . Cataract   . GERD (gastroesophageal reflux disease)   . Hemorrhoids   . HOH (hard of hearing)   . Hx of colonic polyps   . Hyperlipidemia   . Hypothyroidism   . Keratosis, seborrheic 2017  . Pelvic fracture (HCC)   . Thyroid disease     Past Surgical History:  Procedure Laterality Date  . ANKLE SURGERY    . BACK SURGERY    . CARPAL TUNNEL RELEASE Right    x2  . CATARACT EXTRACTION W/PHACO Right 05/18/2016   Procedure: CATARACT EXTRACTION PHACO AND INTRAOCULAR LENS PLACEMENT RIGHT EYE; CDE:  5.35;  Surgeon: Gemma Payor, MD;  Location: [facility];  Service: Ophthalmology;  Laterality: Right;  . ROTATOR CUFF REPAIR    . SHOULDER SURGERY Right    x 2   . WRIST SURGERY Left    fracture    There were no vitals filed for this visit.   Subjective Assessment - 12/26/20 1109    Subjective COVID-19 screen performed prior to patient entering clinic.  Last treatment helped a lot.  I slept better.    Pertinent History left wrist surgery; HOH; back surgery; CTR (right); left wrist and right shoulder surgery.    Patient Stated Goals Get out of pain and sleep better.    Currently in Pain? Yes    Pain Score 5                              [facility] Adult PT Treatment/Exercise - 12/26/20 0001      Modalities    Modalities Electrical Stimulation;Moist Heat;Ultrasound      Moist Heat Therapy   Number Minutes Moist Heat 20 Minutes    Moist Heat Location --   Left cervical region.     Programme researcher, broadcasting/film/video Location Left cervical/left shoulder.    Electrical Stimulation Action IFC    Electrical Stimulation Parameters 80-150 Hz x 20 minutes on 40% scan    Electrical Stimulation Goals Pain;Tone      Ultrasound   Ultrasound Location Left shoulder/left cervical/UT    Ultrasound Parameters Combo e'stim/US at 1.50 W/CM2 x 12 minutes.    Ultrasound Goals Pain      Manual Therapy   Manual Therapy Soft tissue mobilization    Soft tissue mobilization STW/M x 11 minutes to patient's left cervical region/UT and middle deltoid region to reduce tone and pain.                       PT Long Term Goals - 12/17/20 1156      PT LONG TERM GOAL #1  Title Pt will be independent in his HEP and progression.    Time 6    Period Weeks    Status New      PT LONG TERM GOAL #2   Title Eliminate left UE symptoms.    Time 6    Period Weeks    Status New      PT LONG TERM GOAL #3   Title Sleep 6 hours undisturbed.    Time 6    Period Weeks    Status New      PT LONG TERM GOAL #4   Title Perform ADL's with pain not > 2-3/10.    Time 6    Period Weeks    Status New                 Plan - 12/26/20 1128    Clinical Impression Statement Patient felt much better after last treatment and reported a reduction in pain and slept well.  he tolerated treatmennt well today and felt better following.    Personal Factors and Comorbidities Comorbidity 1;Comorbidity 2    Comorbidities Left wrist surgery; HOH; back suregry; CTR (right); left wrist and right shoulder surgery.    Examination-Activity Limitations Sleep;Other    Examination-Participation Restrictions Other    Stability/Clinical Decision Making Evolving/Moderate complexity    Rehab Potential Good    PT  Treatment/Interventions ADLs/Self Care Home Management;Cryotherapy;Electrical Stimulation;Moist Heat;Traction;Ultrasound;Therapeutic exercise;Therapeutic activities;Patient/family education;Manual techniques;Passive range of motion    PT Next Visit Plan UBE, chin tucks and extension, RW4 (left shoulder), Combo e'stim/US to left C-spine/UT region, manual traction and then try intermittment mechanical tarction beginning at 13# if patient does well with manual traction.    Consulted and Agree with Plan of Care Patient           Patient will benefit from skilled therapeutic intervention in order to improve the following deficits and impairments:  Pain,Decreased activity tolerance,Decreased range of motion,Postural dysfunction  Visit Diagnosis: Acute pain of left shoulder  Cervicalgia     Problem List Patient Active Problem List   Diagnosis Date Noted  . Impairment of balance 08/03/2019  . Ganglion cyst of wrist, left 01/24/2019  . Osteoporosis 01/19/2017  . Osteoarthritis of hip 11/02/2016  . CKD (chronic kidney disease) stage 3, GFR 30-59 ml/min (HCC) 10/02/2016  . Venous insufficiency 05/11/2016  . Dizziness 02/07/2016  . Sensorineural hearing loss (SNHL) of both ears 10/10/2015  . Hypothyroidism 09/20/2014  . Neuropathy 02/14/2014  . Lumbar scoliosis 02/14/2014  . Anterolisthesis 02/14/2014  . GERD (gastroesophageal reflux disease) 03/19/2011  . Hyperlipidemia 03/19/2011  . Neurogenic bladder 03/19/2011  . BPH (benign prostatic hyperplasia) 03/19/2011  . Arthritis 12/06/2008  . Back pain 12/06/2008  . CARPAL TUNNEL SYNDROME, HX OF 12/06/2008    Hether Anselmo, [name] MPT 12/26/2020, 11:31 AM  [facility] [address] [place], [place], [address] Phone: [phone]   Fax:  [phone]  Name: LEXINGTON KROTZ MRN: [id number] Date of Birth: 08-21-1932

## 2020-12-31 ENCOUNTER — Other Ambulatory Visit: Payer: Self-pay

## 2020-12-31 ENCOUNTER — Ambulatory Visit: Payer: Medicare Other | Admitting: Physical Therapy

## 2020-12-31 DIAGNOSIS — M25512 Pain in left shoulder: Secondary | ICD-10-CM

## 2020-12-31 DIAGNOSIS — M542 Cervicalgia: Secondary | ICD-10-CM

## 2020-12-31 NOTE — Therapy (Signed)
[facility] [address] [place], [place], [address] Phone: [phone]   Fax:  [phone]  Physical Therapy Treatment  Patient Details  Name: Michael Mcclure MRN: [id number] Date of Birth: 1932/03/04 Referring Provider (PT): Michael Mcclure.   Encounter Date: 12/31/2020   PT End of Session - 12/31/20 1138    Visit Number 5    Number of Visits 12    Date for PT Re-Evaluation 03/17/21    PT Start Time 1115    PT Stop Time 1210    PT Time Calculation (min) 55 min    Activity Tolerance Patient tolerated treatment well    Behavior During Therapy [facility] for tasks assessed/performed           Past Medical History:  Diagnosis Date  . Actinic keratitis 2017  . Anxiety   . Arthritis   . BPH (benign prostatic hyperplasia)   . Cataract   . GERD (gastroesophageal reflux disease)   . Hemorrhoids   . HOH (hard of hearing)   . Hx of colonic polyps   . Hyperlipidemia   . Hypothyroidism   . Keratosis, seborrheic 2017  . Pelvic fracture (HCC)   . Thyroid disease     Past Surgical History:  Procedure Laterality Date  . ANKLE SURGERY    . BACK SURGERY    . CARPAL TUNNEL RELEASE Right    x2  . CATARACT EXTRACTION W/PHACO Right 05/18/2016   Procedure: CATARACT EXTRACTION PHACO AND INTRAOCULAR LENS PLACEMENT RIGHT EYE; CDE:  5.35;  Surgeon: Michael Payor, MD;  Location: [facility];  Service: Ophthalmology;  Laterality: Right;  . ROTATOR CUFF REPAIR    . SHOULDER SURGERY Right    x 2   . WRIST SURGERY Left    fracture    There were no vitals filed for this visit.   Subjective Assessment - 12/31/20 1138    Subjective COVID-19 screen performed prior to patient entering clinic.  Good and bad days.    Pertinent History left wrist surgery; HOH; back surgery; CTR (right); left wrist and right shoulder surgery.    Currently in Pain? Yes    Pain Score 5     Pain Location Shoulder    Pain Orientation Left    Pain Descriptors / Indicators Aching;Sore     Pain Type Acute pain    Pain Onset More than a month ago                             [facility] Adult PT Treatment/Exercise - 12/31/20 0001      Modalities   Modalities Electrical Stimulation;Moist Heat;Traction;Ultrasound      Moist Heat Therapy   Number Minutes Moist Heat 20 Minutes    Moist Heat Location --   Left cervical.     Electrical Stimulation   Electrical Stimulation Location Left cervical/Left UT region.    Electrical Stimulation Action IFC    Electrical Stimulation Parameters 80-150 on 40% scan x 20 minutes.    Electrical Stimulation Goals Pain;Tone      Ultrasound   Ultrasound Location Left UT.    Ultrasound Parameters Combo e'stim/US at 1.50 W/CM2 x 8 minutes.    Ultrasound Goals Pain      Traction   Type of Traction Cervical    Min (lbs) 5    Max (lbs) 12    Hold Time 99    Rest Time 5    Time 15  PT Long Term Goals - 12/17/20 1156      PT LONG TERM GOAL #1   Title Pt will be independent in his HEP and progression.    Time 6    Period Weeks    Status New      PT LONG TERM GOAL #2   Title Eliminate left UE symptoms.    Time 6    Period Weeks    Status New      PT LONG TERM GOAL #3   Title Sleep 6 hours undisturbed.    Time 6    Period Weeks    Status New      PT LONG TERM GOAL #4   Title Perform ADL's with pain not > 2-3/10.    Time 6    Period Weeks    Status New                 Plan - 12/31/20 1217    Clinical Impression Statement Patient with continued left sided neck and shoulder pain.  He experiences intense sharp pain at times.  He did very well with low-level traction today and following treatment he stated his neck "hurts just a little."  We reviewed chin tucks and cervical extension.  We discussed dry needling and he was provided with a consent form.    Personal Factors and Comorbidities Comorbidity 1;Comorbidity 2    Comorbidities Left wrist surgery; HOH; back suregry;  CTR (right); left wrist and right shoulder surgery.    Examination-Activity Limitations Sleep;Other    Examination-Participation Restrictions Other    Stability/Clinical Decision Making Evolving/Moderate complexity    Rehab Potential Good    PT Treatment/Interventions ADLs/Self Care Home Management;Cryotherapy;Electrical Stimulation;Moist Heat;Traction;Ultrasound;Therapeutic exercise;Therapeutic activities;Patient/family education;Manual techniques;Passive range of motion    PT Next Visit Plan UBE, chin tucks and extension, RW4 (left shoulder), Combo e'stim/US to left C-spine/UT region, manual traction and then try intermittment mechanical tarction beginning at 13# if patient does well with manual traction.    Consulted and Agree with Plan of Care Patient           Patient will benefit from skilled therapeutic intervention in order to improve the following deficits and impairments:  Pain,Decreased activity tolerance,Decreased range of motion,Postural dysfunction  Visit Diagnosis: Acute pain of left shoulder - Plan: PT plan of care cert/re-cert  Cervicalgia - Plan: PT plan of care cert/re-cert     Problem List Patient Active Problem List   Diagnosis Date Noted  . Impairment of balance 08/03/2019  . Ganglion cyst of wrist, left 01/24/2019  . Osteoporosis 01/19/2017  . Osteoarthritis of hip 11/02/2016  . CKD (chronic kidney disease) stage 3, GFR 30-59 ml/min (HCC) 10/02/2016  . Venous insufficiency 05/11/2016  . Dizziness 02/07/2016  . Sensorineural hearing loss (SNHL) of both ears 10/10/2015  . Hypothyroidism 09/20/2014  . Neuropathy 02/14/2014  . Lumbar scoliosis 02/14/2014  . Anterolisthesis 02/14/2014  . GERD (gastroesophageal reflux disease) 03/19/2011  . Hyperlipidemia 03/19/2011  . Neurogenic bladder 03/19/2011  . BPH (benign prostatic hyperplasia) 03/19/2011  . Arthritis 12/06/2008  . Back pain 12/06/2008  . CARPAL TUNNEL SYNDROME, HX OF 12/06/2008    Michael Mcclure,  Michael Mcclure 12/31/2020, 12:21 PM  [facility] [address] [place], [place], [address] Phone: [phone]   Fax:  [phone]  Name: Michael Mcclure MRN: [id number] Date of Birth: [birth year]

## 2020-12-31 NOTE — Patient Instructions (Signed)
[facility]  DRY NEEDLING CONSENT FORM   Trigger point dry needling is a physical therapy approach to treat Myofascial Pain and Dysfunction.  Dry Needling (DN) is a valuable and effective way to deactivate myofascial trigger points (muscle knots/pain). It is skilled intervention that uses a thin filiform needle to penetrate the skin and stimulate underlying myofascial trigger points, muscular, and connective tissues for the management of neuromusculoskeletal pain and movement impairments.  A local twitch response (LTR) will be elicited.  This can sometimes feel like a deep ache in the muscle during the procedure. Multiple trigger points in multiple muscles can be treated during each treatment.  No medication of any kind is injected.   As with any medical treatment and procedure, there are possible adverse events.  While significant adverse events are uncommon, they do sometimes occur and must be considered prior to giving consent.  1. Dry needling often causes a "post needling soreness".  There can be an increase in pain from a couple of hours to 2-3 days, followed by an improvement in the overall pain state. 2. Any time a needle is used there is a risk of infection.  However, we are using new, sterile, and disposable needles; infections are extremely rare. 3. There is a possibility that you may bleed or bruise.  You may feel tired and some nausea following treatment. 4. There is a rare possibility of a pneumothorax (air in the chest cavity). 5. Allergic reaction to nickel in the stainless steel needle. 6. If a nerve is touched, it may cause paresthesia (a prickling/shock sensation) which is usually brief, but may continue for a couple of days.  Following treatment stay hydrated.  Continue regular activities but not too vigorous initially after treatment for 24-48 hours.  Dry Needling is best when combined with other physical therapy interventions such as  strengthening, stretching and other therapeutic modalities.   PLEASE ANSWER THE FOLLOWING QUESTIONS:  Do you have a lack of sensation?   Y/N  Do you have a phobia or fear of needles  Y/N  Are you pregnant?    Y/N If yes:  How many weeks? __________ Do you have any implanted devices?  Y/N If yes:  Pacemaker/Spinal Cord Stimulator/Deep Brain Stimulator/Insulin Pump/Other: ________________ Do you have any implants?  Y/N If yes: Breast/Facial/Pecs/Buttocks/Calves/Hip  Replacement/ Knee Replacement/Other: _________ Do you take any blood thinners?   Y/N If yes: Coumadin (Warfarin)/Other: ___________________ Do you have a bleeding disorder?   Y/N If yes: What kind: _________________________________ Do you take any immunosuppressants?  Y/N If yes:   What kind: _________________________________ Do you take anti-inflammatories?   Y/N If yes: What kind: Advil/Aspirin/Other: ________________ Have you ever been diagnosed with Scoliosis? Y/N Have you had back surgery?   Y/N If yes:  Laminectomy/Fusion/Other: ___________________   I have read, or had read to me, the above.  I have had the opportunity to ask any questions.  All of my questions have been answered to my satisfaction and I understand the risks involved with dry needling.  I consent to examination and treatment at [facility], including dry needling, of any and all of my involved and affected muscles.     Signature: __________________________________     Date:___________________________________________________    

## 2021-01-01 ENCOUNTER — Other Ambulatory Visit: Payer: Self-pay

## 2021-01-01 ENCOUNTER — Ambulatory Visit: Payer: Medicare Other | Admitting: Physical Therapy

## 2021-01-01 DIAGNOSIS — M542 Cervicalgia: Secondary | ICD-10-CM

## 2021-01-01 DIAGNOSIS — M25512 Pain in left shoulder: Secondary | ICD-10-CM

## 2021-01-01 NOTE — Therapy (Signed)
[facility] [address] [place], [place], [address] Phone: [phone]   Fax:  [phone]  Physical Therapy Treatment  Patient Details  Name: Michael Mcclure MRN: [id number] Date of Birth: [birth year] Referring Provider (PT): Paulene Floor.   Encounter Date: 01/01/2021   PT End of Session - 01/01/21 1348    Visit Number 6    Number of Visits 12    Date for PT Re-Evaluation 03/17/21    PT Start Time 0102    PT Stop Time 0158    PT Time Calculation (min) 56 min    Activity Tolerance Patient tolerated treatment well    Behavior During Therapy [facility] for tasks assessed/performed           Past Medical History:  Diagnosis Date  . Actinic keratitis 2017  . Anxiety   . Arthritis   . BPH (benign prostatic hyperplasia)   . Cataract   . GERD (gastroesophageal reflux disease)   . Hemorrhoids   . HOH (hard of hearing)   . Hx of colonic polyps   . Hyperlipidemia   . Hypothyroidism   . Keratosis, seborrheic 2017  . Pelvic fracture (HCC)   . Thyroid disease     Past Surgical History:  Procedure Laterality Date  . ANKLE SURGERY    . BACK SURGERY    . CARPAL TUNNEL RELEASE Right    x2  . CATARACT EXTRACTION W/PHACO Right 05/18/2016   Procedure: CATARACT EXTRACTION PHACO AND INTRAOCULAR LENS PLACEMENT RIGHT EYE; CDE:  5.35;  Surgeon: Gemma Payor, MD;  Location: [facility];  Service: Ophthalmology;  Laterality: Right;  . ROTATOR CUFF REPAIR    . SHOULDER SURGERY Right    x 2   . WRIST SURGERY Left    fracture    There were no vitals filed for this visit.   Subjective Assessment - 01/01/21 1345    Subjective COVID-19 screen performed prior to patient entering clinic.  Very good night of sleep.  No reports of pain to left shoulder since last treatment.    Pertinent History left wrist surgery; HOH; back surgery; CTR (right); left wrist and right shoulder surgery.    Patient Stated Goals Get out of pain and sleep better.    Currently in  Pain? Yes    Pain Score 3     Pain Location Shoulder    Pain Orientation Left    Pain Descriptors / Indicators Sore    Pain Type Acute pain                             [facility] Adult PT Treatment/Exercise - 01/01/21 0001      Modalities   Modalities Electrical Stimulation;Ultrasound;Moist Heat      Moist Heat Therapy   Number Minutes Moist Heat 20 Minutes    Moist Heat Location --   Left cervical.     Electrical Stimulation   Electrical Stimulation Location Left cervical/left UT    Electrical Stimulation Action IFC    Electrical Stimulation Parameters 80-150 Hz on 40% scan x 20 minutes.    Electrical Stimulation Goals Pain;Tone      Ultrasound   Ultrasound Location Left UT    Ultrasound Parameters Combo e'stim/US at 1.50 W/CM2 x 12 minutes.    Ultrasound Goals Pain      Manual Therapy   Manual Therapy Soft tissue mobilization    Soft tissue mobilization STW/M x 12 minutes to patient left cervical  paraspinal musculature and ischemic release technique to his left UT.                       PT Long Term Goals - 12/17/20 1156      PT LONG TERM GOAL #1   Title Pt will be independent in his HEP and progression.    Time 6    Period Weeks    Status New      PT LONG TERM GOAL #2   Title Eliminate left UE symptoms.    Time 6    Period Weeks    Status New      PT LONG TERM GOAL #3   Title Sleep 6 hours undisturbed.    Time 6    Period Weeks    Status New      PT LONG TERM GOAL #4   Title Perform ADL's with pain not > 2-3/10.    Time 6    Period Weeks    Status New                 Plan - 01/01/21 1350    Clinical Impression Statement Patient with a lowered pain-level today.  He states he slept well last and did not experience any radiating pain to his left shoulder.    Personal Factors and Comorbidities Comorbidity 1;Comorbidity 2    Comorbidities Left wrist surgery; HOH; back suregry; CTR (right); left wrist and right shoulder  surgery.    Examination-Activity Limitations Sleep;Other    Examination-Participation Restrictions Other    Stability/Clinical Decision Making Evolving/Moderate complexity    Rehab Potential Good    PT Treatment/Interventions ADLs/Self Care Home Management;Cryotherapy;Electrical Stimulation;Moist Heat;Traction;Ultrasound;Therapeutic exercise;Therapeutic activities;Patient/family education;Manual techniques;Passive range of motion    PT Next Visit Plan UBE, chin tucks and extension, RW4 (left shoulder), Combo e'stim/US to left C-spine/UT region, manual traction and then try intermittment mechanical tarction beginning at 13# if patient does well with manual traction.           Patient will benefit from skilled therapeutic intervention in order to improve the following deficits and impairments:  Pain,Decreased activity tolerance,Decreased range of motion,Postural dysfunction  Visit Diagnosis: Acute pain of left shoulder  Cervicalgia     Problem List Patient Active Problem List   Diagnosis Date Noted  . Impairment of balance 08/03/2019  . Ganglion cyst of wrist, left 01/24/2019  . Osteoporosis 01/19/2017  . Osteoarthritis of hip 11/02/2016  . CKD (chronic kidney disease) stage 3, GFR 30-59 ml/min (HCC) 10/02/2016  . Venous insufficiency 05/11/2016  . Dizziness 02/07/2016  . Sensorineural hearing loss (SNHL) of both ears 10/10/2015  . Hypothyroidism 09/20/2014  . Neuropathy 02/14/2014  . Lumbar scoliosis 02/14/2014  . Anterolisthesis 02/14/2014  . GERD (gastroesophageal reflux disease) 03/19/2011  . Hyperlipidemia 03/19/2011  . Neurogenic bladder 03/19/2011  . BPH (benign prostatic hyperplasia) 03/19/2011  . Arthritis 12/06/2008  . Back pain 12/06/2008  . CARPAL TUNNEL SYNDROME, HX OF 12/06/2008    Dellas Guard, [name] MPT 01/01/2021, 1:59 PM  [facility] [address] [place], [place], [address] Phone: [phone]   Fax:   [phone]  Name: Michael Mcclure MRN: [id number] Date of Birth: July 11, 1932

## 2021-01-02 ENCOUNTER — Other Ambulatory Visit: Payer: Self-pay

## 2021-01-02 ENCOUNTER — Ambulatory Visit: Payer: Medicare Other | Admitting: Physical Therapy

## 2021-01-02 DIAGNOSIS — M542 Cervicalgia: Secondary | ICD-10-CM

## 2021-01-02 DIAGNOSIS — M25512 Pain in left shoulder: Secondary | ICD-10-CM | POA: Diagnosis not present

## 2021-01-02 NOTE — Therapy (Signed)
[facility] [address] [place], [place], [address] Phone: [phone]   Fax:  [phone]  Physical Therapy Treatment  Patient Details  Name: TAEJON IRANI MRN: [id number] Date of Birth: [birth year] Referring Provider (PT): Paulene Floor.   Encounter Date: 01/02/2021   PT End of Session - 01/02/21 1126    Visit Number 7    Number of Visits 12    Date for PT Re-Evaluation 03/17/21    PT Start Time 1030    PT Stop Time 1125    PT Time Calculation (min) 55 min    Activity Tolerance Patient tolerated treatment well    Behavior During Therapy WFL for tasks assessed/performed           Past Medical History:  Diagnosis Date  . Actinic keratitis 2017  . Anxiety   . Arthritis   . BPH (benign prostatic hyperplasia)   . Cataract   . GERD (gastroesophageal reflux disease)   . Hemorrhoids   . HOH (hard of hearing)   . Hx of colonic polyps   . Hyperlipidemia   . Hypothyroidism   . Keratosis, seborrheic 2017  . Pelvic fracture (HCC)   . Thyroid disease     Past Surgical History:  Procedure Laterality Date  . ANKLE SURGERY    . BACK SURGERY    . CARPAL TUNNEL RELEASE Right    x2  . CATARACT EXTRACTION W/PHACO Right 05/18/2016   Procedure: CATARACT EXTRACTION PHACO AND INTRAOCULAR LENS PLACEMENT RIGHT EYE; CDE:  5.35;  Surgeon: Gemma Payor, MD;  Location: [facility];  Service: Ophthalmology;  Laterality: Right;  . ROTATOR CUFF REPAIR    . SHOULDER SURGERY Right    x 2   . WRIST SURGERY Left    fracture    There were no vitals filed for this visit.   Subjective Assessment - 01/02/21 1126    Subjective COVID-19 screen performed prior to patient entering clinic.  Doing better.  pain low today.  Turning head better.    Pertinent History left wrist surgery; HOH; back surgery; CTR (right); left wrist and right shoulder surgery.    Patient Stated Goals Get out of pain and sleep better.    Currently in Pain? Yes    Pain Score 2      Pain Location Neck    Pain Orientation Left    Pain Descriptors / Indicators Sore    Pain Type Acute pain    Pain Onset More than a month ago                             [facility] Adult PT Treatment/Exercise - 01/02/21 0001      Modalities   Modalities Electrical Stimulation;Moist Heat;Ultrasound      Moist Heat Therapy   Number Minutes Moist Heat 20 Minutes    Moist Heat Location --   Left cervical.     Electrical Stimulation   Electrical Stimulation Location left cervical/left UT.    Electrical Stimulation Action IFC    Electrical Stimulation Parameters 80-150 Hz x 20 minutes on 40% scan.    Electrical Stimulation Goals Pain;Tone      Ultrasound   Ultrasound Location Left UT    Ultrasound Parameters Combo e'stim/US at 1.50 W/CM2 x 12 minutes.    Ultrasound Goals Pain      Manual Therapy   Manual Therapy Soft tissue mobilization    Soft tissue mobilization STW/M x 11 minutes  to patient's left cervical paraspinal musculature and ischemic release to his left UT.                       PT Long Term Goals - 12/17/20 1156      PT LONG TERM GOAL #1   Title Pt will be independent in his HEP and progression.    Time 6    Period Weeks    Status New      PT LONG TERM GOAL #2   Title Eliminate left UE symptoms.    Time 6    Period Weeks    Status New      PT LONG TERM GOAL #3   Title Sleep 6 hours undisturbed.    Time 6    Period Weeks    Status New      PT LONG TERM GOAL #4   Title Perform ADL's with pain not > 2-3/10.    Time 6    Period Weeks    Status New                 Plan - 01/02/21 1130    Clinical Impression Statement Patient is doing much better.  His cervical range of motion is improving.  Tender    Personal Factors and Comorbidities Comorbidity 1;Comorbidity 2    Comorbidities Left wrist surgery; HOH; back suregry; CTR (right); left wrist and right shoulder surgery.    Examination-Activity Limitations Sleep;Other     Examination-Participation Restrictions Other    Stability/Clinical Decision Making Evolving/Moderate complexity    Rehab Potential Good    PT Treatment/Interventions ADLs/Self Care Home Management;Cryotherapy;Electrical Stimulation;Moist Heat;Traction;Ultrasound;Therapeutic exercise;Therapeutic activities;Patient/family education;Manual techniques;Passive range of motion    PT Next Visit Plan UBE, chin tucks and extension, RW4 (left shoulder), Combo e'stim/US to left C-spine/UT region, manual traction and then try intermittment mechanical tarction beginning at 13# if patient does well with manual traction.    Consulted and Agree with Plan of Care Patient           Patient will benefit from skilled therapeutic intervention in order to improve the following deficits and impairments:  Pain,Decreased activity tolerance,Decreased range of motion,Postural dysfunction  Visit Diagnosis: Acute pain of left shoulder  Cervicalgia     Problem List Patient Active Problem List   Diagnosis Date Noted  . Impairment of balance 08/03/2019  . Ganglion cyst of wrist, left 01/24/2019  . Osteoporosis 01/19/2017  . Osteoarthritis of hip 11/02/2016  . CKD (chronic kidney disease) stage 3, GFR 30-59 ml/min (HCC) 10/02/2016  . Venous insufficiency 05/11/2016  . Dizziness 02/07/2016  . Sensorineural hearing loss (SNHL) of both ears 10/10/2015  . Hypothyroidism 09/20/2014  . Neuropathy 02/14/2014  . Lumbar scoliosis 02/14/2014  . Anterolisthesis 02/14/2014  . GERD (gastroesophageal reflux disease) 03/19/2011  . Hyperlipidemia 03/19/2011  . Neurogenic bladder 03/19/2011  . BPH (benign prostatic hyperplasia) 03/19/2011  . Arthritis 12/06/2008  . Back pain 12/06/2008  . CARPAL TUNNEL SYNDROME, HX OF 12/06/2008    Mirenda Baltazar, [name] MPT 01/02/2021, 11:54 AM  [facility] [address] [place], [place], [address] Phone: [phone]   Fax:   [phone]  Name: JALIN ALICEA MRN: [id number] Date of Birth: December 17, 1931

## 2021-01-07 ENCOUNTER — Other Ambulatory Visit: Payer: Self-pay

## 2021-01-07 ENCOUNTER — Ambulatory Visit: Payer: Medicare Other | Attending: Nurse Practitioner | Admitting: *Deleted

## 2021-01-07 DIAGNOSIS — M25512 Pain in left shoulder: Secondary | ICD-10-CM | POA: Diagnosis not present

## 2021-01-07 DIAGNOSIS — M542 Cervicalgia: Secondary | ICD-10-CM | POA: Diagnosis present

## 2021-01-07 NOTE — Therapy (Signed)
[facility] [address] [place], [place], [address] Phone: [phone]   Fax:  [phone]  Physical Therapy Treatment  Patient Details  Name: Michael Mcclure MRN: [id number] Date of Birth: Dec 21, 1931 Referring Provider (PT): Paulene Floor.   Encounter Date: 01/07/2021   PT End of Session - 01/07/21 1156    Visit Number 8    Number of Visits 12    Date for PT Re-Evaluation 03/17/21    PT Start Time 1114    PT Stop Time 1201    PT Time Calculation (min) 47 min           Past Medical History:  Diagnosis Date  . Actinic keratitis 2017  . Anxiety   . Arthritis   . BPH (benign prostatic hyperplasia)   . Cataract   . GERD (gastroesophageal reflux disease)   . Hemorrhoids   . HOH (hard of hearing)   . Hx of colonic polyps   . Hyperlipidemia   . Hypothyroidism   . Keratosis, seborrheic 2017  . Pelvic fracture (HCC)   . Thyroid disease     Past Surgical History:  Procedure Laterality Date  . ANKLE SURGERY    . BACK SURGERY    . CARPAL TUNNEL RELEASE Right    x2  . CATARACT EXTRACTION W/PHACO Right 05/18/2016   Procedure: CATARACT EXTRACTION PHACO AND INTRAOCULAR LENS PLACEMENT RIGHT EYE; CDE:  5.35;  Surgeon: Gemma Payor, MD;  Location: [facility];  Service: Ophthalmology;  Laterality: Right;  . ROTATOR CUFF REPAIR    . SHOULDER SURGERY Right    x 2   . WRIST SURGERY Left    fracture    There were no vitals filed for this visit.   Subjective Assessment - 01/07/21 1155    Subjective COVID-19 screen performed prior to patient entering clinic.  Doing better.  pain low today.  Turning head better, but still hurts    Pertinent History left wrist surgery; HOH; back surgery; CTR (right); left wrist and right shoulder surgery.    Patient Stated Goals Get out of pain and sleep better.    Pain Score 2     Pain Location Neck    Pain Orientation Left    Pain Descriptors / Indicators Sore;Shooting    Pain Type Acute pain    Pain  Onset More than a month ago                             [facility] Adult PT Treatment/Exercise - 01/07/21 0001      Modalities   Modalities Electrical Stimulation;Moist Heat;Ultrasound      Moist Heat Therapy   Number Minutes Moist Heat 20 Minutes    Moist Heat Location --   Left cervical.     Electrical Stimulation   Electrical Stimulation Location left cervical/left UT.    Electrical Stimulation Action IFC    Electrical Stimulation Parameters 80-150hz  x 20 mins    Electrical Stimulation Goals Pain;Tone      Ultrasound   Ultrasound Location LT UT/ Levator    Ultrasound Parameters Combo 1.5 w/cm2 x 10 mins    Ultrasound Goals Pain      Manual Therapy   Manual Therapy Soft tissue mobilization    Soft tissue mobilization STW/M x 11 minutes to patient's left cervical paraspinal musculature and ischemic release to his left UT and levator with good release.  PT Long Term Goals - 12/17/20 1156      PT LONG TERM GOAL #1   Title Pt will be independent in his HEP and progression.    Time 6    Period Weeks    Status New      PT LONG TERM GOAL #2   Title Eliminate left UE symptoms.    Time 6    Period Weeks    Status New      PT LONG TERM GOAL #3   Title Sleep 6 hours undisturbed.    Time 6    Period Weeks    Status New      PT LONG TERM GOAL #4   Title Perform ADL's with pain not > 2-3/10.    Time 6    Period Weeks    Status New                 Plan - 01/07/21 1201    Clinical Impression Statement Pt arrived today doing better with increased LT cervical rotation motion, but still with pain. Good TPR noted during STW and decreased pain with rotaion end of session    Personal Factors and Comorbidities Comorbidity 1;Comorbidity 2    Comorbidities Left wrist surgery; HOH; back suregry; CTR (right); left wrist and right shoulder surgery.    Stability/Clinical Decision Making Evolving/Moderate complexity    Rehab  Potential Good    PT Treatment/Interventions ADLs/Self Care Home Management;Cryotherapy;Electrical Stimulation;Moist Heat;Traction;Ultrasound;Therapeutic exercise;Therapeutic activities;Patient/family education;Manual techniques;Passive range of motion    PT Next Visit Plan UBE, chin tucks and extension, RW4 (left shoulder), Combo e'stim/US to left C-spine/UT region, manual traction and then try intermittment mechanical tarction beginning at 13# if patient does well with manual traction.    Consulted and Agree with Plan of Care Patient           Patient will benefit from skilled therapeutic intervention in order to improve the following deficits and impairments:  Pain,Decreased activity tolerance,Decreased range of motion,Postural dysfunction  Visit Diagnosis: Acute pain of left shoulder  Cervicalgia     Problem List Patient Active Problem List   Diagnosis Date Noted  . Impairment of balance 08/03/2019  . Ganglion cyst of wrist, left 01/24/2019  . Osteoporosis 01/19/2017  . Osteoarthritis of hip 11/02/2016  . CKD (chronic kidney disease) stage 3, GFR 30-59 ml/min (HCC) 10/02/2016  . Venous insufficiency 05/11/2016  . Dizziness 02/07/2016  . Sensorineural hearing loss (SNHL) of both ears 10/10/2015  . Hypothyroidism 09/20/2014  . Neuropathy 02/14/2014  . Lumbar scoliosis 02/14/2014  . Anterolisthesis 02/14/2014  . GERD (gastroesophageal reflux disease) 03/19/2011  . Hyperlipidemia 03/19/2011  . Neurogenic bladder 03/19/2011  . BPH (benign prostatic hyperplasia) 03/19/2011  . Arthritis 12/06/2008  . Back pain 12/06/2008  . CARPAL TUNNEL SYNDROME, HX OF 12/06/2008    Dezarae Mcclaran,CHRIS, PTA 01/07/2021, 12:08 PM  [facility] [address] [place], [place], [address] Phone: [phone]   Fax:  [phone]  Name: Michael Mcclure MRN: [id number] Date of Birth: [birth year]

## 2021-01-09 ENCOUNTER — Other Ambulatory Visit: Payer: Self-pay

## 2021-01-09 ENCOUNTER — Ambulatory Visit: Payer: Medicare Other | Admitting: Physical Therapy

## 2021-01-09 DIAGNOSIS — M25512 Pain in left shoulder: Secondary | ICD-10-CM | POA: Diagnosis not present

## 2021-01-09 DIAGNOSIS — M542 Cervicalgia: Secondary | ICD-10-CM

## 2021-01-09 NOTE — Therapy (Signed)
[facility] [address] [place], [place], [address] Phone: [phone]   Fax:  [phone]  Physical Therapy Treatment  Patient Details  Name: Michael Mcclure MRN: [id number] Date of Birth: 10/30/1932 Referring Provider (PT): Paulene Floor.   Encounter Date: 01/09/2021   PT End of Session - 01/09/21 1208    Visit Number 9    Number of Visits 12    Date for PT Re-Evaluation 03/17/21    PT Start Time 1116    PT Stop Time 1202    PT Time Calculation (min) 46 min    Activity Tolerance Patient tolerated treatment well    Behavior During Therapy [facility] for tasks assessed/performed           Past Medical History:  Diagnosis Date  . Actinic keratitis 2017  . Anxiety   . Arthritis   . BPH (benign prostatic hyperplasia)   . Cataract   . GERD (gastroesophageal reflux disease)   . Hemorrhoids   . HOH (hard of hearing)   . Hx of colonic polyps   . Hyperlipidemia   . Hypothyroidism   . Keratosis, seborrheic 2017  . Pelvic fracture (HCC)   . Thyroid disease     Past Surgical History:  Procedure Laterality Date  . ANKLE SURGERY    . BACK SURGERY    . CARPAL TUNNEL RELEASE Right    x2  . CATARACT EXTRACTION W/PHACO Right 05/18/2016   Procedure: CATARACT EXTRACTION PHACO AND INTRAOCULAR LENS PLACEMENT RIGHT EYE; CDE:  5.35;  Surgeon: Gemma Payor, MD;  Location: [facility];  Service: Ophthalmology;  Laterality: Right;  . ROTATOR CUFF REPAIR    . SHOULDER SURGERY Right    x 2   . WRIST SURGERY Left    fracture    There were no vitals filed for this visit.   Subjective Assessment - 01/09/21 1208    Subjective COVID-19 screen performed prior to patient entering clinic.    This is helping me a whole lot.    Pertinent History left wrist surgery; HOH; back surgery; CTR (right); left wrist and right shoulder surgery.    Patient Stated Goals Get out of pain and sleep better.    Currently in Pain? Yes    Pain Score 2     Pain Location Neck     Pain Orientation Left    Pain Descriptors / Indicators Shooting    Pain Type Acute pain    Pain Onset More than a month ago                             [facility] Adult PT Treatment/Exercise - 01/09/21 0001      Electrical Stimulation   Electrical Stimulation Location Left cervical/UT    Electrical Stimulation Action IFC on 40% scan    Electrical Stimulation Parameters 80-150 Hz x 15 minutes.    Electrical Stimulation Goals Pain;Tone      Ultrasound   Ultrasound Location Left cervical.    Ultrasound Parameters Combo e'stim/US at 1.50 W/CM2 x 12 minutes.    Ultrasound Goals Pain      Manual Therapy   Manual Therapy Soft tissue mobilization    Soft tissue mobilization STW/M x 11 minutes to reduce tone over his left cervical/UT.                       PT Long Term Goals - 12/17/20 1156  PT LONG TERM GOAL #1   Title Pt will be independent in his HEP and progression.    Time 6    Period Weeks    Status New      PT LONG TERM GOAL #2   Title Eliminate left UE symptoms.    Time 6    Period Weeks    Status New      PT LONG TERM GOAL #3   Title Sleep 6 hours undisturbed.    Time 6    Period Weeks    Status New      PT LONG TERM GOAL #4   Title Perform ADL's with pain not > 2-3/10.    Time 6    Period Weeks    Status New                 Plan - 01/09/21 1212    Clinical Impression Statement Pain beginning to localize to left cervical region and away from shoulder.  Patient is very pleased with his progress.    Personal Factors and Comorbidities Comorbidity 1;Comorbidity 2    Comorbidities Left wrist surgery; HOH; back suregry; CTR (right); left wrist and right shoulder surgery.    Examination-Activity Limitations Sleep;Other    Examination-Participation Restrictions Other    Stability/Clinical Decision Making Evolving/Moderate complexity    Rehab Potential Good    PT Treatment/Interventions ADLs/Self Care Home  Management;Cryotherapy;Electrical Stimulation;Moist Heat;Traction;Ultrasound;Therapeutic exercise;Therapeutic activities;Patient/family education;Manual techniques;Passive range of motion    PT Next Visit Plan UBE, chin tucks and extension, RW4 (left shoulder), Combo e'stim/US to left C-spine/UT region, manual traction and then try intermittment mechanical tarction beginning at 13# if patient does well with manual traction.    Consulted and Agree with Plan of Care Patient           Patient will benefit from skilled therapeutic intervention in order to improve the following deficits and impairments:  Pain,Decreased activity tolerance,Decreased range of motion,Postural dysfunction  Visit Diagnosis: Acute pain of left shoulder  Cervicalgia     Problem List Patient Active Problem List   Diagnosis Date Noted  . Impairment of balance 08/03/2019  . Ganglion cyst of wrist, left 01/24/2019  . Osteoporosis 01/19/2017  . Osteoarthritis of hip 11/02/2016  . CKD (chronic kidney disease) stage 3, GFR 30-59 ml/min (HCC) 10/02/2016  . Venous insufficiency 05/11/2016  . Dizziness 02/07/2016  . Sensorineural hearing loss (SNHL) of both ears 10/10/2015  . Hypothyroidism 09/20/2014  . Neuropathy 02/14/2014  . Lumbar scoliosis 02/14/2014  . Anterolisthesis 02/14/2014  . GERD (gastroesophageal reflux disease) 03/19/2011  . Hyperlipidemia 03/19/2011  . Neurogenic bladder 03/19/2011  . BPH (benign prostatic hyperplasia) 03/19/2011  . Arthritis 12/06/2008  . Back pain 12/06/2008  . CARPAL TUNNEL SYNDROME, HX OF 12/06/2008    Ravis Herne, [name] MPT 01/09/2021, 12:13 PM  [facility] [address] [place], [place], [address] Phone: [phone]   Fax:  [phone]  Name: Michael Mcclure MRN: [id number] Date of Birth: [birth year]

## 2021-01-10 ENCOUNTER — Other Ambulatory Visit: Payer: Self-pay | Admitting: Family Medicine

## 2021-01-10 DIAGNOSIS — M1611 Unilateral primary osteoarthritis, right hip: Secondary | ICD-10-CM

## 2021-01-10 DIAGNOSIS — M4126 Other idiopathic scoliosis, lumbar region: Secondary | ICD-10-CM

## 2021-01-10 DIAGNOSIS — E039 Hypothyroidism, unspecified: Secondary | ICD-10-CM

## 2021-01-10 DIAGNOSIS — G8929 Other chronic pain: Secondary | ICD-10-CM

## 2021-01-12 ENCOUNTER — Other Ambulatory Visit: Payer: Self-pay | Admitting: Family Medicine

## 2021-01-14 ENCOUNTER — Ambulatory Visit: Payer: Medicare Other | Admitting: Physical Therapy

## 2021-01-14 ENCOUNTER — Other Ambulatory Visit: Payer: Self-pay

## 2021-01-14 DIAGNOSIS — M542 Cervicalgia: Secondary | ICD-10-CM

## 2021-01-14 DIAGNOSIS — M25512 Pain in left shoulder: Secondary | ICD-10-CM | POA: Diagnosis not present

## 2021-01-14 NOTE — Therapy (Signed)
[facility] [place], [place], [address] Phone: [phone]   Fax:  [phone]  Physical Therapy Treatment  Patient Details  Name: Michael Mcclure MRN: [id number] Date of Birth: [birth year] Referring Provider (PT): Ronnald Collum.   Encounter Date: 01/14/2021   PT End of Session - 01/14/21 1207    Visit Number 10    Number of Visits 12    Date for PT Re-Evaluation 03/17/21    PT Start Time 1116    PT Stop Time 1156    PT Time Calculation (min) 40 min    Activity Tolerance Patient tolerated treatment well    Behavior During Therapy [facility] for tasks assessed/performed           Past Medical History:  Diagnosis Date  . Actinic keratitis 2017  . Anxiety   . Arthritis   . BPH (benign prostatic hyperplasia)   . Cataract   . GERD (gastroesophageal reflux disease)   . Hemorrhoids   . HOH (hard of hearing)   . Hx of colonic polyps   . Hyperlipidemia   . Hypothyroidism   . Keratosis, seborrheic 2017  . Pelvic fracture ([place])   . Thyroid disease     Past Surgical History:  Procedure Laterality Date  . ANKLE SURGERY    . BACK SURGERY    . CARPAL TUNNEL RELEASE Right    x2  . CATARACT EXTRACTION W/PHACO Right 05/18/2016   Procedure: CATARACT EXTRACTION PHACO AND INTRAOCULAR LENS PLACEMENT RIGHT EYE; CDE:  5.35;  Surgeon: Tonny Branch, MD;  Location: [facility];  Service: Ophthalmology;  Laterality: Right;  . ROTATOR CUFF REPAIR    . SHOULDER SURGERY Right    x 2   . WRIST SURGERY Left    fracture    There were no vitals filed for this visit.   Subjective Assessment - 01/14/21 1207    Subjective COVID-19 screen performed prior to patient entering clinic.  Been doing very good since last treatment.  Some pain and left UE symptoms started to come back yesterday.    Pertinent History left wrist surgery; HOH; back surgery; CTR (right); left wrist and right shoulder surgery.    Patient Stated Goals Get out of pain and sleep  better.    Currently in Pain? Yes    Pain Score 3     Pain Location Neck    Pain Orientation Left    Pain Descriptors / Indicators Shooting    Pain Type Acute pain                             [facility] Adult PT Treatment/Exercise - 01/14/21 0001      Modalities   Modalities Electrical Stimulation;Moist Heat;Ultrasound      Moist Heat Therapy   Number Minutes Moist Heat 10 Minutes      Electrical Stimulation   Electrical Stimulation Location LT cervical.    Electrical Stimulation Action Pre-mod.    Electrical Stimulation Parameters 80-150 Hz (5 sec on and 5 sec off).      Ultrasound   Ultrasound Location Left cervical region.    Ultrasound Parameters Combo e'stim/US at 1.50 W/CM2 x 12 minutes.    Ultrasound Goals Pain      Manual Therapy   Manual Therapy Soft tissue mobilization    Soft tissue mobilization STW/M x 11 minutes to patient's left cervical region including UT and [employer].  PT Long Term Goals - 01/14/21 1212      PT LONG TERM GOAL #1   Title Pt will be independent in his HEP and progression.    Time 6    Period Weeks    Status Achieved      PT LONG TERM GOAL #2   Title Eliminate left UE symptoms.    Time 6    Period Weeks    Status Partially Met      PT LONG TERM GOAL #3   Title Sleep 6 hours undisturbed.    Time 6    Period Weeks    Status Partially Met      PT LONG TERM GOAL #4   Title Perform ADL's with pain not > 2-3/10.    Time 6    Period Weeks    Status Partially Met                 Plan - 01/14/21 1212    Clinical Impression Statement See "Therapy Note" section.    Personal Factors and Comorbidities Comorbidity 1;Comorbidity 2    Comorbidities Left wrist surgery; HOH; back suregry; CTR (right); left wrist and right shoulder surgery.    Examination-Activity Limitations Sleep;Other    Examination-Participation Restrictions Other    Stability/Clinical Decision Making  Evolving/Moderate complexity    Rehab Potential Good    PT Treatment/Interventions ADLs/Self Care Home Management;Cryotherapy;Electrical Stimulation;Moist Heat;Traction;Ultrasound;Therapeutic exercise;Therapeutic activities;Patient/family education;Manual techniques;Passive range of motion    PT Next Visit Plan UBE, chin tucks and extension, RW4 (left shoulder), Combo e'stim/US to left C-spine/UT region, manual traction and then try intermittment mechanical tarction beginning at 13# if patient does well with manual traction.    Consulted and Agree with Plan of Care Patient           Patient will benefit from skilled therapeutic intervention in order to improve the following deficits and impairments:  Pain,Decreased activity tolerance,Decreased range of motion,Postural dysfunction  Visit Diagnosis: Acute pain of left shoulder  Cervicalgia     Problem List Patient Active Problem List   Diagnosis Date Noted  . Impairment of balance 08/03/2019  . Ganglion cyst of wrist, left 01/24/2019  . Osteoporosis 01/19/2017  . Osteoarthritis of hip 11/02/2016  . CKD (chronic kidney disease) stage 3, GFR 30-59 ml/min (HCC) 10/02/2016  . Venous insufficiency 05/11/2016  . Dizziness 02/07/2016  . Sensorineural hearing loss (SNHL) of both ears 10/10/2015  . Hypothyroidism 09/20/2014  . Neuropathy 02/14/2014  . Lumbar scoliosis 02/14/2014  . Anterolisthesis 02/14/2014  . GERD (gastroesophageal reflux disease) 03/19/2011  . Hyperlipidemia 03/19/2011  . Neurogenic bladder 03/19/2011  . BPH (benign prostatic hyperplasia) 03/19/2011  . Arthritis 12/06/2008  . Back pain 12/06/2008  . CARPAL TUNNEL SYNDROME, HX OF 12/06/2008    Progress Note Reporting Period 12/17/20 to 01/14/21  See note below for Objective Data and Assessment of Progress/Goals. Very good progress toward goals.  Patient reporting a significant reduction in pain and left UE symptoms.      Troyce Gieske, [place] MPT 01/14/2021, 12:14  PM  [facility] [address] [place], [place], [address] Phone: [phone]   Fax:  [phone]  Name: Michael Mcclure MRN: [id number] Date of Birth: 04-13-1932

## 2021-01-16 ENCOUNTER — Ambulatory Visit: Payer: Medicare Other | Admitting: *Deleted

## 2021-01-16 ENCOUNTER — Other Ambulatory Visit: Payer: Self-pay

## 2021-01-16 DIAGNOSIS — M25512 Pain in left shoulder: Secondary | ICD-10-CM

## 2021-01-16 DIAGNOSIS — M542 Cervicalgia: Secondary | ICD-10-CM

## 2021-01-16 NOTE — Therapy (Signed)
[facility] [place], [place], [address] Phone: [phone]   Fax:  [phone]  Physical Therapy Treatment  Patient Details  Name: Michael Mcclure MRN: [id number] Date of Birth: Jul 16, 1932 Referring Provider (PT): Ronnald Collum.   Encounter Date: 01/16/2021   PT End of Session - 01/16/21 1153    Visit Number 11    Number of Visits 12    Date for PT Re-Evaluation 03/17/21    PT Start Time 1115    PT Stop Time 1205    PT Time Calculation (min) 50 min           Past Medical History:  Diagnosis Date  . Actinic keratitis 2017  . Anxiety   . Arthritis   . BPH (benign prostatic hyperplasia)   . Cataract   . GERD (gastroesophageal reflux disease)   . Hemorrhoids   . HOH (hard of hearing)   . Hx of colonic polyps   . Hyperlipidemia   . Hypothyroidism   . Keratosis, seborrheic 2017  . Pelvic fracture ([place])   . Thyroid disease     Past Surgical History:  Procedure Laterality Date  . ANKLE SURGERY    . BACK SURGERY    . CARPAL TUNNEL RELEASE Right    x2  . CATARACT EXTRACTION W/PHACO Right 05/18/2016   Procedure: CATARACT EXTRACTION PHACO AND INTRAOCULAR LENS PLACEMENT RIGHT EYE; CDE:  5.35;  Surgeon: Tonny Branch, MD;  Location: [facility];  Service: Ophthalmology;  Laterality: Right;  . ROTATOR CUFF REPAIR    . SHOULDER SURGERY Right    x 2   . WRIST SURGERY Left    fracture    There were no vitals filed for this visit.                      [facility] Adult PT Treatment/Exercise - 01/16/21 0001      Modalities   Modalities Electrical Stimulation;Moist Heat;Ultrasound      Moist Heat Therapy   Number Minutes Moist Heat 15 Minutes    Moist Heat Location Cervical      Electrical Stimulation   Electrical Stimulation Location LT cervical.paras/ UT    Electrical Stimulation Action premod    Electrical Stimulation Parameters 80-150hz  x 15 mins    Electrical Stimulation Goals Pain;Tone       Ultrasound   Ultrasound Location LT cerv paras/ UT    Ultrasound Parameters Combo estim x 10 mins 1.5 w/cm2    Ultrasound Goals Pain      Manual Therapy   Manual Therapy Soft tissue mobilization    Soft tissue mobilization STW/M / TPR x 13 minutes to patient's left cervical region including UT and [employer]. as well as cerv. Paras                       PT Long Term Goals - 01/14/21 1212      PT LONG TERM GOAL #1   Title Pt will be independent in his HEP and progression.    Time 6    Period Weeks    Status Achieved      PT LONG TERM GOAL #2   Title Eliminate left UE symptoms.    Time 6    Period Weeks    Status Partially Met      PT LONG TERM GOAL #3   Title Sleep 6 hours undisturbed.    Time 6    Period Weeks  Status Partially Met      PT LONG TERM GOAL #4   Title Perform ADL's with pain not > 2-3/10.    Time 6    Period Weeks    Status Partially Met                 Plan - 01/16/21 1123    Clinical Impression Statement Pt arrived today 2/10 LT sided neck pain. He did well with Rx with decreased  mm tone and TPR end of session. His pain is intermittent, but feels it when rotating LT, but less after session    Personal Factors and Comorbidities Comorbidity 1;Comorbidity 2    Comorbidities Left wrist surgery; HOH; back suregry; CTR (right); left wrist and right shoulder surgery.    Examination-Activity Limitations Sleep;Other    Examination-Participation Restrictions Other    Stability/Clinical Decision Making Evolving/Moderate complexity    Rehab Potential Good    PT Treatment/Interventions ADLs/Self Care Home Management;Cryotherapy;Electrical Stimulation;Moist Heat;Traction;Ultrasound;Therapeutic exercise;Therapeutic activities;Patient/family education;Manual techniques;Passive range of motion    PT Next Visit Plan UBE, chin tucks and extension, RW4 (left shoulder), Combo e'stim/US to left C-spine/UT region, manual traction and then try  intermittment mechanical tarction beginning at 13# if patient does well with manual traction.           Patient will benefit from skilled therapeutic intervention in order to improve the following deficits and impairments:  Pain,Decreased activity tolerance,Decreased range of motion,Postural dysfunction  Visit Diagnosis: Acute pain of left shoulder  Cervicalgia     Problem List Patient Active Problem List   Diagnosis Date Noted  . Impairment of balance 08/03/2019  . Ganglion cyst of wrist, left 01/24/2019  . Osteoporosis 01/19/2017  . Osteoarthritis of hip 11/02/2016  . CKD (chronic kidney disease) stage 3, GFR 30-59 ml/min (HCC) 10/02/2016  . Venous insufficiency 05/11/2016  . Dizziness 02/07/2016  . Sensorineural hearing loss (SNHL) of both ears 10/10/2015  . Hypothyroidism 09/20/2014  . Neuropathy 02/14/2014  . Lumbar scoliosis 02/14/2014  . Anterolisthesis 02/14/2014  . GERD (gastroesophageal reflux disease) 03/19/2011  . Hyperlipidemia 03/19/2011  . Neurogenic bladder 03/19/2011  . BPH (benign prostatic hyperplasia) 03/19/2011  . Arthritis 12/06/2008  . Back pain 12/06/2008  . CARPAL TUNNEL SYNDROME, HX OF 12/06/2008    Shari Natt,CHRIS, PTA 01/16/2021, 12:11 PM  [facility] [place], [place], [address] Phone: [phone]   Fax:  [phone]  Name: Michael Mcclure MRN: [id number] Date of Birth: [birth year]

## 2021-01-21 ENCOUNTER — Ambulatory Visit: Payer: Medicare Other | Admitting: *Deleted

## 2021-01-21 ENCOUNTER — Other Ambulatory Visit: Payer: Self-pay

## 2021-01-21 DIAGNOSIS — M25512 Pain in left shoulder: Secondary | ICD-10-CM | POA: Diagnosis not present

## 2021-01-21 DIAGNOSIS — M542 Cervicalgia: Secondary | ICD-10-CM

## 2021-01-21 NOTE — Therapy (Signed)
[facility] [place], [place], [address] Phone: [phone]   Fax:  [phone]  Physical Therapy Treatment  Patient Details  Name: Michael Mcclure MRN: [id number] Date of Birth: [birth year] Referring Provider (PT): Ronnald Collum.   Encounter Date: 01/21/2021   PT End of Session - 01/21/21 1154    Visit Number 12    Number of Visits 12    Date for PT Re-Evaluation 03/17/21    PT Start Time 1115    PT Stop Time 1202    PT Time Calculation (min) 47 min    Activity Tolerance Patient tolerated treatment well           Past Medical History:  Diagnosis Date  . Actinic keratitis 2017  . Anxiety   . Arthritis   . BPH (benign prostatic hyperplasia)   . Cataract   . GERD (gastroesophageal reflux disease)   . Hemorrhoids   . HOH (hard of hearing)   . Hx of colonic polyps   . Hyperlipidemia   . Hypothyroidism   . Keratosis, seborrheic 2017  . Pelvic fracture ([place])   . Thyroid disease     Past Surgical History:  Procedure Laterality Date  . ANKLE SURGERY    . BACK SURGERY    . CARPAL TUNNEL RELEASE Right    x2  . CATARACT EXTRACTION W/PHACO Right 05/18/2016   Procedure: CATARACT EXTRACTION PHACO AND INTRAOCULAR LENS PLACEMENT RIGHT EYE; CDE:  5.35;  Surgeon: Tonny Branch, MD;  Location: [facility];  Service: Ophthalmology;  Laterality: Right;  . ROTATOR CUFF REPAIR    . SHOULDER SURGERY Right    x 2   . WRIST SURGERY Left    fracture    There were no vitals filed for this visit.   Subjective Assessment - 01/21/21 1150    Subjective COVID-19 screen performed prior to patient entering clinic.  Hurting some todayLt shldr/ neck    Pertinent History left wrist surgery; HOH; back surgery; CTR (right); left wrist and right shoulder surgery.    Patient Stated Goals Get out of pain and sleep better.    Currently in Pain? Yes    Pain Score 4     Pain Location Neck    Pain Orientation Left    Pain Descriptors / Indicators  Shooting    Pain Type Acute pain    Pain Onset More than a month ago                             [facility] Adult PT Treatment/Exercise - 01/21/21 0001      Modalities   Modalities Electrical Stimulation;Moist Heat;Ultrasound      Moist Heat Therapy   Number Minutes Moist Heat 15 Minutes    Moist Heat Location Cervical      Electrical Stimulation   Electrical Stimulation Location LT cervical.paras/ UT    Electrical Stimulation Action Premod    Electrical Stimulation Parameters 80-_0  x 15 mins    Electrical Stimulation Goals Pain;Tone      Ultrasound   Ultrasound Location LT cervical paras/UT    Ultrasound Parameters Combo 1.5 w/cm2 x 12 mins    Ultrasound Goals Pain      Manual Therapy   Manual Therapy Soft tissue mobilization    Soft tissue mobilization STW/M / TPR x 12 minutes to patient's left cervical region including UT and [employer]. as well as cerv. Paras  PT Long Term Goals - 01/21/21 1158      PT LONG TERM GOAL #1   Title Pt will be independent in his HEP and progression.    Time 6    Period Weeks    Status Achieved      PT LONG TERM GOAL #2   Title Eliminate left UE symptoms.    Baseline 14/56 09/07/19    Time 6    Period Weeks    Status Partially Met      PT LONG TERM GOAL #3   Title Sleep 6 hours undisturbed.    Time 6    Period Weeks    Status Partially Met      PT LONG TERM GOAL #4   Title Perform ADL's with pain not > 2-3/10.    Time 6    Period Weeks    Status Partially Met                 Plan - 01/21/21 1155    Clinical Impression Statement Pt arrived today reporting 50-60% better overall since starting PT. Rx focused on LT side musculature with  [place] f/b STW and TPR to LT UT, Levator and cervical paras with good release f/b estim and heat. Pt has partially met most of LTGs and would like 6 more visits for progression    Personal Factors and Comorbidities Comorbidity 1;Comorbidity 2     Comorbidities Left wrist surgery; HOH; back suregry; CTR (right); left wrist and right shoulder surgery.    Examination-Activity Limitations Sleep;Other    Examination-Participation Restrictions Other    Rehab Potential Good    PT Treatment/Interventions ADLs/Self Care Home Management;Cryotherapy;Electrical Stimulation;Moist Heat;Traction;Ultrasound;Therapeutic exercise;Therapeutic activities;Patient/family education;Manual techniques;Passive range of motion    PT Next Visit Plan UBE, chin tucks and extension, RW4 (left shoulder), Combo e'stim/US to left C-spine/UT region, manual traction. Recert for 6 more visits    Consulted and Agree with Plan of Care Patient           Patient will benefit from skilled therapeutic intervention in order to improve the following deficits and impairments:  Pain,Decreased activity tolerance,Decreased range of motion,Postural dysfunction  Visit Diagnosis: Cervicalgia  Acute pain of left shoulder     Problem List Patient Active Problem List   Diagnosis Date Noted  . Impairment of balance 08/03/2019  . Ganglion cyst of wrist, left 01/24/2019  . Osteoporosis 01/19/2017  . Osteoarthritis of hip 11/02/2016  . CKD (chronic kidney disease) stage 3, GFR 30-59 ml/min (HCC) 10/02/2016  . Venous insufficiency 05/11/2016  . Dizziness 02/07/2016  . Sensorineural hearing loss (SNHL) of both ears 10/10/2015  . Hypothyroidism 09/20/2014  . Neuropathy 02/14/2014  . Lumbar scoliosis 02/14/2014  . Anterolisthesis 02/14/2014  . GERD (gastroesophageal reflux disease) 03/19/2011  . Hyperlipidemia 03/19/2011  . Neurogenic bladder 03/19/2011  . BPH (benign prostatic hyperplasia) 03/19/2011  . Arthritis 12/06/2008  . Back pain 12/06/2008  . CARPAL TUNNEL SYNDROME, HX OF 12/06/2008    Marilena Trevathan,CHRIS, PTA 01/21/2021, 12:09 PM  [facility] [place], [place], [address] Phone: [phone]   Fax:   [phone]  Name: CALAHAN PAK MRN: [id number] Date of Birth: 11/02/1932

## 2021-01-23 ENCOUNTER — Ambulatory Visit (INDEPENDENT_AMBULATORY_CARE_PROVIDER_SITE_OTHER): Payer: No Typology Code available for payment source

## 2021-01-23 ENCOUNTER — Ambulatory Visit (INDEPENDENT_AMBULATORY_CARE_PROVIDER_SITE_OTHER): Payer: Medicare Other | Admitting: Family Medicine

## 2021-01-23 ENCOUNTER — Encounter: Payer: Self-pay | Admitting: Family Medicine

## 2021-01-23 ENCOUNTER — Ambulatory Visit: Payer: Medicare Other | Admitting: *Deleted

## 2021-01-23 ENCOUNTER — Other Ambulatory Visit: Payer: Self-pay

## 2021-01-23 VITALS — BP 135/77 | HR 81 | Temp 97.8°F | Resp 20 | Ht 71.0 in | Wt 193.0 lb

## 2021-01-23 DIAGNOSIS — M25512 Pain in left shoulder: Secondary | ICD-10-CM

## 2021-01-23 DIAGNOSIS — G8929 Other chronic pain: Secondary | ICD-10-CM

## 2021-01-23 DIAGNOSIS — M5412 Radiculopathy, cervical region: Secondary | ICD-10-CM | POA: Diagnosis not present

## 2021-01-23 DIAGNOSIS — M542 Cervicalgia: Secondary | ICD-10-CM

## 2021-01-23 MED ORDER — PREDNISONE 10 MG PO TABS: ORAL_TABLET | ORAL | 0 refills | Status: DC

## 2021-01-23 NOTE — Therapy (Signed)
[facility] [place], [place], [address] Phone: [phone]   Fax:  [phone]  Physical Therapy Treatment  Patient Details  Name: KAINOA SWOBODA MRN: [id number] Date of Birth: [birth year] Referring Provider (PT): Ronnald Collum.   Encounter Date: 01/23/2021   PT End of Session - 01/23/21 1155    Visit Number 13    Number of Visits 18    Date for PT Re-Evaluation 03/17/21    PT Start Time 1115    PT Stop Time 1205    PT Time Calculation (min) 50 min           Past Medical History:  Diagnosis Date  . Actinic keratitis 2017  . Anxiety   . Arthritis   . BPH (benign prostatic hyperplasia)   . Cataract   . GERD (gastroesophageal reflux disease)   . Hemorrhoids   . HOH (hard of hearing)   . Hx of colonic polyps   . Hyperlipidemia   . Hypothyroidism   . Keratosis, seborrheic 2017  . Pelvic fracture ([place])   . Thyroid disease     Past Surgical History:  Procedure Laterality Date  . ANKLE SURGERY    . BACK SURGERY    . CARPAL TUNNEL RELEASE Right    x2  . CATARACT EXTRACTION W/PHACO Right 05/18/2016   Procedure: CATARACT EXTRACTION PHACO AND INTRAOCULAR LENS PLACEMENT RIGHT EYE; CDE:  5.35;  Surgeon: Tonny Branch, MD;  Location: [facility];  Service: Ophthalmology;  Laterality: Right;  . ROTATOR CUFF REPAIR    . SHOULDER SURGERY Right    x 2   . WRIST SURGERY Left    fracture    There were no vitals filed for this visit.   Subjective Assessment - 01/23/21 1155    Subjective COVID-19 screen performed prior to patient entering clinic.  Hurting some todayLt shldr/ neck. Doing better overall, but still getteing radiating pain down LT arm. Will f/u with MD    Pertinent History left wrist surgery; HOH; back surgery; CTR (right); left wrist and right shoulder surgery.    Patient Stated Goals Get out of pain and sleep better.    Currently in Pain? Yes    Pain Score 5     Pain Location Neck    Pain Orientation Left     Pain Descriptors / Indicators Shooting    Pain Type Acute pain    Pain Onset More than a month ago                                          PT Long Term Goals - 01/23/21 1157      PT LONG TERM GOAL #1   Title Pt will be independent in his HEP and progression.    Period Weeks    Status Achieved      PT LONG TERM GOAL #2   Title Eliminate left UE symptoms.    Time 6    Period Weeks    Status Not Met      PT LONG TERM GOAL #3   Title Sleep 6 hours undisturbed.    Time 6    Period Weeks    Status Partially Met      PT LONG TERM GOAL #4   Title Perform ADL's with pain not > 2-3/10.    Time 6    Period Weeks    Status  Partially Met                 Plan - 01/23/21 1159    Clinical Impression Statement Pt arrived today doing fair, but reports radiating LT UE pain is still there. He is able to turn LT now with less pain and ncreased ROM. He did well withRx today and less pain end of session , but will be put on hold untill MD f/u    Personal Factors and Comorbidities Comorbidity 1;Comorbidity 2    Comorbidities Left wrist surgery; HOH; back suregry; CTR (right); left wrist and right shoulder surgery.    Examination-Activity Limitations Sleep;Other    Examination-Participation Restrictions Other    Stability/Clinical Decision Making Evolving/Moderate complexity    Rehab Potential Good    PT Treatment/Interventions ADLs/Self Care Home Management;Cryotherapy;Electrical Stimulation;Moist Heat;Traction;Ultrasound;Therapeutic exercise;Therapeutic activities;Patient/family education;Manual techniques;Passive range of motion    PT Next Visit Plan UBE, chin tucks and extension, RW4 (left shoulder), Combo e'stim/US to left C-spine/UT region, manual traction. Recert for 6 more visits. Pt asked to be placed on hold at this time to f/u with MD about referrd pain into LT UE.           Patient will benefit from skilled therapeutic intervention in order  to improve the following deficits and impairments:  Pain,Decreased activity tolerance,Decreased range of motion,Postural dysfunction  Visit Diagnosis: Cervicalgia  Acute pain of left shoulder     Problem List Patient Active Problem List   Diagnosis Date Noted  . Impairment of balance 08/03/2019  . Ganglion cyst of wrist, left 01/24/2019  . Osteoporosis 01/19/2017  . Osteoarthritis of hip 11/02/2016  . CKD (chronic kidney disease) stage 3, GFR 30-59 ml/min (HCC) 10/02/2016  . Venous insufficiency 05/11/2016  . Dizziness 02/07/2016  . Sensorineural hearing loss (SNHL) of both ears 10/10/2015  . Hypothyroidism 09/20/2014  . Neuropathy 02/14/2014  . Lumbar scoliosis 02/14/2014  . Anterolisthesis 02/14/2014  . GERD (gastroesophageal reflux disease) 03/19/2011  . Hyperlipidemia 03/19/2011  . Neurogenic bladder 03/19/2011  . BPH (benign prostatic hyperplasia) 03/19/2011  . Arthritis 12/06/2008  . Back pain 12/06/2008  . CARPAL TUNNEL SYNDROME, HX OF 12/06/2008    Loredana Medellin,CHRIS, PTA 01/23/2021, 12:59 PM  [facility] [place], [place], [address] Phone: [phone]   Fax:  [phone]  Name: LYNDLE PANG MRN: [id number] Date of Birth: Jun 17, 1932

## 2021-01-23 NOTE — Progress Notes (Signed)
Subjective:  Patient ID: Michael Mcclure, male    DOB: 05-09-1932  Age: 87 y.o. MRN: [id number]  CC: Left shoulder pain   HPI Michael Mcclure presents for left shoulder pain continues. PT reported that ROM is better with less pain at the end of todays session.  Michael Mcclure on the other hand feels that he is still having significant discomfort.  He can move the arm okay at the left shoulder.  However, once he finishes using it it will start hurting a lot.  The pain is not significantly better.  He says the pain goes from behind his left ear across the upper shoulder into the deltoid region and down into the upper bicep region.  He cannot describe it.  It is moderate in severity.  He did get some relief with a steroid pack from urgent care several months ago when it first started.  Depression screen [facility] 2/9 01/23/2021 12/12/2020 08/09/2020  Decreased Interest 0 0 0  Down, Depressed, Hopeless 0 0 0  PHQ - 2 Score 0 0 0  Altered sleeping - - -  Tired, decreased energy - - -  Change in appetite - - -  Feeling bad or failure about yourself  - - -  Trouble concentrating - - -  Moving slowly or fidgety/restless - - -  Suicidal thoughts - - -  PHQ-9 Score - - -  Some recent data might be hidden    History Michael Mcclure has a past medical history of Actinic keratitis (2017), Anxiety, Arthritis, BPH (benign prostatic hyperplasia), Cataract, GERD (gastroesophageal reflux disease), Hemorrhoids, HOH (hard of hearing), colonic polyps, Hyperlipidemia, Hypothyroidism, Keratosis, seborrheic (2017), Pelvic fracture (HCC), and Thyroid disease.   He has a past surgical history that includes Back surgery; Wrist surgery (Left); Shoulder surgery (Right); Rotator cuff repair; Carpal tunnel release (Right); Cataract extraction w/PHACO (Right, 05/18/2016); and Ankle surgery.   His family history includes Colon cancer in his mother; Colon polyps in his mother; Diabetes in his daughter and son; Emphysema in his father and  son; Heart disease in his brother, brother, and father.He reports that he has never smoked. He has never used smokeless tobacco. He reports that he does not drink alcohol and does not use drugs.    ROS Review of Systems  Constitutional: Negative for fever.  Respiratory: Negative for shortness of breath.   Cardiovascular: Negative for chest pain.  Musculoskeletal: Positive for arthralgias and neck pain.  Skin: Negative for rash.    Objective:  BP 135/77   Pulse 81   Temp 97.8 F (36.6 C) (Temporal)   Resp 20   Ht 5\' 11"  (1.803 m)   Wt 193 lb (87.5 kg)   SpO2 97%   BMI 26.92 kg/m   BP Readings from Last 3 Encounters:  01/23/21 135/77  12/12/20 118/60  08/09/20 113/62    Wt Readings from Last 3 Encounters:  01/23/21 193 lb (87.5 kg)  12/12/20 179 lb (81.2 kg)  08/09/20 187 lb 9.6 oz (85.1 kg)     Physical Exam Vitals reviewed.  Constitutional:      Appearance: He is well-developed and well-nourished.  HENT:     Head: Normocephalic and atraumatic.     Right Ear: External ear normal.     Left Ear: External ear normal.     Mouth/Throat:     Pharynx: No oropharyngeal exudate or posterior oropharyngeal erythema.  Eyes:     Pupils: Pupils are equal, round, and reactive to light.  Cardiovascular:  Rate and Rhythm: Normal rate and regular rhythm.     Heart sounds: No murmur heard.   Pulmonary:     Effort: No respiratory distress.     Breath sounds: Normal breath sounds.  Musculoskeletal:        General: Tenderness (left mastoid area to the left superior trapezius and down the arm slightly) present. Normal range of motion.     Cervical back: Normal range of motion and neck supple.  Neurological:     Mental Status: He is alert and oriented to person, place, and time.    Left shoulder XR - no acute changes noted.wdsx C-spine XR - pending   Assessment & Plan:   Michael Mcclure was seen today for left shoulder pain.  Diagnoses and all orders for this  visit:  Chronic left shoulder pain -     DG Shoulder Left; Future -     Ambulatory referral to Orthopedics -     DG Cervical Spine Complete; Future  Cervical radiculopathy at C5 -     DG Cervical Spine Complete; Future  Other orders -     predniSONE (DELTASONE) 10 MG tablet; Take 5 daily for 3 days followed by 4,3,2 and 1 for 3 days each.       I have discontinued Michael Mcclure. Mcclure's predniSONE. I am also having him start on predniSONE. Additionally, I am having him maintain his cholecalciferol, Flax Seed Oil, multivitamin with minerals, Xiidra, Spacer/Aero-Holding Chambers, albuterol, alendronate, diclofenac, ezetimibe-simvastatin, triamcinolone ointment, tamsulosin, pantoprazole, citalopram, levothyroxine, gabapentin, nabumetone, and clopidogrel.  Allergies as of 01/23/2021      Reactions   Hydrocodone Shortness Of Breath   Ciprofloxacin Other (See Comments)   Question caused short of breath.      Medication List       Accurate as of January 23, 2021  4:57 PM. If you have any questions, ask your nurse or doctor.        albuterol 108 (90 Base) MCG/ACT inhaler Commonly known as: VENTOLIN HFA TAKE 2 PUFFS BY MOUTH EVERY 6 HOURS AS NEEDED FOR WHEEZE OR SHORTNESS OF BREATH   alendronate 70 MG tablet Commonly known as: FOSAMAX TAKE 1 TABLET BY MOUTH ONCE A WEEK WITH A FULL GLASS OF WATER ON AN EMPTY STOMACH.   cholecalciferol 1000 units tablet Commonly known as: VITAMIN D Take 1,000 Units by mouth daily.   citalopram 20 MG tablet Commonly known as: CELEXA TAKE 1 TABLET BY MOUTH EVERY DAY   clopidogrel 75 MG tablet Commonly known as: PLAVIX TAKE 1 TABLET BY MOUTH EVERY DAY   diclofenac 1.3 % Ptch Commonly known as: Flector Place 1 patch onto the skin 2 (two) times daily.   ezetimibe-simvastatin 10-40 MG tablet Commonly known as: VYTORIN Take 1 tablet by mouth daily at 6 PM.   Flax Seed Oil 1000 MG Caps Take 1 capsule by mouth daily.   gabapentin 800 MG  tablet Commonly known as: NEURONTIN TAKE 2 TABLETS BY MOUTH 2 TIMES DAILY.   levothyroxine 25 MCG tablet Commonly known as: SYNTHROID TAKE 1 TABLET BY MOUTH EVERY DAY   multivitamin with minerals tablet Take 1 tablet by mouth daily.   nabumetone 500 MG tablet Commonly known as: RELAFEN TAKE 2 TABLETS BY MOUTH 2 TIMES DAILY. FOR MUSCLE AND JOINT PAIN, ESPECIALLY AT THE RIGHT HIP   pantoprazole 40 MG tablet Commonly known as: PROTONIX TAKE 1 TABLET BY MOUTH EVERY DAY   predniSONE 10 MG tablet Commonly known as: DELTASONE Take 5 daily for 3  days followed by 4,3,2 and 1 for 3 days each. What changed:   medication strength  additional instructions Changed by: Mechele Claude, MD   Spacer/Aero-Holding [facility] 1 Device by Does not apply route as needed.   tamsulosin 0.4 MG Caps capsule Commonly known as: FLOMAX TAKE 2 CAPSULES (0.8 MG TOTAL) BY MOUTH AT BEDTIME.   triamcinolone ointment 0.5 % Commonly known as: KENALOG APPLY TOPICALLY TWICE DAILY AS NEEDED   Xiidra 5 % Soln Generic drug: Lifitegrast        Follow-up: Return in about 1 month (around 02/20/2021).  Mechele Claude, M.D.

## 2021-01-24 ENCOUNTER — Other Ambulatory Visit (INDEPENDENT_AMBULATORY_CARE_PROVIDER_SITE_OTHER): Payer: No Typology Code available for payment source

## 2021-01-24 DIAGNOSIS — G8929 Other chronic pain: Secondary | ICD-10-CM | POA: Diagnosis not present

## 2021-01-24 DIAGNOSIS — M25512 Pain in left shoulder: Secondary | ICD-10-CM | POA: Diagnosis not present

## 2021-01-24 DIAGNOSIS — M5412 Radiculopathy, cervical region: Secondary | ICD-10-CM

## 2021-01-26 ENCOUNTER — Encounter: Payer: Self-pay | Admitting: Family Medicine

## 2021-01-26 ENCOUNTER — Other Ambulatory Visit: Payer: Self-pay | Admitting: Family Medicine

## 2021-01-26 DIAGNOSIS — G959 Disease of spinal cord, unspecified: Secondary | ICD-10-CM

## 2021-01-26 DIAGNOSIS — M4802 Spinal stenosis, cervical region: Secondary | ICD-10-CM | POA: Insufficient documentation

## 2021-01-27 ENCOUNTER — Telehealth: Payer: Self-pay | Admitting: Family Medicine

## 2021-01-28 ENCOUNTER — Other Ambulatory Visit: Payer: Self-pay

## 2021-01-28 ENCOUNTER — Ambulatory Visit: Payer: Medicare Other | Admitting: Physical Therapy

## 2021-01-28 DIAGNOSIS — M542 Cervicalgia: Secondary | ICD-10-CM

## 2021-01-28 DIAGNOSIS — M25512 Pain in left shoulder: Secondary | ICD-10-CM | POA: Diagnosis not present

## 2021-01-28 NOTE — Therapy (Signed)
[facility] [place], [place], [address] Phone: [phone]   Fax:  [phone]  Physical Therapy Treatment  Patient Details  Name: Michael Mcclure MRN: [id number] Date of Birth: 1932/10/29 Referring Provider (PT): Ronnald Collum.   Encounter Date: 01/28/2021   PT End of Session - 01/28/21 1340    Visit Number 14    Number of Visits 18    Date for PT Re-Evaluation 03/17/21    PT Start Time 0100    PT Stop Time 0150    PT Time Calculation (min) 50 min    Activity Tolerance Patient tolerated treatment well    Behavior During Therapy [facility] for tasks assessed/performed           Past Medical History:  Diagnosis Date  . Actinic keratitis 2017  . Anxiety   . Arthritis   . BPH (benign prostatic hyperplasia)   . Cataract   . GERD (gastroesophageal reflux disease)   . Hemorrhoids   . HOH (hard of hearing)   . Hx of colonic polyps   . Hyperlipidemia   . Hypothyroidism   . Keratosis, seborrheic 2017  . Pelvic fracture ([place])   . Thyroid disease     Past Surgical History:  Procedure Laterality Date  . ANKLE SURGERY    . BACK SURGERY    . CARPAL TUNNEL RELEASE Right    x2  . CATARACT EXTRACTION W/PHACO Right 05/18/2016   Procedure: CATARACT EXTRACTION PHACO AND INTRAOCULAR LENS PLACEMENT RIGHT EYE; CDE:  5.35;  Surgeon: Tonny Branch, MD;  Location: [facility];  Service: Ophthalmology;  Laterality: Right;  . ROTATOR CUFF REPAIR    . SHOULDER SURGERY Right    x 2   . WRIST SURGERY Left    fracture    There were no vitals filed for this visit.   Subjective Assessment - 01/28/21 1339    Subjective COVID-19 screen performed prior to patient entering clinic.  Doctor put me on a steroid and its helping.    Pertinent History left wrist surgery; HOH; back surgery; CTR (right); left wrist and right shoulder surgery.    Patient Stated Goals Get out of pain and sleep better.    Currently in Pain? Yes    Pain Score 3     Pain  Orientation Left    Pain Descriptors / Indicators Shooting    Pain Type Acute pain    Pain Onset More than a month ago                             [facility] Adult PT Treatment/Exercise - 01/28/21 0001      Modalities   Modalities Electrical Stimulation;Cryotherapy      Moist Heat Therapy   Number Minutes Moist Heat 15 Minutes    Moist Heat Location --   Left cervical.     Electrical Stimulation   Electrical Stimulation Location Left cervical.    Electrical Stimulation Action IFC on 40% scan.    Electrical Stimulation Parameters 80-150 Hz x 15 minutes.      Ultrasound   Ultrasound Location Left cervical.    Ultrasound Parameters Combo e'stim/US at 1.50 W/CM2 x 12 minutes.    Ultrasound Goals Pain      Manual Therapy   Manual Therapy Soft tissue mobilization    Soft tissue mobilization STW/M x 11 minutes.  PT Long Term Goals - 01/23/21 1157      PT LONG TERM GOAL #1   Title Pt will be independent in his HEP and progression.    Period Weeks    Status Achieved      PT LONG TERM GOAL #2   Title Eliminate left UE symptoms.    Time 6    Period Weeks    Status Not Met      PT LONG TERM GOAL #3   Title Sleep 6 hours undisturbed.    Time 6    Period Weeks    Status Partially Met      PT LONG TERM GOAL #4   Title Perform ADL's with pain not > 2-3/10.    Time 6    Period Weeks    Status Partially Met                 Plan - 01/28/21 1344    Clinical Impression Statement Patient now on Prednisone and this has made his neck feel better.  He was able to sleep better last night.  He requested a cold pack today as he states this helps him at home.    Personal Factors and Comorbidities Comorbidity 1;Comorbidity 2    Comorbidities Left wrist surgery; HOH; back suregry; CTR (right); left wrist and right shoulder surgery.    Examination-Activity Limitations Sleep;Other    Examination-Participation Restrictions Other     Stability/Clinical Decision Making Evolving/Moderate complexity    Rehab Potential Good    PT Treatment/Interventions ADLs/Self Care Home Management;Cryotherapy;Electrical Stimulation;Moist Heat;Traction;Ultrasound;Therapeutic exercise;Therapeutic activities;Patient/family education;Manual techniques;Passive range of motion    PT Next Visit Plan UBE, chin tucks and extension, RW4 (left shoulder), Combo e'stim/US to left C-spine/UT region, manual traction. Recert for 6 more visits. Pt asked to be placed on hold at this time to f/u with MD about referrd pain into LT UE.    Consulted and Agree with Plan of Care Patient           Patient will benefit from skilled therapeutic intervention in order to improve the following deficits and impairments:  Pain,Decreased activity tolerance,Decreased range of motion,Postural dysfunction  Visit Diagnosis: Cervicalgia     Problem List Patient Active Problem List   Diagnosis Date Noted  . Neural foraminal stenosis of cervical spine 01/26/2021  . Impairment of balance 08/03/2019  . Ganglion cyst of wrist, left 01/24/2019  . Osteoporosis 01/19/2017  . Osteoarthritis of hip 11/02/2016  . CKD (chronic kidney disease) stage 3, GFR 30-59 ml/min (HCC) 10/02/2016  . Venous insufficiency 05/11/2016  . Dizziness 02/07/2016  . Sensorineural hearing loss (SNHL) of both ears 10/10/2015  . Hypothyroidism 09/20/2014  . Neuropathy 02/14/2014  . Lumbar scoliosis 02/14/2014  . Anterolisthesis 02/14/2014  . GERD (gastroesophageal reflux disease) 03/19/2011  . Hyperlipidemia 03/19/2011  . Neurogenic bladder 03/19/2011  . BPH (benign prostatic hyperplasia) 03/19/2011  . Arthritis 12/06/2008  . Back pain 12/06/2008  . CARPAL TUNNEL SYNDROME, HX OF 12/06/2008    Amulya Quintin, [place] MPT 01/28/2021, 1:53 PM  [facility] [address] [place], [place], [address] Phone: [phone]   Fax:  [phone]  Name: Michael Mcclure MRN: [id number] Date of Birth: [birth year]

## 2021-01-30 ENCOUNTER — Other Ambulatory Visit: Payer: Self-pay

## 2021-01-30 ENCOUNTER — Ambulatory Visit: Payer: Medicare Other | Admitting: Physical Therapy

## 2021-01-30 DIAGNOSIS — M25512 Pain in left shoulder: Secondary | ICD-10-CM | POA: Diagnosis not present

## 2021-01-30 DIAGNOSIS — M542 Cervicalgia: Secondary | ICD-10-CM

## 2021-01-30 NOTE — Therapy (Signed)
[facility] [place], [place], [address] Phone: [phone]   Fax:  [phone]  Physical Therapy Treatment  Patient Details  Name: Michael Mcclure MRN: [id number] Date of Birth: Aug 01, 1932 Referring Provider (PT): Ronnald Collum.   Encounter Date: 01/30/2021   PT End of Session - 01/30/21 1438    Visit Number 15    Number of Visits 18    Date for PT Re-Evaluation 03/17/21    PT Start Time 0230    PT Stop Time 0313    PT Time Calculation (min) 43 min    Activity Tolerance Patient tolerated treatment well    Behavior During Therapy [facility] for tasks assessed/performed           Past Medical History:  Diagnosis Date  . Actinic keratitis 2017  . Anxiety   . Arthritis   . BPH (benign prostatic hyperplasia)   . Cataract   . GERD (gastroesophageal reflux disease)   . Hemorrhoids   . HOH (hard of hearing)   . Hx of colonic polyps   . Hyperlipidemia   . Hypothyroidism   . Keratosis, seborrheic 2017  . Pelvic fracture ([place])   . Thyroid disease     Past Surgical History:  Procedure Laterality Date  . ANKLE SURGERY    . BACK SURGERY    . CARPAL TUNNEL RELEASE Right    x2  . CATARACT EXTRACTION W/PHACO Right 05/18/2016   Procedure: CATARACT EXTRACTION PHACO AND INTRAOCULAR LENS PLACEMENT RIGHT EYE; CDE:  5.35;  Surgeon: Tonny Branch, MD;  Location: [facility];  Service: Ophthalmology;  Laterality: Right;  . ROTATOR CUFF REPAIR    . SHOULDER SURGERY Right    x 2   . WRIST SURGERY Left    fracture    There were no vitals filed for this visit.   Subjective Assessment - 01/30/21 1435    Subjective COVID-19 screen performed prior to patient entering clinic.  Patient arrived with less pain overall    Pertinent History left wrist surgery; HOH; back surgery; CTR (right); left wrist and right shoulder surgery.    Patient Stated Goals Get out of pain and sleep better.    Currently in Pain? Yes    Pain Score 3     Pain Location  Neck    Pain Orientation Left    Pain Descriptors / Indicators Discomfort    Pain Type Acute pain    Pain Onset More than a month ago    Pain Frequency Intermittent    Aggravating Factors  sleeping on left    Pain Relieving Factors rest and not laying on shoulder                             [facility] Adult PT Treatment/Exercise - 01/30/21 0001      Cryotherapy   Number Minutes Cryotherapy 15 Minutes    Cryotherapy Location Shoulder    Type of Cryotherapy Ice pack      Electrical Stimulation   Electrical Stimulation Location Left cervical.    Electrical Stimulation Action IFC    Electrical Stimulation Parameters 80-150hzz x48mn    Electrical Stimulation Goals Pain;Tone      Ultrasound   Ultrasound Location Left cervical/UT    Ultrasound Parameters combo US/ES 1.5w/cm2/175m x1236m   Ultrasound Goals Pain      Manual Therapy   Manual Therapy Soft tissue mobilization    Soft tissue mobilization manual STW to  left cervical spine/UT to reduce pain and tone                       PT Long Term Goals - 01/30/21 1436      PT LONG TERM GOAL #1   Title Pt will be independent in his HEP and progression.    Baseline issued standing hip exercises today    Time 6    Period Weeks    Status Achieved      PT LONG TERM GOAL #2   Title Eliminate left UE symptoms.    Baseline less but ongoing 01/30/21    Time 6    Period Weeks    Status On-going      PT LONG TERM GOAL #3   Title Sleep 6 hours undisturbed.    Baseline 1-2 hours if on left side 01/30/21    Time 6    Period Weeks    Status Partially Met      PT LONG TERM GOAL #4   Title Perform ADL's with pain not > 2-3/10.    Time 6    Period Weeks    Status Partially Met                  Patient will benefit from skilled therapeutic intervention in order to improve the following deficits and impairments:     Visit Diagnosis: Acute pain of left shoulder  Cervicalgia     Problem  List Patient Active Problem List   Diagnosis Date Noted  . Neural foraminal stenosis of cervical spine 01/26/2021  . Impairment of balance 08/03/2019  . Ganglion cyst of wrist, left 01/24/2019  . Osteoporosis 01/19/2017  . Osteoarthritis of hip 11/02/2016  . CKD (chronic kidney disease) stage 3, GFR 30-59 ml/min (HCC) 10/02/2016  . Venous insufficiency 05/11/2016  . Dizziness 02/07/2016  . Sensorineural hearing loss (SNHL) of both ears 10/10/2015  . Hypothyroidism 09/20/2014  . Neuropathy 02/14/2014  . Lumbar scoliosis 02/14/2014  . Anterolisthesis 02/14/2014  . GERD (gastroesophageal reflux disease) 03/19/2011  . Hyperlipidemia 03/19/2011  . Neurogenic bladder 03/19/2011  . BPH (benign prostatic hyperplasia) 03/19/2011  . Arthritis 12/06/2008  . Back pain 12/06/2008  . CARPAL TUNNEL SYNDROME, HX OF 12/06/2008    Phillips Climes, PTA 01/30/2021, 3:15 PM  [facility] [place], [place], [address] Phone: [phone]   Fax:  [phone]  Name: Michael Mcclure MRN: [id number] Date of Birth: 1932-04-29

## 2021-02-03 ENCOUNTER — Ambulatory Visit: Payer: Medicare Other | Admitting: Physical Therapy

## 2021-02-03 ENCOUNTER — Other Ambulatory Visit: Payer: Self-pay

## 2021-02-03 DIAGNOSIS — M25512 Pain in left shoulder: Secondary | ICD-10-CM

## 2021-02-03 DIAGNOSIS — M542 Cervicalgia: Secondary | ICD-10-CM

## 2021-02-03 NOTE — Therapy (Signed)
[facility] [place], [place], [address] Phone: [phone]   Fax:  [phone]  Physical Therapy Treatment  Patient Details  Name: Michael Mcclure MRN: [id number] Date of Birth: [birth year] Referring Provider (PT): Ronnald Collum.   Encounter Date: 02/03/2021   PT End of Session - 02/03/21 1142    Visit Number 16    Number of Visits 18    Date for PT Re-Evaluation 03/17/21    PT Start Time 1115    PT Stop Time 1155    PT Time Calculation (min) 40 min    Activity Tolerance Patient tolerated treatment well    Behavior During Therapy [facility] for tasks assessed/performed           Past Medical History:  Diagnosis Date  . Actinic keratitis 2017  . Anxiety   . Arthritis   . BPH (benign prostatic hyperplasia)   . Cataract   . GERD (gastroesophageal reflux disease)   . Hemorrhoids   . HOH (hard of hearing)   . Hx of colonic polyps   . Hyperlipidemia   . Hypothyroidism   . Keratosis, seborrheic 2017  . Pelvic fracture ([place])   . Thyroid disease     Past Surgical History:  Procedure Laterality Date  . ANKLE SURGERY    . BACK SURGERY    . CARPAL TUNNEL RELEASE Right    x2  . CATARACT EXTRACTION W/PHACO Right 05/18/2016   Procedure: CATARACT EXTRACTION PHACO AND INTRAOCULAR LENS PLACEMENT RIGHT EYE; CDE:  5.35;  Surgeon: Tonny Branch, MD;  Location: [facility];  Service: Ophthalmology;  Laterality: Right;  . ROTATOR CUFF REPAIR    . SHOULDER SURGERY Right    x 2   . WRIST SURGERY Left    fracture    There were no vitals filed for this visit.   Subjective Assessment - 02/03/21 1118    Subjective COVID-19 screen performed prior to patient entering clinic.  Patient reported "some" pain    Pertinent History left wrist surgery; HOH; back surgery; CTR (right); left wrist and right shoulder surgery.    Currently in Pain? Yes    Pain Score 2     Pain Location Neck    Pain Orientation Left    Pain Descriptors / Indicators  Discomfort    Pain Type Acute pain    Pain Onset More than a month ago    Pain Frequency Intermittent    Aggravating Factors  sleeping on left and activity    Pain Relieving Factors rest                             [facility] Adult PT Treatment/Exercise - 02/03/21 0001      Cryotherapy   Cryotherapy Location Shoulder    Type of Cryotherapy Ice pack      Electrical Stimulation   Electrical Stimulation Location Left cervical.    Electrical Stimulation Action IFC    Electrical Stimulation Parameters 80-150hz  x51min    Electrical Stimulation Goals Pain;Tone      Ultrasound   Ultrasound Location left cervicalUT    Ultrasound Parameters combo ES/US @ 1.5w/cm2/67mhz x82min    Ultrasound Goals Pain      Manual Therapy   Manual Therapy Soft tissue mobilization    Soft tissue mobilization manual STW to left cervical spine/UT to reduce pain and tone  PT Long Term Goals - 01/30/21 1436      PT LONG TERM GOAL #1   Title Pt will be independent in his HEP and progression.    Baseline issued standing hip exercises today    Time 6    Period Weeks    Status Achieved      PT LONG TERM GOAL #2   Title Eliminate left UE symptoms.    Baseline less but ongoing 01/30/21    Time 6    Period Weeks    Status On-going      PT LONG TERM GOAL #3   Title Sleep 6 hours undisturbed.    Baseline 1-2 hours if on left side 01/30/21    Time 6    Period Weeks    Status Partially Met      PT LONG TERM GOAL #4   Title Perform ADL's with pain not > 2-3/10.    Time 6    Period Weeks    Status Partially Met                 Plan - 02/03/21 1143    Clinical Impression Statement Patient tolerated treatment well today. Patient reported less pain today yet ongoing discomfort with sleeping on left side or prolong activity. Patient reported overall improvement. Good response upon removal of modalities. Goals progressing. Patient getting MRI on  thursday.    Personal Factors and Comorbidities Comorbidity 1;Comorbidity 2    Comorbidities Left wrist surgery; HOH; back suregry; CTR (right); left wrist and right shoulder surgery.    Examination-Activity Limitations Sleep;Other    Examination-Participation Restrictions Other    Stability/Clinical Decision Making Evolving/Moderate complexity    Rehab Potential Good    PT Treatment/Interventions ADLs/Self Care Home Management;Cryotherapy;Electrical Stimulation;Moist Heat;Traction;Ultrasound;Therapeutic exercise;Therapeutic activities;Patient/family education;Manual techniques;Passive range of motion    PT Next Visit Plan UBE, chin tucks and extension, RW4 (left shoulder), Combo e'stim/US to left C-spine/UT region, manual traction.    Consulted and Agree with Plan of Care Patient           Patient will benefit from skilled therapeutic intervention in order to improve the following deficits and impairments:  Pain,Decreased activity tolerance,Decreased range of motion,Postural dysfunction  Visit Diagnosis: Acute pain of left shoulder  Cervicalgia     Problem List Patient Active Problem List   Diagnosis Date Noted  . Neural foraminal stenosis of cervical spine 01/26/2021  . Impairment of balance 08/03/2019  . Ganglion cyst of wrist, left 01/24/2019  . Osteoporosis 01/19/2017  . Osteoarthritis of hip 11/02/2016  . CKD (chronic kidney disease) stage 3, GFR 30-59 ml/min (HCC) 10/02/2016  . Venous insufficiency 05/11/2016  . Dizziness 02/07/2016  . Sensorineural hearing loss (SNHL) of both ears 10/10/2015  . Hypothyroidism 09/20/2014  . Neuropathy 02/14/2014  . Lumbar scoliosis 02/14/2014  . Anterolisthesis 02/14/2014  . GERD (gastroesophageal reflux disease) 03/19/2011  . Hyperlipidemia 03/19/2011  . Neurogenic bladder 03/19/2011  . BPH (benign prostatic hyperplasia) 03/19/2011  . Arthritis 12/06/2008  . Back pain 12/06/2008  . CARPAL TUNNEL SYNDROME, HX OF 12/06/2008     Phillips Climes, PTA 02/03/2021, 11:55 AM  [facility] [place], [place], [address] Phone: [phone]   Fax:  [phone]  Name: Michael Mcclure MRN: [id number] Date of Birth: Jan 15, 1932

## 2021-02-04 ENCOUNTER — Ambulatory Visit: Payer: Medicare Other | Admitting: Physical Therapy

## 2021-02-04 ENCOUNTER — Other Ambulatory Visit: Payer: Self-pay

## 2021-02-06 ENCOUNTER — Ambulatory Visit: Payer: Medicare Other | Admitting: Family Medicine

## 2021-02-06 ENCOUNTER — Ambulatory Visit (HOSPITAL_COMMUNITY)
Admission: RE | Admit: 2021-02-06 | Discharge: 2021-02-06 | Disposition: A | Discharge status: Home / Self Care | Payer: Medicare Other | Source: Ambulatory Visit | Attending: Family Medicine | Admitting: Family Medicine

## 2021-02-06 ENCOUNTER — Other Ambulatory Visit: Payer: Self-pay

## 2021-02-06 DIAGNOSIS — G959 Disease of spinal cord, unspecified: Secondary | ICD-10-CM | POA: Insufficient documentation

## 2021-02-10 ENCOUNTER — Other Ambulatory Visit: Payer: Self-pay

## 2021-02-10 ENCOUNTER — Encounter: Payer: Self-pay | Admitting: Family Medicine

## 2021-02-10 ENCOUNTER — Ambulatory Visit (INDEPENDENT_AMBULATORY_CARE_PROVIDER_SITE_OTHER): Payer: No Typology Code available for payment source | Admitting: Family Medicine

## 2021-02-10 VITALS — BP 97/54 | HR 80 | Temp 97.9°F | Ht 71.0 in | Wt 190.4 lb

## 2021-02-10 DIAGNOSIS — M549 Dorsalgia, unspecified: Secondary | ICD-10-CM | POA: Diagnosis not present

## 2021-02-10 DIAGNOSIS — M4722 Other spondylosis with radiculopathy, cervical region: Secondary | ICD-10-CM | POA: Diagnosis not present

## 2021-02-10 DIAGNOSIS — M1611 Unilateral primary osteoarthritis, right hip: Secondary | ICD-10-CM | POA: Diagnosis not present

## 2021-02-10 DIAGNOSIS — M4126 Other idiopathic scoliosis, lumbar region: Secondary | ICD-10-CM | POA: Diagnosis not present

## 2021-02-10 DIAGNOSIS — G8929 Other chronic pain: Secondary | ICD-10-CM

## 2021-02-10 MED ORDER — NABUMETONE 500 MG PO TABS: 1000.0000 mg | ORAL_TABLET | Freq: Two times a day (BID) | ORAL | 5 refills | Status: DC

## 2021-02-10 NOTE — Progress Notes (Signed)
Subjective:  Patient ID: Michael Mcclure, male    DOB: 1931/12/16  Age: 87 y.o. MRN: [id number]  CC: Medical Management of Chronic Issues   HPI Michael Mcclure presents for follow-up on his neck and left shoulder pain.  The pain he describes as an 8 out of 10.  If is intermittent but present most of the time.  He says when he calms down that sometimes the pain will reduce to about a 5/10.  He would like to have a specialist evaluation.  He is currently taking gabapentin 3 times a day instead of 2 tablets 2 times a day.  He is not taking the nabumetone that was prescribed.  He finished the prednisone without significant relief.  Patient presents for follow-up on  thyroid. The patient has a history of hypothyroidism for many years. It has been stable recently. Pt. denies any change in  voice, loss of hair, heat or cold intolerance. Energy level has been adequate to good. Patient denies constipation and diarrhea. No myxedema. Medication is as noted below. Verified that pt is taking it daily on an empty stomach. Well tolerated.  Depression screen [facility] 2/9 02/10/2021 01/23/2021 12/12/2020  Decreased Interest 0 0 0  Down, Depressed, Hopeless 0 0 0  PHQ - 2 Score 0 0 0  Altered sleeping - - -  Tired, decreased energy - - -  Change in appetite - - -  Feeling bad or failure about yourself  - - -  Trouble concentrating - - -  Moving slowly or fidgety/restless - - -  Suicidal thoughts - - -  PHQ-9 Score - - -  Some recent data might be hidden    History Michael Mcclure has a past medical history of Actinic keratitis (2017), Anxiety, Arthritis, BPH (benign prostatic hyperplasia), Cataract, GERD (gastroesophageal reflux disease), Hemorrhoids, HOH (hard of hearing), colonic polyps, Hyperlipidemia, Hypothyroidism, Keratosis, seborrheic (2017), Pelvic fracture (HCC), and Thyroid disease.   He has a past surgical history that includes Back surgery; Wrist surgery (Left); Shoulder surgery (Right); Rotator cuff repair;  Carpal tunnel release (Right); Cataract extraction w/PHACO (Right, 05/18/2016); and Ankle surgery.   His family history includes Colon cancer in his mother; Colon polyps in his mother; Diabetes in his daughter and son; Emphysema in his father and son; Heart disease in his brother, brother, and father.He reports that he has never smoked. He has never used smokeless tobacco. He reports that he does not drink alcohol and does not use drugs.    ROS Review of Systems  Constitutional: Negative for fever.  Respiratory: Negative for shortness of breath.   Cardiovascular: Negative for chest pain.  Musculoskeletal: Positive for neck pain. Negative for arthralgias (Left shoulder pain.  Likely secondary to the degenerative joint disease and degenerative disc disease of the neck as noted on the MRI.).  Skin: Negative for rash.    Objective:  BP (!) 97/54   Pulse 80   Temp 97.9 F (36.6 C)   Ht 5\' 11"  (1.803 m)   Wt 190 lb 6.4 oz (86.4 kg)   SpO2 96%   BMI 26.56 kg/m   BP Readings from Last 3 Encounters:  02/10/21 (!) 97/54  01/23/21 135/77  12/12/20 118/60    Wt Readings from Last 3 Encounters:  02/10/21 190 lb 6.4 oz (86.4 kg)  01/23/21 193 lb (87.5 kg)  12/12/20 179 lb (81.2 kg)     Physical Exam Vitals reviewed.  Constitutional:      Appearance: He is well-developed and  well-nourished.  HENT:     Head: Normocephalic and atraumatic.     Right Ear: Tympanic membrane and external ear normal. No decreased hearing noted.     Left Ear: Tympanic membrane and external ear normal. No decreased hearing noted.     Mouth/Throat:     Pharynx: No oropharyngeal exudate or posterior oropharyngeal erythema.  Eyes:     Pupils: Pupils are equal, round, and reactive to light.  Cardiovascular:     Rate and Rhythm: Normal rate and regular rhythm.     Heart sounds: No murmur heard.   Pulmonary:     Effort: No respiratory distress.     Breath sounds: Normal breath sounds.  Abdominal:      General: Bowel sounds are normal.     Palpations: Abdomen is soft. There is no mass.     Tenderness: There is no abdominal tenderness.  Musculoskeletal:        General: Tenderness (At the left deltoid and biceps region.  Also some of the posterior neck.) present.     Cervical back: Normal range of motion and neck supple.       Assessment & Plan:   Michael Mcclure was seen today for medical management of chronic issues.  Diagnoses and all orders for this visit:  Cervical radiculopathy due to degenerative joint disease of spine -     Ambulatory referral to Neurosurgery -     nabumetone (RELAFEN) 500 MG tablet; Take 2 tablets (1,000 mg total) by mouth 2 (two) times daily.  Chronic midline back pain, unspecified back location -     nabumetone (RELAFEN) 500 MG tablet; Take 2 tablets (1,000 mg total) by mouth 2 (two) times daily.  Other idiopathic scoliosis, lumbar region -     nabumetone (RELAFEN) 500 MG tablet; Take 2 tablets (1,000 mg total) by mouth 2 (two) times daily.  Primary osteoarthritis of right hip -     nabumetone (RELAFEN) 500 MG tablet; Take 2 tablets (1,000 mg total) by mouth 2 (two) times daily.    I encouraged him to go ahead and increase the gabapentin to 800 mg 2 p.o. twice daily as previously prescribed in order to get relief from his neurogenic pain.   I have discontinued Michael Mcclure. Michael Mcclure's predniSONE. I have also changed his nabumetone. Additionally, I am having him maintain his cholecalciferol, Flax Seed Oil, multivitamin with minerals, Xiidra, Spacer/Aero-Holding Chambers, albuterol, alendronate, diclofenac, ezetimibe-simvastatin, triamcinolone ointment, tamsulosin, pantoprazole, citalopram, levothyroxine, gabapentin, and clopidogrel.  Allergies as of 02/10/2021      Reactions   Hydrocodone Shortness Of Breath   Ciprofloxacin Other (See Comments)   Question caused short of breath.      Medication List       Accurate as of February 10, 2021  6:29 PM. If you have any  questions, ask your nurse or doctor.        STOP taking these medications   predniSONE 10 MG tablet Commonly known as: DELTASONE Stopped by: Michael Claude, MD     TAKE these medications   albuterol 108 (90 Base) MCG/ACT inhaler Commonly known as: VENTOLIN HFA TAKE 2 PUFFS BY MOUTH EVERY 6 HOURS AS NEEDED FOR WHEEZE OR SHORTNESS OF BREATH   alendronate 70 MG tablet Commonly known as: FOSAMAX TAKE 1 TABLET BY MOUTH ONCE A WEEK WITH A FULL GLASS OF WATER ON AN EMPTY STOMACH.   cholecalciferol 1000 units tablet Commonly known as: VITAMIN D Take 1,000 Units by mouth daily.   citalopram 20 MG tablet  Commonly known as: CELEXA TAKE 1 TABLET BY MOUTH EVERY DAY   clopidogrel 75 MG tablet Commonly known as: PLAVIX TAKE 1 TABLET BY MOUTH EVERY DAY   diclofenac 1.3 % Ptch Commonly known as: Flector Place 1 patch onto the skin 2 (two) times daily.   ezetimibe-simvastatin 10-40 MG tablet Commonly known as: VYTORIN Take 1 tablet by mouth daily at 6 PM.   Flax Seed Oil 1000 MG Caps Take 1 capsule by mouth daily.   gabapentin 800 MG tablet Commonly known as: NEURONTIN TAKE 2 TABLETS BY MOUTH 2 TIMES DAILY.   levothyroxine 25 MCG tablet Commonly known as: SYNTHROID TAKE 1 TABLET BY MOUTH EVERY DAY   multivitamin with minerals tablet Take 1 tablet by mouth daily.   nabumetone 500 MG tablet Commonly known as: RELAFEN Take 2 tablets (1,000 mg total) by mouth 2 (two) times daily. What changed: See the new instructions. Changed by: Michael Claude, MD   pantoprazole 40 MG tablet Commonly known as: PROTONIX TAKE 1 TABLET BY MOUTH EVERY DAY   Spacer/Aero-Holding Rudean Curt 1 Device by Does not apply route as needed.   tamsulosin 0.4 MG Caps capsule Commonly known as: FLOMAX TAKE 2 CAPSULES (0.8 MG TOTAL) BY MOUTH AT BEDTIME.   triamcinolone ointment 0.5 % Commonly known as: KENALOG APPLY TOPICALLY TWICE DAILY AS NEEDED   Xiidra 5 % Soln Generic drug: Lifitegrast         Follow-up: Return in about 6 months (around 08/13/2021), or if symptoms worsen or fail to improve.  Michael Mcclure, M.D.

## 2021-02-14 ENCOUNTER — Other Ambulatory Visit: Payer: Self-pay | Admitting: Family Medicine

## 2021-02-14 DIAGNOSIS — E785 Hyperlipidemia, unspecified: Secondary | ICD-10-CM

## 2021-03-06 ENCOUNTER — Other Ambulatory Visit: Payer: Self-pay | Admitting: Family Medicine

## 2021-03-06 DIAGNOSIS — M25531 Pain in right wrist: Secondary | ICD-10-CM

## 2021-03-06 DIAGNOSIS — M1990 Unspecified osteoarthritis, unspecified site: Secondary | ICD-10-CM

## 2021-03-06 DIAGNOSIS — M25551 Pain in right hip: Secondary | ICD-10-CM

## 2021-03-06 DIAGNOSIS — K219 Gastro-esophageal reflux disease without esophagitis: Secondary | ICD-10-CM

## 2021-03-06 DIAGNOSIS — W19XXXA Unspecified fall, initial encounter: Secondary | ICD-10-CM

## 2021-03-06 DIAGNOSIS — M25562 Pain in left knee: Secondary | ICD-10-CM

## 2021-03-11 ENCOUNTER — Encounter: Payer: Self-pay | Admitting: Family Medicine

## 2021-03-11 ENCOUNTER — Ambulatory Visit (INDEPENDENT_AMBULATORY_CARE_PROVIDER_SITE_OTHER): Payer: No Typology Code available for payment source | Admitting: Family Medicine

## 2021-03-11 ENCOUNTER — Other Ambulatory Visit: Payer: Self-pay

## 2021-03-11 VITALS — BP 134/62 | HR 78 | Temp 97.8°F | Ht 71.0 in | Wt 194.6 lb

## 2021-03-11 DIAGNOSIS — E039 Hypothyroidism, unspecified: Secondary | ICD-10-CM | POA: Diagnosis not present

## 2021-03-11 DIAGNOSIS — N401 Enlarged prostate with lower urinary tract symptoms: Secondary | ICD-10-CM

## 2021-03-11 DIAGNOSIS — N183 Chronic kidney disease, stage 3 unspecified: Secondary | ICD-10-CM | POA: Diagnosis not present

## 2021-03-11 DIAGNOSIS — Z1211 Encounter for screening for malignant neoplasm of colon: Secondary | ICD-10-CM

## 2021-03-11 DIAGNOSIS — Z125 Encounter for screening for malignant neoplasm of prostate: Secondary | ICD-10-CM

## 2021-03-11 DIAGNOSIS — M4126 Other idiopathic scoliosis, lumbar region: Secondary | ICD-10-CM

## 2021-03-11 DIAGNOSIS — M4310 Spondylolisthesis, site unspecified: Secondary | ICD-10-CM

## 2021-03-11 DIAGNOSIS — R3911 Hesitancy of micturition: Secondary | ICD-10-CM

## 2021-03-11 MED ORDER — CETIRIZINE HCL 10 MG PO TABS: 10.0000 mg | ORAL_TABLET | Freq: Every day | ORAL | 3 refills | Status: AC

## 2021-03-11 NOTE — Progress Notes (Signed)
Subjective:  Patient ID: Michael Mcclure, male    DOB: 1932-03-04  Age: 87 y.o. MRN: [id number]  CC: Follow-up (Left shoulder pain)   HPI Michael Mcclure presents for improvement in shoulder pain, but wobbly at times. Taking the gaba 1600 mg TID & the nabumetone 1 gm TID for the last week., Also drinking a lot of water and urinating frequently. Some hesitancy. Has appointment with neurosurgery soon. Wants referral to urology to check out his prostate..   Depression screen [facility] 2/9 03/11/2021 02/10/2021 01/23/2021  Decreased Interest 0 0 0  Down, Depressed, Hopeless 0 0 0  PHQ - 2 Score 0 0 0  Altered sleeping - - -  Tired, decreased energy - - -  Change in appetite - - -  Feeling bad or failure about yourself  - - -  Trouble concentrating - - -  Moving slowly or fidgety/restless - - -  Suicidal thoughts - - -  PHQ-9 Score - - -  Some recent data might be hidden    History [name] has a past medical history of Actinic keratitis (2017), Anxiety, Arthritis, BPH (benign prostatic hyperplasia), CARPAL TUNNEL SYNDROME, HX OF (12/06/2008), Cataract, GERD (gastroesophageal reflux disease), Hemorrhoids, HOH (hard of hearing), colonic polyps, Hyperlipidemia, Hypothyroidism, Keratosis, seborrheic (2017), Pelvic fracture ([place]), and Thyroid disease.   He has a past surgical history that includes Back surgery; Wrist surgery (Left); Shoulder surgery (Right); Rotator cuff repair; Carpal tunnel release (Right); Cataract extraction w/PHACO (Right, 05/18/2016); and Ankle surgery.   His family history includes Colon cancer in his mother; Colon polyps in his mother; Diabetes in his daughter and son; Emphysema in his father and son; Heart disease in his brother, brother, and father.He reports that he has never smoked. He has never used smokeless tobacco. He reports that he does not drink alcohol and does not use drugs.    ROS Review of Systems  Constitutional: Negative for fever.  Respiratory: Negative for  shortness of breath.   Cardiovascular: Negative for chest pain.  Genitourinary: Positive for frequency.  Musculoskeletal: Positive for arthralgias and neck pain.  Skin: Negative for rash.    Objective:  BP 134/62   Pulse 78   Temp 97.8 F (36.6 C)   Ht $R'5\' 11"'eK$  (1.803 m)   Wt 194 lb 9.6 oz (88.3 kg)   SpO2 96%   BMI 27.14 kg/m   BP Readings from Last 3 Encounters:  03/11/21 134/62  02/10/21 (!) 97/54  01/23/21 135/77    Wt Readings from Last 3 Encounters:  03/11/21 194 lb 9.6 oz (88.3 kg)  02/10/21 190 lb 6.4 oz (86.4 kg)  01/23/21 193 lb (87.5 kg)     Physical Exam Vitals reviewed.  Constitutional:      Appearance: He is well-developed.  HENT:     Head: Normocephalic and atraumatic.     Right Ear: Tympanic membrane and external ear normal. No decreased hearing noted.     Left Ear: Tympanic membrane and external ear normal. No decreased hearing noted.     Mouth/Throat:     Pharynx: No oropharyngeal exudate or posterior oropharyngeal erythema.  Eyes:     Pupils: Pupils are equal, round, and reactive to light.  Cardiovascular:     Rate and Rhythm: Normal rate and regular rhythm.     Heart sounds: No murmur heard.   Pulmonary:     Effort: No respiratory distress.     Breath sounds: Normal breath sounds.  Abdominal:     General:  Bowel sounds are normal.     Palpations: Abdomen is soft. There is no mass.     Tenderness: There is no abdominal tenderness.  Musculoskeletal:        General: Tenderness (left lateral neck and the superior left trapezius) present.     Cervical back: Normal range of motion and neck supple.       Assessment & Plan:   Michael Mcclure was seen today for follow-up.  Diagnoses and all orders for this visit:  Anterolisthesis  Stage 3 chronic kidney disease, unspecified whether stage 3a or 3b CKD ([place]) -     CMP14+EGFR -     CBC with Differential/Platelet  Acquired hypothyroidism -     TSH + free T4 -     CMP14+EGFR -     CBC with  Differential/Platelet  Other idiopathic scoliosis, lumbar region  Screening for prostate cancer -     PSA, total and free  Screen for colon cancer  Benign prostatic hyperplasia with urinary hesitancy -     PSA, total and free -     Ambulatory referral to Urology  Other orders -     cetirizine (ZYRTEC) 10 MG tablet; Take 1 tablet (10 mg total) by mouth daily. For allergy symptoms       I am having Michael Mcclure. Huish start on cetirizine. I am also having him maintain his cholecalciferol, Flax Seed Oil, multivitamin with minerals, Xiidra, Spacer/Aero-Holding Chambers, albuterol, alendronate, diclofenac, tamsulosin, citalopram, levothyroxine, gabapentin, nabumetone, ezetimibe-simvastatin, clopidogrel, triamcinolone ointment, pantoprazole, and Omega-3 Fatty Acids (FISH OIL PO).  Allergies as of 03/11/2021      Reactions   Hydrocodone Shortness Of Breath   Ciprofloxacin Other (See Comments)   Question caused short of breath.      Medication List       Accurate as of March 11, 2021  6:42 PM. If you have any questions, ask your nurse or doctor.        albuterol 108 (90 Base) MCG/ACT inhaler Commonly known as: VENTOLIN HFA TAKE 2 PUFFS BY MOUTH EVERY 6 HOURS AS NEEDED FOR WHEEZE OR SHORTNESS OF BREATH   alendronate 70 MG tablet Commonly known as: FOSAMAX TAKE 1 TABLET BY MOUTH ONCE A WEEK WITH A FULL GLASS OF WATER ON AN EMPTY STOMACH.   cetirizine 10 MG tablet Commonly known as: ZYRTEC Take 1 tablet (10 mg total) by mouth daily. For allergy symptoms Started by: Claretta Fraise, MD   cholecalciferol 1000 units tablet Commonly known as: VITAMIN D Take 1,000 Units by mouth daily.   citalopram 20 MG tablet Commonly known as: CELEXA TAKE 1 TABLET BY MOUTH EVERY DAY   clopidogrel 75 MG tablet Commonly known as: PLAVIX TAKE 1 TABLET BY MOUTH EVERY DAY   diclofenac 1.3 % Ptch Commonly known as: Flector Place 1 patch onto the skin 2 (two) times daily.    ezetimibe-simvastatin 10-40 MG tablet Commonly known as: VYTORIN TAKE 1 TABLET BY MOUTH DAILY AT 6 PM.   FISH OIL PO Take by mouth.   Flax Seed Oil 1000 MG Caps Take 1 capsule by mouth daily.   gabapentin 800 MG tablet Commonly known as: NEURONTIN TAKE 2 TABLETS BY MOUTH 2 TIMES DAILY.   levothyroxine 25 MCG tablet Commonly known as: SYNTHROID TAKE 1 TABLET BY MOUTH EVERY DAY   multivitamin with minerals tablet Take 1 tablet by mouth daily.   nabumetone 500 MG tablet Commonly known as: RELAFEN Take 2 tablets (1,000 mg total) by mouth 2 (two) times  daily.   pantoprazole 40 MG tablet Commonly known as: PROTONIX TAKE 1 TABLET BY MOUTH EVERY DAY   Spacer/Aero-Holding Chambers Devi 1 Device by Does not apply route as needed.   tamsulosin 0.4 MG Caps capsule Commonly known as: FLOMAX TAKE 2 CAPSULES (0.8 MG TOTAL) BY MOUTH AT BEDTIME.   triamcinolone ointment 0.5 % Commonly known as: KENALOG APPLY TOPICALLY TWICE DAILY AS NEEDED   Xiidra 5 % Soln Generic drug: Lifitegrast        Follow-up: Return in about 3 months (around 06/10/2021).  Claretta Fraise, M.D.

## 2021-03-12 ENCOUNTER — Ambulatory Visit (INDEPENDENT_AMBULATORY_CARE_PROVIDER_SITE_OTHER): Payer: No Typology Code available for payment source

## 2021-03-12 ENCOUNTER — Other Ambulatory Visit: Payer: Self-pay | Admitting: Family Medicine

## 2021-03-12 VITALS — Ht 71.0 in | Wt 190.0 lb

## 2021-03-12 DIAGNOSIS — Z0000 Encounter for general adult medical examination without abnormal findings: Secondary | ICD-10-CM | POA: Diagnosis not present

## 2021-03-12 DIAGNOSIS — F419 Anxiety disorder, unspecified: Secondary | ICD-10-CM

## 2021-03-12 LAB — CBC WITH DIFFERENTIAL/PLATELET
Basophils Absolute: 0 10*3/uL (ref 0.0–0.2)
Basos: 1 %
EOS (ABSOLUTE): 0.2 10*3/uL (ref 0.0–0.4)
Eos: 4 %
Hematocrit: 38 % (ref 37.5–51.0)
Hemoglobin: 13 g/dL (ref 13.0–17.7)
Immature Grans (Abs): 0 10*3/uL (ref 0.0–0.1)
Immature Granulocytes: 0 %
Lymphocytes Absolute: 1.1 10*3/uL (ref 0.7–3.1)
Lymphs: 27 %
MCH: 31.6 pg (ref 26.6–33.0)
MCHC: 34.2 g/dL (ref 31.5–35.7)
MCV: 93 fL (ref 79–97)
Monocytes Absolute: 0.4 10*3/uL (ref 0.1–0.9)
Monocytes: 11 %
Neutrophils Absolute: 2.3 10*3/uL (ref 1.4–7.0)
Neutrophils: 57 %
Platelets: 162 10*3/uL (ref 150–450)
RBC: 4.11 x10E6/uL — ABNORMAL LOW (ref 4.14–5.80)
RDW: 12.2 % (ref 11.6–15.4)
WBC: 4 10*3/uL (ref 3.4–10.8)

## 2021-03-12 LAB — CMP14+EGFR
ALT: 18 IU/L (ref 0–44)
AST: 32 IU/L (ref 0–40)
Albumin/Globulin Ratio: 2 (ref 1.2–2.2)
Albumin: 4.2 g/dL (ref 3.6–4.6)
Alkaline Phosphatase: 61 IU/L (ref 44–121)
BUN/Creatinine Ratio: 15 (ref 10–24)
BUN: 21 mg/dL (ref 8–27)
Bilirubin Total: 0.5 mg/dL (ref 0.0–1.2)
CO2: 22 mmol/L (ref 20–29)
Calcium: 9.6 mg/dL (ref 8.6–10.2)
Chloride: 104 mmol/L (ref 96–106)
Creatinine, Ser: 1.44 mg/dL — ABNORMAL HIGH (ref 0.76–1.27)
Globulin, Total: 2.1 g/dL (ref 1.5–4.5)
Glucose: 130 mg/dL — ABNORMAL HIGH (ref 65–99)
Potassium: 5 mmol/L (ref 3.5–5.2)
Sodium: 141 mmol/L (ref 134–144)
Total Protein: 6.3 g/dL (ref 6.0–8.5)
eGFR: 47 mL/min/{1.73_m2} — ABNORMAL LOW (ref >59)

## 2021-03-12 LAB — PSA, TOTAL AND FREE
PSA, Free Pct: 39 %
PSA, Free: 0.39 ng/mL
Prostate Specific Ag, Serum: 1 ng/mL (ref 0.0–4.0)

## 2021-03-12 LAB — TSH+FREE T4
Free T4: 1.47 ng/dL (ref 0.82–1.77)
TSH: 3.58 u[IU]/mL (ref 0.450–4.500)

## 2021-03-12 NOTE — Progress Notes (Signed)
Hello Kemo,  Your lab result is normal and/or stable.Some minor variations that are not significant are commonly marked abnormal, but do not represent any medical problem for you.  Best regards, Malahki Gasaway, M.D.

## 2021-03-12 NOTE — Progress Notes (Signed)
Subjective:   Michael Mcclure is a 87 y.o. male who presents for Medicare Annual/Subsequent preventive examination.  Virtual Visit via Telephone Note  I connected with  Michael Mcclure on 03/12/21 at  9:00 AM EDT by telephone and verified that I am speaking with the correct person using two identifiers.  Location: Patient: Home Provider: [facility] Persons participating in the virtual visit: patient/Nurse Health Advisor   I discussed the limitations, risks, security and privacy concerns of performing an evaluation and management service by telephone and the availability of in person appointments. The patient expressed understanding and agreed to proceed.  Interactive audio and video telecommunications were attempted between this nurse and patient, however failed, due to patient having technical difficulties OR patient did not have access to video capability.  We continued and completed visit with audio only.  Some vital signs may be absent or patient reported.   Michael Mcclure E [name], LPN   Review of Systems     Cardiac Risk Factors include: advanced age (>69men, >65 women);male gender;dyslipidemia     Objective:    Today's Vitals   03/12/21 0841 03/12/21 0842  Weight: 190 lb (86.2 kg)   Height: 5\' 11"  (1.803 m)   PainSc: 2  2    Body mass index is 26.5 kg/m.  Advanced Directives 03/12/2021 02/02/2020 08/10/2019 02/03/2018 01/24/2018 01/19/2017 05/18/2016  Does Patient Have a Medical Advance Directive? Yes No No Yes Yes Yes Yes  Type of 07/18/2016 of [place];Living will - - [place] of Public affairs consultant Power of [employer] Power of [employer] Power of [place];Living will  Does patient want to make changes to medical advance directive? - - - - - No - Patient declined No - Patient declined  Copy of Healthcare Power of Attorney in Chart? No - copy requested - - - No - copy requested No - copy requested No - copy requested  Would patient like  information on creating a medical advance directive? - Yes (MAU/Ambulatory/Procedural Areas - Information given) No - Patient declined - - - -    Current Medications (verified) Outpatient Encounter Medications as of 03/12/2021  Medication Sig  . albuterol (VENTOLIN HFA) 108 (90 Base) MCG/ACT inhaler TAKE 2 PUFFS BY MOUTH EVERY 6 HOURS AS NEEDED FOR WHEEZE OR SHORTNESS OF BREATH  . alendronate (FOSAMAX) 70 MG tablet TAKE 1 TABLET BY MOUTH ONCE A WEEK WITH A FULL GLASS OF WATER ON AN EMPTY STOMACH.  . cetirizine (ZYRTEC) 10 MG tablet Take 1 tablet (10 mg total) by mouth daily. For allergy symptoms  . cholecalciferol (VITAMIN D) 1000 units tablet Take 1,000 Units by mouth daily.   . citalopram (CELEXA) 20 MG tablet TAKE 1 TABLET BY MOUTH EVERY DAY  . clopidogrel (PLAVIX) 75 MG tablet TAKE 1 TABLET BY MOUTH EVERY DAY  . diclofenac (FLECTOR) 1.3 % PTCH Place 1 patch onto the skin 2 (two) times daily.  05/12/2021 ezetimibe-simvastatin (VYTORIN) 10-40 MG tablet TAKE 1 TABLET BY MOUTH DAILY AT 6 PM.  . Flaxseed, Linseed, (FLAX SEED OIL) 1000 MG CAPS Take 1 capsule by mouth daily.   Marland Kitchen gabapentin (NEURONTIN) 800 MG tablet TAKE 2 TABLETS BY MOUTH 2 TIMES DAILY.  Marland Kitchen levothyroxine (SYNTHROID) 25 MCG tablet TAKE 1 TABLET BY MOUTH EVERY DAY  . Multiple Vitamins-Minerals (MULTIVITAMIN WITH MINERALS) tablet Take 1 tablet by mouth daily.  . nabumetone (RELAFEN) 500 MG tablet Take 2 tablets (1,000 mg total) by mouth 2 (two) times daily.  . Omega-3 Fatty  Acids (FISH OIL PO) Take by mouth.  . pantoprazole (PROTONIX) 40 MG tablet TAKE 1 TABLET BY MOUTH EVERY DAY  . Spacer/Aero-Holding Chambers DEVI 1 Device by Does not apply route as needed.  . tamsulosin (FLOMAX) 0.4 MG CAPS capsule TAKE 2 CAPSULES (0.8 MG TOTAL) BY MOUTH AT BEDTIME.  Marland Kitchen. triamcinolone ointment (KENALOG) 0.5 % APPLY TOPICALLY TWICE DAILY AS NEEDED  . XIIDRA 5 % SOLN    No facility-administered encounter medications on file as of 03/12/2021.    Allergies  (verified) Hydrocodone and Ciprofloxacin   History: Past Medical History:  Diagnosis Date  . Actinic keratitis 2017  . Anxiety   . Arthritis   . BPH (benign prostatic hyperplasia)   . CARPAL TUNNEL SYNDROME, HX OF 12/06/2008   Qualifier: Diagnosis of  By: Hart RochesterLawson LPN, Raynelle FanningJulie    . Cataract   . GERD (gastroesophageal reflux disease)   . Hemorrhoids   . HOH (hard of hearing)   . Hx of colonic polyps   . Hyperlipidemia   . Hypothyroidism   . Keratosis, seborrheic 2017  . Pelvic fracture (HCC)   . Thyroid disease    Past Surgical History:  Procedure Laterality Date  . ANKLE SURGERY    . BACK SURGERY    . CARPAL TUNNEL RELEASE Right    x2  . CATARACT EXTRACTION W/PHACO Right 05/18/2016   Procedure: CATARACT EXTRACTION PHACO AND INTRAOCULAR LENS PLACEMENT RIGHT EYE; CDE:  5.35;  Surgeon: Gemma PayorKerry Hunt, MD;  Location: [facility];  Service: Ophthalmology;  Laterality: Right;  . ROTATOR CUFF REPAIR    . SHOULDER SURGERY Right    x 2   . WRIST SURGERY Left    fracture   Family History  Problem Relation Age of Onset  . Colon cancer Mother   . Colon polyps Mother        ?  . Diabetes Daughter   . Heart disease Father        No details  . Emphysema Father        black lung / coal miner  . Heart disease Brother        Died during bypass age about 2960.  . Diabetes Son   . Emphysema Son        black lung  . Heart disease Brother        Unclear age of onset.  CAD.     Social History   Socioeconomic History  . Marital status: Divorced    Spouse name: Not on file  . Number of children: 4  . Years of education: 8  . Highest education level: 8th grade  Occupational History  . Occupation: Forensic scientistCoal Miner    Comment: Retired  Tobacco Use  . Smoking status: Never Smoker  . Smokeless tobacco: Never Used  Vaping Use  . Vaping Use: Never used  Substance and Sexual Activity  . Alcohol use: No  . Drug use: No  . Sexual activity: Never    Birth control/protection: None  Other Topics  Concern  . Not on file  Social History Narrative   Caffeine  Daily    Divorced, lives with son. They have one dog. Pt drinks one cup of coffee a day, 1-2 decaff sodas a week. Was previously getting regular exercise walking and riding stationary bicycle, however has had pain in his right groin area that has prevented him from this the last 2 months. I questioned if anyone had examined the groin area, he states his PCP did and  said it was arthritis.   Social Determinants of Health   Financial Resource Strain: Low Risk   . Difficulty of Paying Living Expenses: Not hard at all  Food Insecurity: No Food Insecurity  . Worried About Programme researcher, broadcasting/film/video in the Last Year: Never true  . Ran Out of Food in the Last Year: Never true  Transportation Needs: No Transportation Needs  . Lack of Transportation (Medical): No  . Lack of Transportation (Non-Medical): No  Physical Activity: Sufficiently Active  . Days of Exercise per Week: 7 days  . Minutes of Exercise per Session: 30 min  Stress: No Stress Concern Present  . Feeling of Stress : Only a little  Social Connections: Moderately Integrated  . Frequency of Communication with Friends and Family: More than three times a week  . Frequency of Social Gatherings with Friends and Family: Once a week  . Attends Religious Services: More than 4 times per year  . Active Member of Clubs or Organizations: Yes  . Attends Banker Meetings: More than 4 times per year  . Marital Status: Divorced    Tobacco Counseling Counseling given: Not Answered   Clinical Intake:  Pre-visit preparation completed: Yes  Pain : 0-10 Pain Score: 2  Pain Type: Chronic pain Pain Location: Shoulder Pain Orientation: Left Pain Descriptors / Indicators: Aching Pain Onset: More than a month ago Pain Frequency: Intermittent     BMI - recorded: 26.5 Nutritional Status: BMI 25 -29 Overweight Nutritional Risks: None Diabetes: No  How often do you need to  have someone help you when you read instructions, pamphlets, or other written materials from your doctor or pharmacy?: 1 - Never  Diabetic? No  Interpreter Needed?: No  Information entered by :: Chaya Dehaan, LPN   Activities of Daily Living In your present state of health, do you have any difficulty performing the following activities: 03/12/2021  Hearing? Y  Comment Hearing aids don't help much - was told eventually he will lose all hearing  Vision? N  Difficulty concentrating or making decisions? N  Walking or climbing stairs? Y  Comment He can do this slowly holding on to rails  Dressing or bathing? N  Doing errands, shopping? N  Preparing Food and eating ? N  Using the Toilet? N  In the past six months, have you accidently leaked urine? N  Do you have problems with loss of bowel control? N  Managing your Medications? N  Managing your Finances? N  Housekeeping or managing your Housekeeping? N  Some recent data might be hidden    Patient Care Team: Mechele Claude, MD as PCP - General (Family Medicine) Rollene Rotunda, MD as Consulting Physician (Cardiology) Marcelino Duster, MD as Consulting Physician (Dermatology)  Indicate any recent Medical Services you may have received from other than [facility] providers in the past year (date may be approximate).     Assessment:   This is a routine wellness examination for [place].  Hearing/Vision screen  Hearing Screening             Right ear:           Left ear:           Comments: Severe hearing loss - Says he was told over time he would lose his hearing completely - hearing aids don't help much.  Vision Screening Comments: Wears glasses - Hasn't seen eye doctor in years.  Dietary issues and exercise activities discussed: Current Exercise Habits: Home exercise routine, Type  of exercise: walking, Time (Minutes): 30, Frequency (Times/Week): 7, Weekly Exercise (Minutes/Week): 210,  Intensity: Mild, Exercise limited by: orthopedic condition(s)  Goals    . Exercise 150 min/wk Moderate Activity    . LIFESTYLE - DECREASE FALLS RISK      Depression Screen PHQ 2/9 Scores 03/12/2021 03/11/2021 02/10/2021 01/23/2021 12/12/2020 08/09/2020 04/17/2020  PHQ - 2 Score 0 0 0 0 0 0 0  PHQ- 9 Score - - - - - - -    Fall Risk Fall Risk  03/12/2021 03/11/2021 02/10/2021 01/23/2021 12/12/2020  Falls in the past year? 1 1 1  0 0  Comment - - - - -  Number falls in past yr: 1 1 0 - -  Comment tripped outdoors once, slipped up in bathtub once - no injury - he was able to get himself up - - - -  Injury with Fall? 0 0 0 - -  Comment - - - - -  Risk for fall due to : History of fall(s);Impaired balance/gait;Orthopedic patient;Impaired vision;Medication side effect History of fall(s) History of fall(s) - -  Follow up Falls prevention discussed;Education provided Falls evaluation completed Falls evaluation completed Falls evaluation completed -    FALL RISK PREVENTION PERTAINING TO THE HOME:  Any stairs in or around the home? Yes  If so, are there any without handrails? No  Home free of loose throw rugs in walkways, pet beds, electrical cords, etc? Yes  Adequate lighting in your home to reduce risk of falls? Yes   ASSISTIVE DEVICES UTILIZED TO PREVENT FALLS:  Life alert? No  Use of a cane, walker or w/c? Yes  Grab bars in the bathroom? Yes  Shower chair or bench in shower? No  Elevated toilet seat or a handicapped toilet? No   TIMED UP AND GO:  Was the test performed? No . Telephonic visit.  Cognitive Function: MMSE - Mini Mental State Exam 01/24/2018 01/19/2017  Orientation to time 5 5  Orientation to Place 5 5  Registration 3 -  Attention/ Calculation 5 5  Recall 1 -  Language- name 2 objects 2 -  Language- repeat 0 -  Language- follow 3 step command 3 -  Language- read & follow direction 1 -  Write a sentence 1 -  Copy design 1 -  Total score 27 -     6CIT Screen 02/02/2020  What  Year? 0 points  What month? 0 points  What time? 0 points  Count back from 20 0 points  Months in reverse 0 points  Repeat phrase 0 points  Total Score 0    Immunizations Immunization History  Administered Date(s) Administered  . Influenza Split 08/07/2010  . Influenza, High Dose Seasonal PF 10/02/2016, 10/08/2017, 08/31/2018, 08/28/2019, 08/28/2019, 08/23/2020  . Influenza,inj,Quad PF,6+ Mos 09/05/2015  . Influenza-Unspecified 09/05/2015, 08/15/2020  . Moderna Sars-Covid-2 Vaccination 02/12/2020, 03/11/2020, 11/12/2020  . Pneumococcal Conjugate-13 09/05/2015  . Pneumococcal Polysaccharide-23 01/24/2018  . Tdap 11/26/2015  . Zoster 11/26/2015    TDAP status: Up to date  Flu Vaccine status: Up to date  Pneumococcal vaccine status: Up to date  Covid-19 vaccine status: Completed vaccines  Qualifies for Shingles Vaccine? Yes   Zostavax completed Yes   Shingrix Completed?: No.    Education has been provided regarding the importance of this vaccine. Patient has been advised to call insurance company to determine out of pocket expense if they have not yet received this vaccine. Advised may also receive vaccine at local pharmacy or Health Dept. Verbalized  acceptance and understanding.  Screening Tests Health Maintenance  Topic Date Due  . DEXA SCAN  01/19/2019  . INFLUENZA VACCINE  07/07/2021  . TETANUS/TDAP  11/25/2025  . COVID-19 Vaccine  Completed  . PNA vac Low Risk Adult  Completed  . HPV VACCINES  Aged Out    Health Maintenance  Health Maintenance Due  Topic Date Due  . DEXA SCAN  01/19/2019    Colorectal cancer screening: No longer required.   Lung Cancer Screening: (Low Dose CT Chest recommended if Age 62-80 years, 30 pack-year currently smoking OR have quit w/in 15years.) does not qualify.   Additional Screening:  Hepatitis C Screening: does not qualify  Vision Screening: Recommended annual ophthalmology exams for early detection of glaucoma and other  disorders of the eye. Is the patient up to date with their annual eye exam?  No  Who is the provider or what is the name of the office in which the patient attends annual eye exams? Needs to establish care with new provider. If pt is not established with a provider, would they like to be referred to a provider to establish care? No .   Dental Screening: Recommended annual dental exams for proper oral hygiene  Community Resource Referral / Chronic Care Management: CRR required this visit?  No   CCM required this visit?  No      Plan:     I have personally reviewed and noted the following in the patient's chart:   . Medical and social history . Use of alcohol, tobacco or illicit drugs  . Current medications and supplements . Functional ability and status . Nutritional status . Physical activity . Advanced directives . List of other physicians . Hospitalizations, surgeries, and ER visits in previous 12 months . Vitals . Screenings to include cognitive, depression, and falls . Referrals and appointments  In addition, I have reviewed and discussed with patient certain preventive protocols, quality metrics, and best practice recommendations. A written personalized care plan for preventive services as well as general preventive health recommendations were provided to patient.     [place] Constable, LPN   03/08/6833   Nurse Notes: None

## 2021-03-12 NOTE — Patient Instructions (Signed)
Michael Mcclure , Thank you for taking time to come for your Medicare Wellness Visit. I appreciate your ongoing commitment to your health goals. Please review the following plan we discussed and let me know if I can assist you in the future.   Screening recommendations/referrals: Colonoscopy: Done 04/28/2012; At home Cologuard test was ordered 11/2020 - Need to return this. Recommended yearly ophthalmology/optometry visit for glaucoma screening and checkup Recommended yearly dental visit for hygiene and checkup  Vaccinations: Influenza vaccine: Done 08/23/2020 - Repeat every year Pneumococcal vaccine: Done 09/05/2015 & 01/24/2018 - No repeat Tdap vaccine: Done 11/26/2015 - Repeat in 10 years Shingles vaccine: Zostavax done 2016; Shingrix discussed. Please contact your pharmacy for coverage information.    Covid-19: Complete: 02/12/20, 03/11/20, & 11/12/2020  Advanced directives: Please bring a copy of your health care power of attorney and living will to the office to be added to your chart at your convenience.  Conditions/risks identified: Continue to work on fall prevention; try balance and strength exercises.  Next appointment: Follow up in one year for your annual wellness visit.   Preventive Care 25 Years and Older, Male  Preventive care refers to lifestyle choices and visits with your health care provider that can promote health and wellness. What does preventive care include?  A yearly physical exam. This is also called an annual well check.  Dental exams once or twice a year.  Routine eye exams. Ask your health care provider how often you should have your eyes checked.  Personal lifestyle choices, including:  Daily care of your teeth and gums.  Regular physical activity.  Eating a healthy diet.  Avoiding tobacco and drug use.  Limiting alcohol use.  Practicing safe sex.  Taking low doses of aspirin every day.  Taking vitamin and mineral supplements as recommended by your  health care provider. What happens during an annual well check? The services and screenings done by your health care provider during your annual well check will depend on your age, overall health, lifestyle risk factors, and family history of disease. Counseling  Your health care provider may ask you questions about your:  Alcohol use.  Tobacco use.  Drug use.  Emotional well-being.  Home and relationship well-being.  Sexual activity.  Eating habits.  History of falls.  Memory and ability to understand (cognition).  Work and work Astronomer. Screening  You may have the following tests or measurements:  Height, weight, and BMI.  Blood pressure.  Lipid and cholesterol levels. These may be checked every 5 years, or more frequently if you are over 24 years old.  Skin check.  Lung cancer screening. You may have this screening every year starting at age 54 if you have a 30-pack-year history of smoking and currently smoke or have quit within the past 15 years.  Fecal occult blood test (FOBT) of the stool. You may have this test every year starting at age 56.  Flexible sigmoidoscopy or colonoscopy. You may have a sigmoidoscopy every 5 years or a colonoscopy every 10 years starting at age 12.  Prostate cancer screening. Recommendations will vary depending on your family history and other risks.  Hepatitis C blood test.  Hepatitis B blood test.  Sexually transmitted disease (STD) testing.  Diabetes screening. This is done by checking your blood sugar (glucose) after you have not eaten for a while (fasting). You may have this done every 1-3 years.  Abdominal aortic aneurysm (AAA) screening. You may need this if you are a current or  former smoker.  Osteoporosis. You may be screened starting at age 63 if you are at high risk. Talk with your health care provider about your test results, treatment options, and if necessary, the need for more tests. Vaccines  Your health  care provider may recommend certain vaccines, such as:  Influenza vaccine. This is recommended every year.  Tetanus, diphtheria, and acellular pertussis (Tdap, Td) vaccine. You may need a Td booster every 10 years.  Zoster vaccine. You may need this after age 42.  Pneumococcal 13-valent conjugate (PCV13) vaccine. One dose is recommended after age 54.  Pneumococcal polysaccharide (PPSV23) vaccine. One dose is recommended after age 76. Talk to your health care provider about which screenings and vaccines you need and how often you need them. This information is not intended to replace advice given to you by your health care provider. Make sure you discuss any questions you have with your health care provider. Document Released: 12/20/2015 Document Revised: 08/12/2016 Document Reviewed: 09/24/2015 Elsevier Interactive Patient Education  2017 [employer].  Fall Prevention in the Home Falls can cause injuries. They can happen to people of all ages. There are many things you can do to make your home safe and to help prevent falls. What can I do on the outside of my home?  Regularly fix the edges of walkways and driveways and fix any cracks.  Remove anything that might make you trip as you walk through a door, such as a raised step or threshold.  Trim any bushes or trees on the path to your home.  Use bright outdoor lighting.  Clear any walking paths of anything that might make someone trip, such as rocks or tools.  Regularly check to see if handrails are loose or broken. Make sure that both sides of any steps have handrails.  Any raised decks and porches should have guardrails on the edges.  Have any leaves, snow, or ice cleared regularly.  Use sand or salt on walking paths during winter.  Clean up any spills in your garage right away. This includes oil or grease spills. What can I do in the bathroom?  Use night lights.  Install grab bars by the toilet and in the tub and  shower. Do not use towel bars as grab bars.  Use non-skid mats or decals in the tub or shower.  If you need to sit down in the shower, use a plastic, non-slip stool.  Keep the floor dry. Clean up any water that spills on the floor as soon as it happens.  Remove soap buildup in the tub or shower regularly.  Attach bath mats securely with double-sided non-slip rug tape.  Do not have throw rugs and other things on the floor that can make you trip. What can I do in the bedroom?  Use night lights.  Make sure that you have a light by your bed that is easy to reach.  Do not use any sheets or blankets that are too big for your bed. They should not hang down onto the floor.  Have a firm chair that has side arms. You can use this for support while you get dressed.  Do not have throw rugs and other things on the floor that can make you trip. What can I do in the kitchen?  Clean up any spills right away.  Avoid walking on wet floors.  Keep items that you use a lot in easy-to-reach places.  If you need to reach something above you, use  a strong step stool that has a grab bar.  Keep electrical cords out of the way.  Do not use floor polish or wax that makes floors slippery. If you must use wax, use non-skid floor wax.  Do not have throw rugs and other things on the floor that can make you trip. What can I do with my stairs?  Do not leave any items on the stairs.  Make sure that there are handrails on both sides of the stairs and use them. Fix handrails that are broken or loose. Make sure that handrails are as long as the stairways.  Check any carpeting to make sure that it is firmly attached to the stairs. Fix any carpet that is loose or worn.  Avoid having throw rugs at the top or bottom of the stairs. If you do have throw rugs, attach them to the floor with carpet tape.  Make sure that you have a light switch at the top of the stairs and the bottom of the stairs. If you do not  have them, ask someone to add them for you. What else can I do to help prevent falls?  Wear shoes that:  Do not have high heels.  Have rubber bottoms.  Are comfortable and fit you well.  Are closed at the toe. Do not wear sandals.  If you use a stepladder:  Make sure that it is fully opened. Do not climb a closed stepladder.  Make sure that both sides of the stepladder are locked into place.  Ask someone to hold it for you, if possible.  Clearly mark and make sure that you can see:  Any grab bars or handrails.  First and last steps.  Where the edge of each step is.  Use tools that help you move around (mobility aids) if they are needed. These include:  Canes.  Walkers.  Scooters.  Crutches.  Turn on the lights when you go into a dark area. Replace any light bulbs as soon as they burn out.  Set up your furniture so you have a clear path. Avoid moving your furniture around.  If any of your floors are uneven, fix them.  If there are any pets around you, be aware of where they are.  Review your medicines with your doctor. Some medicines can make you feel dizzy. This can increase your chance of falling. Ask your doctor what other things that you can do to help prevent falls. This information is not intended to replace advice given to you by your health care provider. Make sure you discuss any questions you have with your health care provider. Document Released: 09/19/2009 Document Revised: 04/30/2016 Document Reviewed: 12/28/2014 Elsevier Interactive Patient Education  2017 [name].

## 2021-04-01 ENCOUNTER — Encounter: Payer: Self-pay | Admitting: *Deleted

## 2021-05-12 ENCOUNTER — Other Ambulatory Visit: Payer: Self-pay | Admitting: Family Medicine

## 2021-05-12 ENCOUNTER — Ambulatory Visit: Payer: No Typology Code available for payment source | Admitting: Urology

## 2021-05-12 DIAGNOSIS — E039 Hypothyroidism, unspecified: Secondary | ICD-10-CM

## 2021-05-12 DIAGNOSIS — E785 Hyperlipidemia, unspecified: Secondary | ICD-10-CM

## 2021-05-12 DIAGNOSIS — M549 Dorsalgia, unspecified: Secondary | ICD-10-CM

## 2021-05-12 DIAGNOSIS — M4126 Other idiopathic scoliosis, lumbar region: Secondary | ICD-10-CM

## 2021-05-12 DIAGNOSIS — G8929 Other chronic pain: Secondary | ICD-10-CM

## 2021-05-12 DIAGNOSIS — N400 Enlarged prostate without lower urinary tract symptoms: Secondary | ICD-10-CM

## 2021-06-10 ENCOUNTER — Ambulatory Visit: Payer: Medicare Other | Admitting: Family Medicine

## 2021-06-12 ENCOUNTER — Ambulatory Visit: Payer: Medicare Other | Admitting: Family Medicine

## 2021-06-18 ENCOUNTER — Ambulatory Visit (INDEPENDENT_AMBULATORY_CARE_PROVIDER_SITE_OTHER): Payer: No Typology Code available for payment source | Admitting: Family Medicine

## 2021-06-18 ENCOUNTER — Encounter: Payer: Self-pay | Admitting: Family Medicine

## 2021-06-18 ENCOUNTER — Other Ambulatory Visit: Payer: Self-pay

## 2021-06-18 VITALS — BP 127/61 | HR 72 | Temp 98.0°F | Ht 71.0 in | Wt 189.6 lb

## 2021-06-18 DIAGNOSIS — M1611 Unilateral primary osteoarthritis, right hip: Secondary | ICD-10-CM

## 2021-06-18 DIAGNOSIS — N400 Enlarged prostate without lower urinary tract symptoms: Secondary | ICD-10-CM | POA: Diagnosis not present

## 2021-06-18 DIAGNOSIS — M1990 Unspecified osteoarthritis, unspecified site: Secondary | ICD-10-CM | POA: Diagnosis not present

## 2021-06-18 DIAGNOSIS — R3911 Hesitancy of micturition: Secondary | ICD-10-CM

## 2021-06-18 DIAGNOSIS — G8929 Other chronic pain: Secondary | ICD-10-CM

## 2021-06-18 DIAGNOSIS — N401 Enlarged prostate with lower urinary tract symptoms: Secondary | ICD-10-CM | POA: Diagnosis not present

## 2021-06-18 DIAGNOSIS — F419 Anxiety disorder, unspecified: Secondary | ICD-10-CM

## 2021-06-18 DIAGNOSIS — K219 Gastro-esophageal reflux disease without esophagitis: Secondary | ICD-10-CM

## 2021-06-18 DIAGNOSIS — M4126 Other idiopathic scoliosis, lumbar region: Secondary | ICD-10-CM

## 2021-06-18 DIAGNOSIS — M549 Dorsalgia, unspecified: Secondary | ICD-10-CM

## 2021-06-18 DIAGNOSIS — M4722 Other spondylosis with radiculopathy, cervical region: Secondary | ICD-10-CM

## 2021-06-18 MED ORDER — NABUMETONE 500 MG PO TABS: 1000.0000 mg | ORAL_TABLET | Freq: Two times a day (BID) | ORAL | 5 refills | Status: AC

## 2021-06-18 MED ORDER — GABAPENTIN 800 MG PO TABS: 1600.0000 mg | ORAL_TABLET | Freq: Two times a day (BID) | ORAL | 1 refills | Status: DC

## 2021-06-18 MED ORDER — CITALOPRAM HYDROBROMIDE 20 MG PO TABS: 20.0000 mg | ORAL_TABLET | Freq: Every day | ORAL | 1 refills | Status: AC

## 2021-06-18 MED ORDER — PANTOPRAZOLE SODIUM 40 MG PO TBEC: 40.0000 mg | DELAYED_RELEASE_TABLET | Freq: Every day | ORAL | 3 refills | Status: AC

## 2021-06-18 MED ORDER — TAMSULOSIN HCL 0.4 MG PO CAPS: 0.8000 mg | ORAL_CAPSULE | Freq: Every day | ORAL | 0 refills | Status: AC

## 2021-06-18 NOTE — Progress Notes (Signed)
Subjective:  Patient ID: Michael Mcclure, male    DOB: August 11, 1932  Age: 87 y.o. MRN: [id number]  CC: Medical Management of Chronic Issues   HPI COHAN STIPES presents for pain at the right ankle. Very sore ever since surgery 2-3 years ago. Does not want to go back to ortho. Already taking meds, nabumetone.  This is helping the left shoulder a lot, but not the ankle. The left knee causes pain too. Using an OTC gel also. Feels he can't do anything because he isn't able.  He is also taking gabapentin 1600 mg twice daily.  Patient has BPH.  He is currently taking tamsulosin with good result.  Up to the bathroom once sometimes twice a night.  Patient in for follow-up of GERD. Currently asymptomatic taking  PPI daily. There is no chest pain or heartburn. No hematemesis and no melena. No dysphagia or choking. Onset is remote. Progression is stable. Complicating factors, none.  Patient takes citalopram for depression with anxiety.  PHQ  scores below.   Depression screen [facility] 2/9 06/18/2021 06/18/2021 03/12/2021  Decreased Interest 3 0 0  Down, Depressed, Hopeless 0 0 0  PHQ - 2 Score 3 0 0  Altered sleeping 2 - -  Tired, decreased energy 0 - -  Change in appetite 0 - -  Feeling bad or failure about yourself  0 - -  Trouble concentrating 0 - -  Moving slowly or fidgety/restless 2 - -  Suicidal thoughts 0 - -  PHQ-9 Score 7 - -  Difficult doing work/chores Not difficult at all - -  Some recent data might be hidden    History Perseus has a past medical history of Actinic keratitis (2017), Anxiety, Arthritis, BPH (benign prostatic hyperplasia), CARPAL TUNNEL SYNDROME, HX OF (12/06/2008), Cataract, GERD (gastroesophageal reflux disease), Hemorrhoids, HOH (hard of hearing), colonic polyps, Hyperlipidemia, Hypothyroidism, Keratosis, seborrheic (2017), Pelvic fracture (HCC), and Thyroid disease.   He has a past surgical history that includes Back surgery; Wrist surgery (Left); Shoulder surgery  (Right); Rotator cuff repair; Carpal tunnel release (Right); Cataract extraction w/PHACO (Right, 05/18/2016); and Ankle surgery.   His family history includes Colon cancer in his mother; Colon polyps in his mother; Diabetes in his daughter and son; Emphysema in his father and son; Heart disease in his brother, brother, and father.He reports that he has never smoked. He has never used smokeless tobacco. He reports that he does not drink alcohol and does not use drugs.    ROS Review of Systems  Objective:  BP 127/61   Pulse 72   Temp 98 F (36.7 C)   Ht 5\' 11"  (1.803 m)   Wt 189 lb 9.6 oz (86 kg)   SpO2 96%   BMI 26.44 kg/m   BP Readings from Last 3 Encounters:  06/18/21 127/61  03/11/21 134/62  02/10/21 (!) 97/54    Wt Readings from Last 3 Encounters:  06/18/21 189 lb 9.6 oz (86 kg)  03/12/21 190 lb (86.2 kg)  03/11/21 194 lb 9.6 oz (88.3 kg)     Physical Exam    Assessment & Plan:   Camdyn was seen today for medical management of chronic issues.  Diagnoses and all orders for this visit:  Arthritis -     BMP8+EGFR  Gastroesophageal reflux disease, unspecified whether esophagitis present -     pantoprazole (PROTONIX) 40 MG tablet; Take 1 tablet (40 mg total) by mouth daily. -     BMP8+EGFR  Benign prostatic hyperplasia with  urinary hesitancy -     BMP8+EGFR  Benign prostatic hyperplasia, unspecified whether lower urinary tract symptoms present -     tamsulosin (FLOMAX) 0.4 MG CAPS capsule; Take 2 capsules (0.8 mg total) by mouth at bedtime. -     BMP8+EGFR  Cervical radiculopathy due to degenerative joint disease of spine -     nabumetone (RELAFEN) 500 MG tablet; Take 2 tablets (1,000 mg total) by mouth 2 (two) times daily. -     BMP8+EGFR  Chronic midline back pain, unspecified back location -     nabumetone (RELAFEN) 500 MG tablet; Take 2 tablets (1,000 mg total) by mouth 2 (two) times daily. -     gabapentin (NEURONTIN) 800 MG tablet; Take 2 tablets  (1,600 mg total) by mouth 2 (two) times daily. -     BMP8+EGFR  Other idiopathic scoliosis, lumbar region -     nabumetone (RELAFEN) 500 MG tablet; Take 2 tablets (1,000 mg total) by mouth 2 (two) times daily. -     gabapentin (NEURONTIN) 800 MG tablet; Take 2 tablets (1,600 mg total) by mouth 2 (two) times daily. -     BMP8+EGFR  Primary osteoarthritis of right hip -     nabumetone (RELAFEN) 500 MG tablet; Take 2 tablets (1,000 mg total) by mouth 2 (two) times daily. -     BMP8+EGFR  Anxiety -     citalopram (CELEXA) 20 MG tablet; Take 1 tablet (20 mg total) by mouth daily. -     BMP8+EGFR      I have discontinued Arlana Hove. Wareing's diclofenac. I have also changed his pantoprazole, gabapentin, and citalopram. Additionally, I am having him maintain his cholecalciferol, Flax Seed Oil, multivitamin with minerals, Xiidra, Spacer/Aero-Holding Chambers, albuterol, alendronate, triamcinolone ointment, Omega-3 Fatty Acids (FISH OIL PO), cetirizine, clopidogrel, ezetimibe-simvastatin, levothyroxine, tamsulosin, and nabumetone.  Allergies as of 06/18/2021       Reactions   Hydrocodone Shortness Of Breath   Ciprofloxacin Other (See Comments)   Question caused short of breath.        Medication List        Accurate as of June 18, 2021  5:38 PM. If you have any questions, ask your nurse or doctor.          STOP taking these medications    diclofenac 1.3 % Ptch Commonly known as: Flector Stopped by: Claretta Fraise, MD       TAKE these medications    albuterol 108 (90 Base) MCG/ACT inhaler Commonly known as: VENTOLIN HFA TAKE 2 PUFFS BY MOUTH EVERY 6 HOURS AS NEEDED FOR WHEEZE OR SHORTNESS OF BREATH   alendronate 70 MG tablet Commonly known as: FOSAMAX TAKE 1 TABLET BY MOUTH ONCE A WEEK WITH A FULL GLASS OF WATER ON AN EMPTY STOMACH.   cetirizine 10 MG tablet Commonly known as: ZYRTEC Take 1 tablet (10 mg total) by mouth daily. For allergy symptoms   cholecalciferol  1000 units tablet Commonly known as: VITAMIN D Take 1,000 Units by mouth daily.   citalopram 20 MG tablet Commonly known as: CELEXA Take 1 tablet (20 mg total) by mouth daily.   clopidogrel 75 MG tablet Commonly known as: PLAVIX TAKE 1 TABLET BY MOUTH EVERY DAY   ezetimibe-simvastatin 10-40 MG tablet Commonly known as: VYTORIN TAKE 1 TABLET BY MOUTH DAILY AT 6 PM.   FISH OIL PO Take by mouth.   Flax Seed Oil 1000 MG Caps Take 1 capsule by mouth daily.   gabapentin 800 MG tablet  Commonly known as: NEURONTIN Take 2 tablets (1,600 mg total) by mouth 2 (two) times daily.   levothyroxine 25 MCG tablet Commonly known as: SYNTHROID TAKE 1 TABLET BY MOUTH EVERY DAY   multivitamin with minerals tablet Take 1 tablet by mouth daily.   nabumetone 500 MG tablet Commonly known as: RELAFEN Take 2 tablets (1,000 mg total) by mouth 2 (two) times daily.   pantoprazole 40 MG tablet Commonly known as: PROTONIX Take 1 tablet (40 mg total) by mouth daily.   Spacer/Aero-Holding [employer] 1 Device by Does not apply route as needed.   tamsulosin 0.4 MG Caps capsule Commonly known as: FLOMAX Take 2 capsules (0.8 mg total) by mouth at bedtime.   triamcinolone ointment 0.5 % Commonly known as: KENALOG APPLY TOPICALLY TWICE DAILY AS NEEDED   Xiidra 5 % Soln Generic drug: Lifitegrast         Follow-up: Return in about 6 months (around 12/19/2021).  Claretta Fraise, M.D.

## 2021-06-19 LAB — BMP8+EGFR
BUN/Creatinine Ratio: 13 (ref 10–24)
BUN: 21 mg/dL (ref 8–27)
CO2: 21 mmol/L (ref 20–29)
Calcium: 9.7 mg/dL (ref 8.6–10.2)
Chloride: 103 mmol/L (ref 96–106)
Creatinine, Ser: 1.59 mg/dL — ABNORMAL HIGH (ref 0.76–1.27)
Glucose: 91 mg/dL (ref 65–99)
Potassium: 4.7 mmol/L (ref 3.5–5.2)
Sodium: 140 mmol/L (ref 134–144)
eGFR: 42 mL/min/{1.73_m2} — ABNORMAL LOW (ref >59)

## 2021-07-16 ENCOUNTER — Ambulatory Visit: Payer: Medicare Other | Admitting: Family Medicine

## 2021-07-23 ENCOUNTER — Telehealth: Payer: Self-pay | Admitting: Family Medicine

## 2021-07-23 DIAGNOSIS — M4126 Other idiopathic scoliosis, lumbar region: Secondary | ICD-10-CM

## 2021-07-23 DIAGNOSIS — E785 Hyperlipidemia, unspecified: Secondary | ICD-10-CM

## 2021-07-23 DIAGNOSIS — G8929 Other chronic pain: Secondary | ICD-10-CM

## 2021-07-23 MED ORDER — GABAPENTIN 800 MG PO TABS: 1600.0000 mg | ORAL_TABLET | Freq: Two times a day (BID) | ORAL | 0 refills | Status: DC

## 2021-07-23 MED ORDER — EZETIMIBE-SIMVASTATIN 10-40 MG PO TABS: 1.0000 | ORAL_TABLET | Freq: Every day | ORAL | 0 refills | Status: AC

## 2021-07-23 NOTE — Telephone Encounter (Signed)
Can you please send in Gabapentin to [facility] in [facility] for patient. It is controlled in [place].

## 2021-07-23 NOTE — Telephone Encounter (Signed)
  Prescription Request  07/23/2021  What is the name of the medication or equipment? gabapentin (NEURONTIN) 800 MG tablet  Have you contacted your pharmacy to request a refill? (if applicable) yes  Which pharmacy would you like this sent to? Needs sent to a [facility] in [place] because he is staying with his son after having surgery. Patient is aware he may not be able to get this medication called in and may need a telephone visit if it is possible to call it in. Please call to let patient know if medicine can be called in or not.    Patient notified that their request is being sent to the clinical staff for review and that they should receive a response within 2 business days.

## 2021-07-23 NOTE — Addendum Note (Signed)
Addended by: Tamera Punt on: 07/23/2021 10:35 AM   Modules accepted: Orders

## 2021-07-23 NOTE — Telephone Encounter (Signed)
I received verbal permission from Michael Mcclure to talk to [name]. Patient is currently in [place] with son and will be there longer than anticipated due to a procedure patient needs. He has requested [place] to send his refills to [facility] in [place]. Rx sent for patient.

## 2021-07-25 ENCOUNTER — Other Ambulatory Visit: Payer: Self-pay | Admitting: Family Medicine

## 2021-07-25 DIAGNOSIS — B9689 Other specified bacterial agents as the cause of diseases classified elsewhere: Secondary | ICD-10-CM

## 2021-07-25 DIAGNOSIS — G8929 Other chronic pain: Secondary | ICD-10-CM

## 2021-07-25 DIAGNOSIS — J988 Other specified respiratory disorders: Secondary | ICD-10-CM

## 2021-07-25 DIAGNOSIS — M4126 Other idiopathic scoliosis, lumbar region: Secondary | ICD-10-CM

## 2021-07-25 MED ORDER — GABAPENTIN 800 MG PO TABS: 1600.0000 mg | ORAL_TABLET | Freq: Two times a day (BID) | ORAL | 0 refills | Status: AC

## 2021-07-25 NOTE — Telephone Encounter (Signed)
Please let the patient know that I sent their prescription to their pharmacy. Thanks, WS 

## 2021-09-06 DEATH — deceased

## 2021-10-29 IMAGING — DX DG CERVICAL SPINE COMPLETE 4+V
5 series · 5 of 5 positions shown · non-contrast
Comparison: 06/13/2014

CLINICAL DATA: LEFT neck and shoulder pain

EXAM:
CERVICAL SPINE - COMPLETE 4+ VIEW

[c-spine lat]
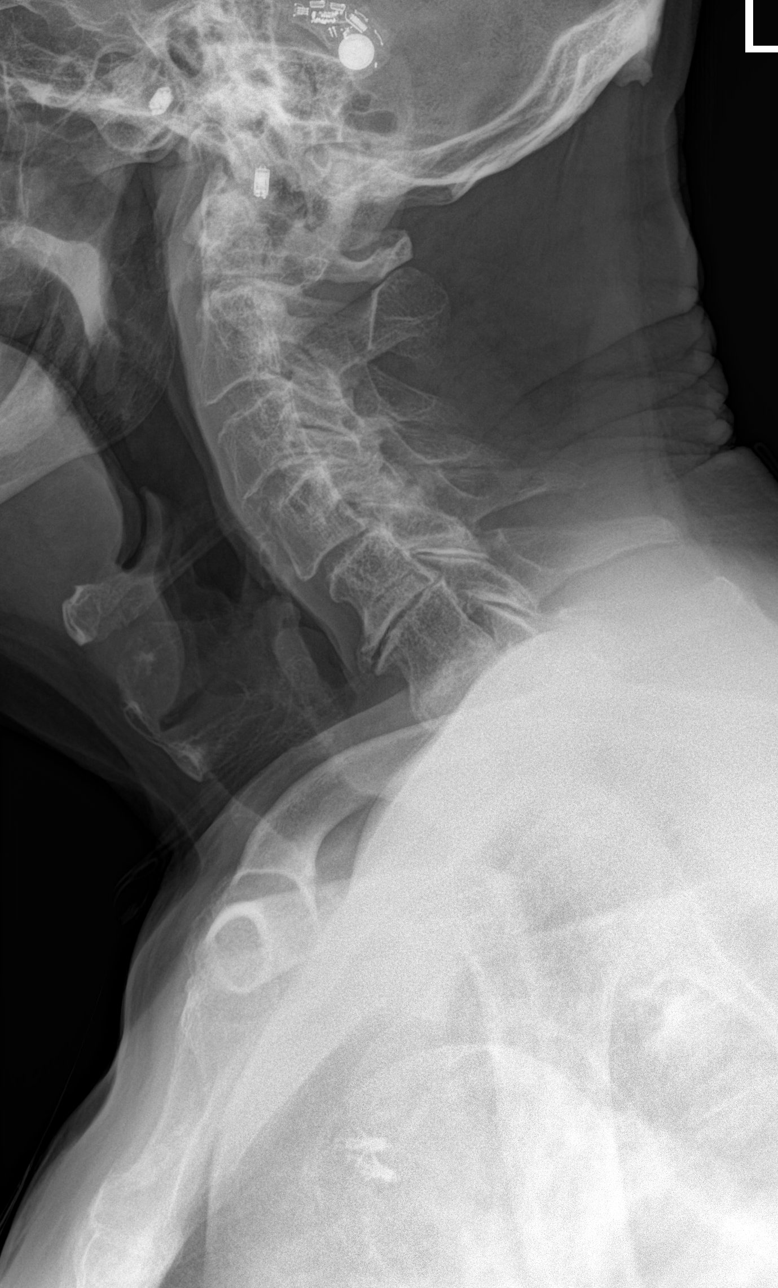

[c-spine obl (1 of 2)]
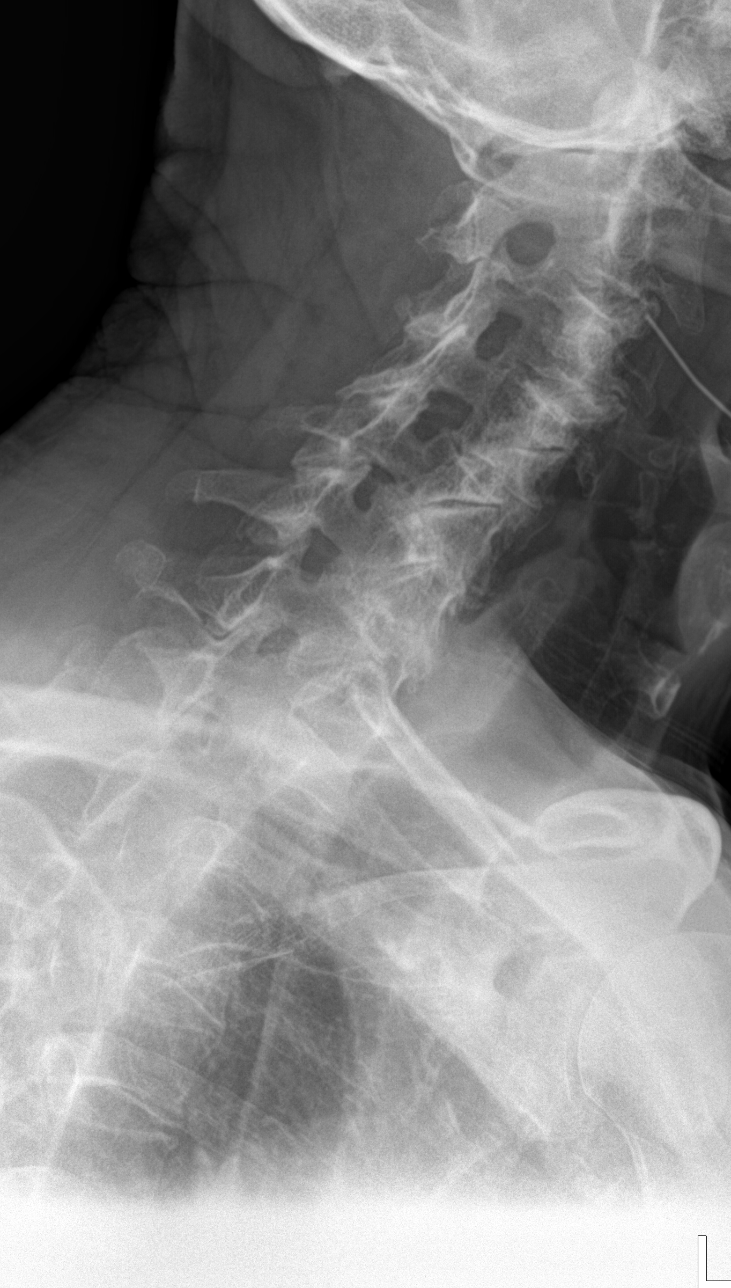

[c-spine obl (2 of 2)]
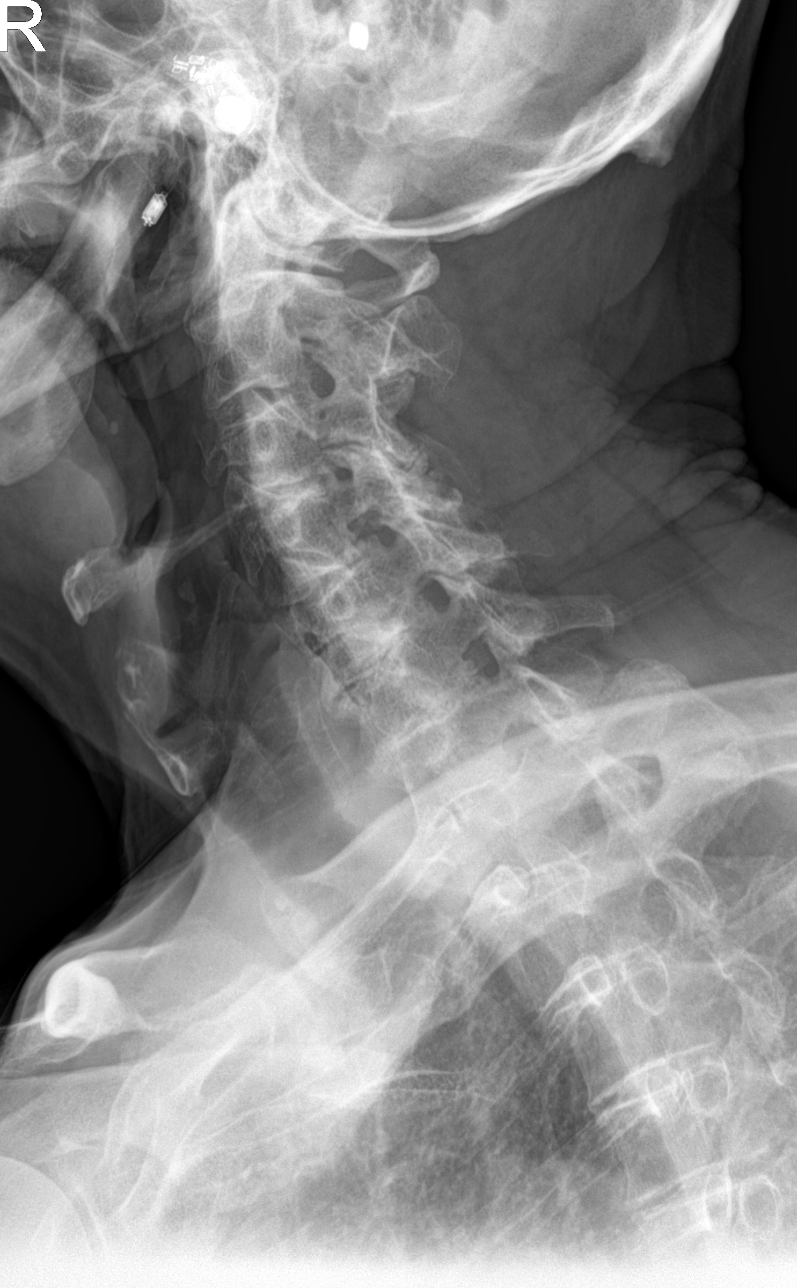

[c-spine ap]
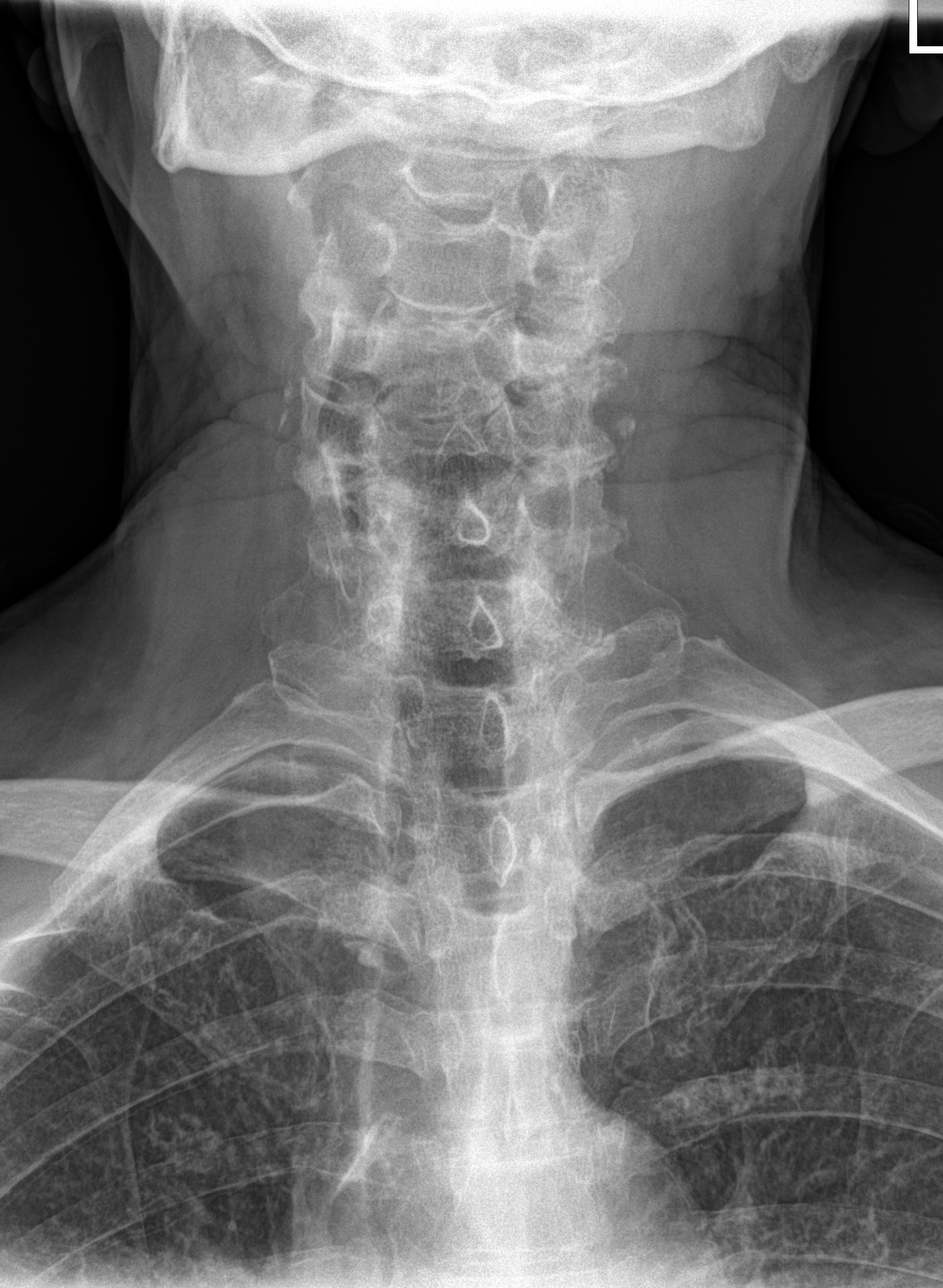

[c-spine open mouth]
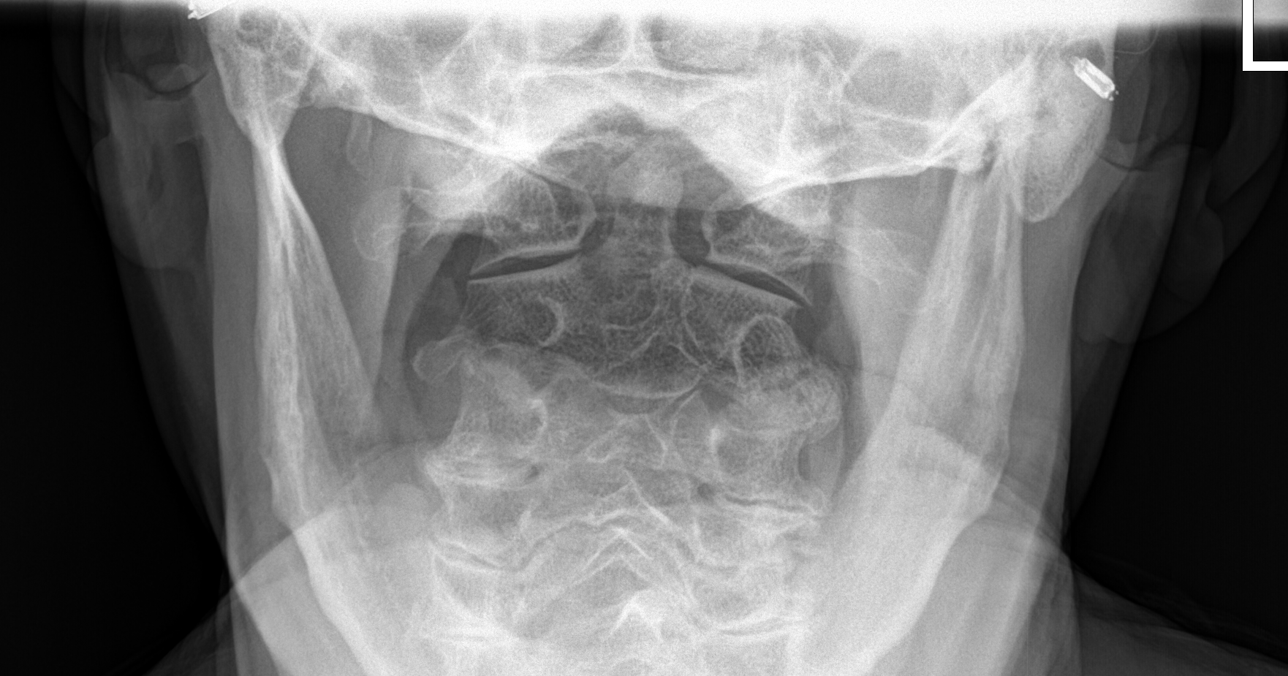

[5 of 5 positions shown; findings below may reference images not displayed]

FINDINGS: Osseous demineralization.

Prevertebral soft tissues normal thickness.

Multilevel facet degenerative changes.

Disc space narrowing and endplate spur formation at C5-C6 and C6-C7.

Additional disc space narrowing C4-C5 with minimal anterolisthesis.

No fracture, additional subluxation, or bone destruction.

Uncovertebral spurs encroach upon RIGHT C5-C6 and LEFT C4-C5 and
C6-C7 foramina.

Bulky facet spurs severely encroach upon LEFT C3-C4 neural foramen.

Lung apices clear.

Mild atherosclerotic calcifications within the carotid systems
bilaterally.
IMPRESSION: Degenerative disc and facet disease changes of the cervical spine.

Significant note of severe encroachment upon LEFT C3-C4 neural
foramen by bulky facet spurs.

## 2021-12-24 ENCOUNTER — Ambulatory Visit: Payer: Medicare Other | Admitting: Family Medicine

## 2022-03-13 ENCOUNTER — Ambulatory Visit: Payer: Medicare Other
